# Patient Record
Sex: Female | Born: 1945 | Race: White | Hispanic: No | Smoking: Former smoker
Health system: Southern US, Community
[De-identification: ages and names within clinical notes are randomized; demographics above are authoritative.]

## PROBLEM LIST (undated history)

## (undated) DIAGNOSIS — IMO0001 Reserved for inherently not codable concepts without codable children: Secondary | ICD-10-CM

## (undated) DIAGNOSIS — R918 Other nonspecific abnormal finding of lung field: Secondary | ICD-10-CM

## (undated) DIAGNOSIS — R062 Wheezing: Secondary | ICD-10-CM

## (undated) DIAGNOSIS — G629 Polyneuropathy, unspecified: Secondary | ICD-10-CM

## (undated) DIAGNOSIS — N189 Chronic kidney disease, unspecified: Secondary | ICD-10-CM

## (undated) DIAGNOSIS — I340 Nonrheumatic mitral (valve) insufficiency: Secondary | ICD-10-CM

## (undated) DIAGNOSIS — I2510 Atherosclerotic heart disease of native coronary artery without angina pectoris: Secondary | ICD-10-CM

## (undated) DIAGNOSIS — E785 Hyperlipidemia, unspecified: Secondary | ICD-10-CM

## (undated) DIAGNOSIS — I493 Ventricular premature depolarization: Secondary | ICD-10-CM

## (undated) DIAGNOSIS — I5189 Other ill-defined heart diseases: Secondary | ICD-10-CM

## (undated) DIAGNOSIS — D649 Anemia, unspecified: Secondary | ICD-10-CM

## (undated) DIAGNOSIS — J449 Chronic obstructive pulmonary disease, unspecified: Secondary | ICD-10-CM

## (undated) DIAGNOSIS — M1710 Unilateral primary osteoarthritis, unspecified knee: Secondary | ICD-10-CM

## (undated) DIAGNOSIS — Z8669 Personal history of other diseases of the nervous system and sense organs: Secondary | ICD-10-CM

## (undated) DIAGNOSIS — J45909 Unspecified asthma, uncomplicated: Secondary | ICD-10-CM

## (undated) DIAGNOSIS — I471 Supraventricular tachycardia, unspecified: Secondary | ICD-10-CM

## (undated) DIAGNOSIS — F329 Major depressive disorder, single episode, unspecified: Secondary | ICD-10-CM

## (undated) DIAGNOSIS — M8580 Other specified disorders of bone density and structure, unspecified site: Secondary | ICD-10-CM

## (undated) DIAGNOSIS — M179 Osteoarthritis of knee, unspecified: Secondary | ICD-10-CM

## (undated) DIAGNOSIS — F32A Depression, unspecified: Secondary | ICD-10-CM

## (undated) DIAGNOSIS — N182 Chronic kidney disease, stage 2 (mild): Secondary | ICD-10-CM

## (undated) DIAGNOSIS — R32 Unspecified urinary incontinence: Secondary | ICD-10-CM

## (undated) DIAGNOSIS — F410 Panic disorder [episodic paroxysmal anxiety] without agoraphobia: Secondary | ICD-10-CM

## (undated) DIAGNOSIS — K219 Gastro-esophageal reflux disease without esophagitis: Secondary | ICD-10-CM

## (undated) DIAGNOSIS — I10 Essential (primary) hypertension: Secondary | ICD-10-CM

## (undated) DIAGNOSIS — F419 Anxiety disorder, unspecified: Secondary | ICD-10-CM

## (undated) HISTORY — DX: Chronic kidney disease, unspecified: N18.9

## (undated) HISTORY — PX: BREAST ENHANCEMENT SURGERY: SHX7

## (undated) HISTORY — DX: Chronic obstructive pulmonary disease, unspecified: J44.9

## (undated) HISTORY — DX: Nonrheumatic mitral (valve) insufficiency: I34.0

## (undated) HISTORY — PX: JOINT REPLACEMENT: SHX530

## (undated) HISTORY — DX: Other nonspecific abnormal finding of lung field: R91.8

## (undated) HISTORY — PX: OOPHORECTOMY: SHX86

## (undated) HISTORY — DX: Depression, unspecified: F32.A

## (undated) HISTORY — DX: Osteoarthritis of knee, unspecified: M17.9

## (undated) HISTORY — DX: Polyneuropathy, unspecified: G62.9

## (undated) HISTORY — DX: Other specified disorders of bone density and structure, unspecified site: M85.80

## (undated) HISTORY — DX: Ventricular premature depolarization: I49.3

## (undated) HISTORY — DX: Personal history of other diseases of the nervous system and sense organs: Z86.69

## (undated) HISTORY — DX: Atherosclerotic heart disease of native coronary artery without angina pectoris: I25.10

## (undated) HISTORY — DX: Anemia, unspecified: D64.9

## (undated) HISTORY — DX: Anxiety disorder, unspecified: F41.9

## (undated) HISTORY — PX: INTRAOCULAR LENS INSERTION: SHX110

## (undated) HISTORY — DX: Chronic kidney disease, stage 2 (mild): N18.2

## (undated) HISTORY — DX: Other ill-defined heart diseases: I51.89

## (undated) HISTORY — DX: Supraventricular tachycardia, unspecified: I47.10

## (undated) HISTORY — DX: Essential (primary) hypertension: I10

## (undated) HISTORY — DX: Unilateral primary osteoarthritis, unspecified knee: M17.10

## (undated) HISTORY — DX: Gastro-esophageal reflux disease without esophagitis: K21.9

## (undated) HISTORY — PX: TUBAL LIGATION: SHX77

## (undated) HISTORY — PX: ABDOMINAL HYSTERECTOMY: SHX81

## (undated) HISTORY — DX: Major depressive disorder, single episode, unspecified: F32.9

## (undated) HISTORY — DX: Hyperlipidemia, unspecified: E78.5

---

## 1998-06-11 ENCOUNTER — Ambulatory Visit (HOSPITAL_COMMUNITY): Admission: RE | Admit: 1998-06-11 | Discharge: 1998-06-11 | Payer: Self-pay | Admitting: Neurosurgery

## 1998-06-11 ENCOUNTER — Encounter: Payer: Self-pay | Admitting: Neurosurgery

## 2009-06-13 HISTORY — PX: REPLACEMENT TOTAL KNEE BILATERAL: SUR1225

## 2010-06-10 ENCOUNTER — Ambulatory Visit: Payer: Self-pay | Admitting: Family Medicine

## 2010-06-11 ENCOUNTER — Ambulatory Visit: Payer: Self-pay | Admitting: Family Medicine

## 2010-06-16 ENCOUNTER — Ambulatory Visit: Payer: Self-pay | Admitting: Family Medicine

## 2010-06-21 ENCOUNTER — Ambulatory Visit: Payer: Self-pay | Admitting: Ophthalmology

## 2010-06-29 ENCOUNTER — Ambulatory Visit: Payer: Self-pay | Admitting: Ophthalmology

## 2010-07-01 ENCOUNTER — Ambulatory Visit: Payer: Self-pay | Admitting: General Practice

## 2010-07-14 ENCOUNTER — Inpatient Hospital Stay: Payer: Self-pay | Admitting: General Practice

## 2010-07-19 ENCOUNTER — Emergency Department: Payer: Self-pay | Admitting: Emergency Medicine

## 2010-07-29 ENCOUNTER — Ambulatory Visit: Payer: Self-pay | Admitting: Ophthalmology

## 2010-08-03 ENCOUNTER — Ambulatory Visit: Payer: Self-pay | Admitting: Ophthalmology

## 2010-12-09 ENCOUNTER — Ambulatory Visit: Payer: Self-pay | Admitting: General Practice

## 2010-12-22 ENCOUNTER — Inpatient Hospital Stay: Payer: Self-pay | Admitting: General Practice

## 2011-06-14 HISTORY — PX: EYE SURGERY: SHX253

## 2011-09-06 DIAGNOSIS — M5137 Other intervertebral disc degeneration, lumbosacral region: Secondary | ICD-10-CM | POA: Diagnosis not present

## 2011-12-06 DIAGNOSIS — M545 Low back pain: Secondary | ICD-10-CM | POA: Diagnosis not present

## 2011-12-06 DIAGNOSIS — IMO0002 Reserved for concepts with insufficient information to code with codable children: Secondary | ICD-10-CM | POA: Diagnosis not present

## 2011-12-06 DIAGNOSIS — Z96659 Presence of unspecified artificial knee joint: Secondary | ICD-10-CM | POA: Diagnosis not present

## 2011-12-20 ENCOUNTER — Ambulatory Visit: Payer: Self-pay | Admitting: General Practice

## 2011-12-20 DIAGNOSIS — M47817 Spondylosis without myelopathy or radiculopathy, lumbosacral region: Secondary | ICD-10-CM | POA: Diagnosis not present

## 2011-12-20 DIAGNOSIS — M545 Low back pain, unspecified: Secondary | ICD-10-CM | POA: Diagnosis not present

## 2011-12-20 DIAGNOSIS — M5030 Other cervical disc degeneration, unspecified cervical region: Secondary | ICD-10-CM | POA: Diagnosis not present

## 2011-12-20 DIAGNOSIS — IMO0002 Reserved for concepts with insufficient information to code with codable children: Secondary | ICD-10-CM | POA: Diagnosis not present

## 2011-12-20 DIAGNOSIS — M5137 Other intervertebral disc degeneration, lumbosacral region: Secondary | ICD-10-CM | POA: Diagnosis not present

## 2011-12-20 DIAGNOSIS — M51379 Other intervertebral disc degeneration, lumbosacral region without mention of lumbar back pain or lower extremity pain: Secondary | ICD-10-CM | POA: Diagnosis not present

## 2011-12-20 DIAGNOSIS — N289 Disorder of kidney and ureter, unspecified: Secondary | ICD-10-CM | POA: Diagnosis not present

## 2012-01-05 DIAGNOSIS — M545 Low back pain: Secondary | ICD-10-CM | POA: Diagnosis not present

## 2012-01-10 DIAGNOSIS — IMO0002 Reserved for concepts with insufficient information to code with codable children: Secondary | ICD-10-CM | POA: Diagnosis not present

## 2012-01-16 DIAGNOSIS — M545 Low back pain: Secondary | ICD-10-CM | POA: Diagnosis not present

## 2012-01-18 DIAGNOSIS — M545 Low back pain: Secondary | ICD-10-CM | POA: Diagnosis not present

## 2012-01-23 DIAGNOSIS — M545 Low back pain: Secondary | ICD-10-CM | POA: Diagnosis not present

## 2012-01-25 DIAGNOSIS — M545 Low back pain: Secondary | ICD-10-CM | POA: Diagnosis not present

## 2012-03-06 DIAGNOSIS — I10 Essential (primary) hypertension: Secondary | ICD-10-CM | POA: Diagnosis not present

## 2012-03-06 DIAGNOSIS — F329 Major depressive disorder, single episode, unspecified: Secondary | ICD-10-CM | POA: Diagnosis not present

## 2012-03-06 DIAGNOSIS — Z0000 Encounter for general adult medical examination without abnormal findings: Secondary | ICD-10-CM | POA: Diagnosis not present

## 2012-03-06 DIAGNOSIS — M949 Disorder of cartilage, unspecified: Secondary | ICD-10-CM | POA: Diagnosis not present

## 2012-03-06 DIAGNOSIS — M899 Disorder of bone, unspecified: Secondary | ICD-10-CM | POA: Diagnosis not present

## 2012-03-06 DIAGNOSIS — F411 Generalized anxiety disorder: Secondary | ICD-10-CM | POA: Diagnosis not present

## 2012-03-06 DIAGNOSIS — E7800 Pure hypercholesterolemia, unspecified: Secondary | ICD-10-CM | POA: Diagnosis not present

## 2012-03-06 DIAGNOSIS — Z23 Encounter for immunization: Secondary | ICD-10-CM | POA: Diagnosis not present

## 2012-06-07 ENCOUNTER — Ambulatory Visit: Payer: Self-pay | Admitting: Family Medicine

## 2012-06-07 DIAGNOSIS — Z1231 Encounter for screening mammogram for malignant neoplasm of breast: Secondary | ICD-10-CM | POA: Diagnosis not present

## 2012-07-05 DIAGNOSIS — M5137 Other intervertebral disc degeneration, lumbosacral region: Secondary | ICD-10-CM | POA: Diagnosis not present

## 2012-07-05 DIAGNOSIS — M545 Low back pain: Secondary | ICD-10-CM | POA: Diagnosis not present

## 2012-07-05 DIAGNOSIS — Z23 Encounter for immunization: Secondary | ICD-10-CM | POA: Diagnosis not present

## 2012-07-05 DIAGNOSIS — F329 Major depressive disorder, single episode, unspecified: Secondary | ICD-10-CM | POA: Diagnosis not present

## 2012-07-05 DIAGNOSIS — Z79899 Other long term (current) drug therapy: Secondary | ICD-10-CM | POA: Diagnosis not present

## 2012-07-05 DIAGNOSIS — R072 Precordial pain: Secondary | ICD-10-CM | POA: Diagnosis not present

## 2012-10-25 DIAGNOSIS — Z79899 Other long term (current) drug therapy: Secondary | ICD-10-CM | POA: Diagnosis not present

## 2012-10-25 DIAGNOSIS — Z791 Long term (current) use of non-steroidal anti-inflammatories (NSAID): Secondary | ICD-10-CM | POA: Diagnosis not present

## 2012-10-25 DIAGNOSIS — M5137 Other intervertebral disc degeneration, lumbosacral region: Secondary | ICD-10-CM | POA: Diagnosis not present

## 2012-10-29 DIAGNOSIS — R6884 Jaw pain: Secondary | ICD-10-CM | POA: Diagnosis not present

## 2012-10-29 DIAGNOSIS — M279 Disease of jaws, unspecified: Secondary | ICD-10-CM | POA: Diagnosis not present

## 2013-01-28 DIAGNOSIS — M545 Low back pain: Secondary | ICD-10-CM | POA: Diagnosis not present

## 2013-01-28 DIAGNOSIS — Z0289 Encounter for other administrative examinations: Secondary | ICD-10-CM | POA: Diagnosis not present

## 2013-03-12 ENCOUNTER — Emergency Department: Payer: Self-pay | Admitting: Emergency Medicine

## 2013-03-12 DIAGNOSIS — Z87891 Personal history of nicotine dependence: Secondary | ICD-10-CM | POA: Diagnosis not present

## 2013-03-12 DIAGNOSIS — Z043 Encounter for examination and observation following other accident: Secondary | ICD-10-CM | POA: Diagnosis not present

## 2013-03-12 DIAGNOSIS — M545 Low back pain: Secondary | ICD-10-CM | POA: Diagnosis not present

## 2013-03-12 DIAGNOSIS — T07XXXA Unspecified multiple injuries, initial encounter: Secondary | ICD-10-CM | POA: Diagnosis not present

## 2013-03-12 DIAGNOSIS — M11279 Other chondrocalcinosis, unspecified ankle and foot: Secondary | ICD-10-CM | POA: Diagnosis not present

## 2013-03-12 DIAGNOSIS — T148XXA Other injury of unspecified body region, initial encounter: Secondary | ICD-10-CM | POA: Diagnosis not present

## 2013-03-12 DIAGNOSIS — S139XXA Sprain of joints and ligaments of unspecified parts of neck, initial encounter: Secondary | ICD-10-CM | POA: Diagnosis not present

## 2013-03-21 ENCOUNTER — Emergency Department: Payer: Self-pay | Admitting: Emergency Medicine

## 2013-03-21 DIAGNOSIS — F17200 Nicotine dependence, unspecified, uncomplicated: Secondary | ICD-10-CM | POA: Diagnosis not present

## 2013-03-21 DIAGNOSIS — Z79899 Other long term (current) drug therapy: Secondary | ICD-10-CM | POA: Diagnosis not present

## 2013-03-21 DIAGNOSIS — Z9079 Acquired absence of other genital organ(s): Secondary | ICD-10-CM | POA: Diagnosis not present

## 2013-03-21 DIAGNOSIS — M5430 Sciatica, unspecified side: Secondary | ICD-10-CM | POA: Diagnosis not present

## 2013-03-21 DIAGNOSIS — Z96659 Presence of unspecified artificial knee joint: Secondary | ICD-10-CM | POA: Diagnosis not present

## 2013-03-26 DIAGNOSIS — Z23 Encounter for immunization: Secondary | ICD-10-CM | POA: Diagnosis not present

## 2013-03-26 DIAGNOSIS — M545 Low back pain: Secondary | ICD-10-CM | POA: Diagnosis not present

## 2013-05-01 DIAGNOSIS — M545 Low back pain: Secondary | ICD-10-CM | POA: Diagnosis not present

## 2013-05-01 DIAGNOSIS — E7800 Pure hypercholesterolemia, unspecified: Secondary | ICD-10-CM | POA: Diagnosis not present

## 2013-05-01 DIAGNOSIS — Z0000 Encounter for general adult medical examination without abnormal findings: Secondary | ICD-10-CM | POA: Diagnosis not present

## 2013-08-14 DIAGNOSIS — M545 Low back pain, unspecified: Secondary | ICD-10-CM | POA: Diagnosis not present

## 2013-12-10 DIAGNOSIS — M545 Low back pain, unspecified: Secondary | ICD-10-CM | POA: Diagnosis not present

## 2014-03-06 DIAGNOSIS — F411 Generalized anxiety disorder: Secondary | ICD-10-CM | POA: Diagnosis not present

## 2014-03-06 DIAGNOSIS — IMO0002 Reserved for concepts with insufficient information to code with codable children: Secondary | ICD-10-CM | POA: Diagnosis not present

## 2014-03-06 DIAGNOSIS — Z23 Encounter for immunization: Secondary | ICD-10-CM | POA: Diagnosis not present

## 2014-03-06 DIAGNOSIS — M1710 Unilateral primary osteoarthritis, unspecified knee: Secondary | ICD-10-CM | POA: Diagnosis not present

## 2014-03-06 DIAGNOSIS — M545 Low back pain, unspecified: Secondary | ICD-10-CM | POA: Diagnosis not present

## 2014-03-06 DIAGNOSIS — E7800 Pure hypercholesterolemia, unspecified: Secondary | ICD-10-CM | POA: Diagnosis not present

## 2014-05-28 DIAGNOSIS — E780 Pure hypercholesterolemia: Secondary | ICD-10-CM | POA: Diagnosis not present

## 2014-05-28 DIAGNOSIS — M8580 Other specified disorders of bone density and structure, unspecified site: Secondary | ICD-10-CM | POA: Diagnosis not present

## 2014-05-28 DIAGNOSIS — F341 Dysthymic disorder: Secondary | ICD-10-CM | POA: Diagnosis not present

## 2014-05-28 DIAGNOSIS — F419 Anxiety disorder, unspecified: Secondary | ICD-10-CM | POA: Diagnosis not present

## 2014-05-28 DIAGNOSIS — M961 Postlaminectomy syndrome, not elsewhere classified: Secondary | ICD-10-CM | POA: Diagnosis not present

## 2014-05-28 DIAGNOSIS — I10 Essential (primary) hypertension: Secondary | ICD-10-CM | POA: Diagnosis not present

## 2014-05-28 DIAGNOSIS — Z0000 Encounter for general adult medical examination without abnormal findings: Secondary | ICD-10-CM | POA: Diagnosis not present

## 2014-05-28 DIAGNOSIS — M179 Osteoarthritis of knee, unspecified: Secondary | ICD-10-CM | POA: Diagnosis not present

## 2014-07-21 DIAGNOSIS — G629 Polyneuropathy, unspecified: Secondary | ICD-10-CM | POA: Diagnosis not present

## 2014-07-21 DIAGNOSIS — I10 Essential (primary) hypertension: Secondary | ICD-10-CM | POA: Diagnosis not present

## 2014-07-21 DIAGNOSIS — D649 Anemia, unspecified: Secondary | ICD-10-CM | POA: Diagnosis not present

## 2014-07-21 DIAGNOSIS — R7989 Other specified abnormal findings of blood chemistry: Secondary | ICD-10-CM | POA: Diagnosis not present

## 2014-07-21 DIAGNOSIS — N182 Chronic kidney disease, stage 2 (mild): Secondary | ICD-10-CM | POA: Diagnosis not present

## 2014-08-06 DIAGNOSIS — J0140 Acute pansinusitis, unspecified: Secondary | ICD-10-CM | POA: Diagnosis not present

## 2014-08-06 DIAGNOSIS — R8299 Other abnormal findings in urine: Secondary | ICD-10-CM | POA: Diagnosis not present

## 2014-09-18 DIAGNOSIS — I10 Essential (primary) hypertension: Secondary | ICD-10-CM | POA: Diagnosis not present

## 2014-09-18 DIAGNOSIS — E780 Pure hypercholesterolemia: Secondary | ICD-10-CM | POA: Diagnosis not present

## 2014-09-18 DIAGNOSIS — M961 Postlaminectomy syndrome, not elsewhere classified: Secondary | ICD-10-CM | POA: Diagnosis not present

## 2014-10-21 ENCOUNTER — Encounter: Payer: Self-pay | Admitting: Pain Medicine

## 2014-10-21 ENCOUNTER — Encounter (INDEPENDENT_AMBULATORY_CARE_PROVIDER_SITE_OTHER): Payer: Self-pay

## 2014-10-21 ENCOUNTER — Ambulatory Visit: Payer: Medicare Other | Attending: Pain Medicine | Admitting: Pain Medicine

## 2014-10-21 VITALS — BP 139/64 | HR 85 | Temp 98.0°F | Resp 16 | Ht 64.0 in | Wt 200.0 lb

## 2014-10-21 DIAGNOSIS — M5136 Other intervertebral disc degeneration, lumbar region: Secondary | ICD-10-CM | POA: Diagnosis not present

## 2014-10-21 DIAGNOSIS — M1288 Other specific arthropathies, not elsewhere classified, other specified site: Secondary | ICD-10-CM | POA: Diagnosis not present

## 2014-10-21 DIAGNOSIS — M47899 Other spondylosis, site unspecified: Secondary | ICD-10-CM

## 2014-10-21 DIAGNOSIS — M47818 Spondylosis without myelopathy or radiculopathy, sacral and sacrococcygeal region: Secondary | ICD-10-CM

## 2014-10-21 DIAGNOSIS — I10 Essential (primary) hypertension: Secondary | ICD-10-CM | POA: Insufficient documentation

## 2014-10-21 DIAGNOSIS — Z87891 Personal history of nicotine dependence: Secondary | ICD-10-CM | POA: Diagnosis not present

## 2014-10-21 DIAGNOSIS — M5030 Other cervical disc degeneration, unspecified cervical region: Secondary | ICD-10-CM

## 2014-10-21 DIAGNOSIS — M47819 Spondylosis without myelopathy or radiculopathy, site unspecified: Secondary | ICD-10-CM

## 2014-10-21 DIAGNOSIS — M5481 Occipital neuralgia: Secondary | ICD-10-CM | POA: Diagnosis not present

## 2014-10-21 DIAGNOSIS — M1990 Unspecified osteoarthritis, unspecified site: Secondary | ICD-10-CM | POA: Diagnosis not present

## 2014-10-21 DIAGNOSIS — M461 Sacroiliitis, not elsewhere classified: Secondary | ICD-10-CM | POA: Insufficient documentation

## 2014-10-21 DIAGNOSIS — M542 Cervicalgia: Secondary | ICD-10-CM | POA: Diagnosis present

## 2014-10-21 NOTE — Patient Instructions (Addendum)
Recommend that patient return to referring physician to discuss referral to tertiary pain clinic     Have discussed patient's condition and patient may call next week to discuss further treatment.. As discussed with the patient we may consider treating the patient after review of additional records and discussion of patient's condition. The patient understands that she may need to have further evaluation and treatment at tertiary center after we review additional information and that we may not be able to offer patient treatment.

## 2014-10-21 NOTE — Progress Notes (Signed)
   Subjective:    Patient ID: Patricia Gomez, female    DOB: 01/05/1946, 69 y.o.   MRN: [id number]  HPI    Review of Systems     Objective:   Physical Exam        Assessment & Plan:

## 2014-10-21 NOTE — Progress Notes (Signed)
Patient is a 69 year old female who comes to pain management Center for further evaluation and treatment of pain involving the lumbar extremity region with pain of the cervical region as well a lesser degree. Patient is with prior evaluation and treatment at [facility] and has been under care of her primary care physician Dr. Dossie Arbourrissman for several years after having left [facility]. Patient gives history of having fallen several times which she attributes to slipping off a ladder and other falls due to loss of balance patient states that the pain is an aching cramping sensation with feeling of weight tingling stabbing sharp shooting sensation. Patient states that the pain awakens her from sleep associated with weakness tingling swelling spasms numbness nausea fatigue and nighttime cramps in daytime cramps. Patient states that the pain increases with bending climbing kneeling lifting standing squatting stooping twisting walking uphill and downhill. Pain is improved with walking, packs hot Down and medications. We expressed concern to patient regarding patient's having several falls and recommended that patient be considered for further evaluation at a tertiary pain clinic with additional records and may consider treatment at our pain management Center is discussed with patient on today's visit as well. Additional records and discuss patient's condition and may consider treatment at this time patient is to return to Dr. Dossie Arbourrissman to discuss referral to tertiary pain clinic for evaluation and treatment of her condition.  Allergies no known drug allergies  Medications as listed  Patient Active Problem List   Diagnosis Date Noted  . DDD (degenerative disc disease), cervical 10/21/2014  . DDD (degenerative disc disease), lumbar 10/21/2014  . Facet syndrome 10/21/2014  . Degenerative joint disease of sacroiliac joint 10/21/2014  . Bilateral occipital neuralgia 10/21/2014     Review of  systems  Cardiovascular Daily aspirin, hypertension, chest pain  Pulmonary Lung problems, shortness of breath  Neurological Unremarkable  Psychological Panic attacks, his treatment of having been abused  Gastrointestinal Reflux or heartburn  Genitourinary Kidney disease  Hematological Unremarkable  Endocrine Remarkable  Rheumatological Remarkable  Musculoskeletal Unremarkable  Significant fine unremarkable  Social history  Divorced with 3 children Positive cigarette smoking cessation 5 years  Work history Retired  Physical examination  See vital signs  There was tenderness to palpation in the paraspinal musculature region of the cervical region cervical facet region of mild degree with mild tenderness of the splenius capitis and occipitalis  regions. Palpation of the cervical facet and cervical paraspinal musculature regions reproduced pain of mild to moderate degree. There was mild to moderate tenderness of the acromioclavicular and glenohumeral joint region. Strength type slightly decreased. Tinel's and Phalen's maneuver without increased pain of any significant degree is evidence of muscle spasm in the lower thoracic paraspinal musculature region. Patient over the lumbar paraspinal musculature region lumbar facet region reproduced moderate discomfort. Tendon rotation associated with moderate discomfort. Straight leg raising tolerates approximately 30. No definite increased pain with dorsiflexion noted. Negative clonus negative Homans. Mild tenderness of the knees noticed without increased warmth and erythema of the knees noted. Mild tenderness of the greater trochanteric region noted as well as iliotibial band. Abdomen without tenderness to palpation and no costovertebral tenderness was noted.  Assessment  Degenerative disc disease lumbar spine with facet syndrome  Sacroiliac joint dysfunction  Degenerative joint disease of knees. Prior surgical  intervention  Occipital neuralgia  Plan  At this time recommend patient return to Dr. Dossie Arbourrissman to discuss referral to tertiary pain clinic for further evaluation and treatment. We will  review additional records consider treatment of patient as discussed with patient. At the present time we wish to have patient return to Dr. Dossie Arbourrissman to discuss referral to tertiary pain clinic to avoid delay inpatient straight.. Pending  evaluation by tertiary pain clinic we may consider patient for further treatment in a pain management center. He was understanding and agreement with suggested treatment plan.

## 2014-10-21 NOTE — Progress Notes (Signed)
Patient discharged to home at  1410 hrs.

## 2014-10-23 NOTE — Addendum Note (Signed)
Addended by: Ronan Dion M on: 10/23/2014 12:57 PM   Modules accepted: Orders  

## 2014-11-05 ENCOUNTER — Other Ambulatory Visit: Payer: Self-pay | Admitting: Pain Medicine

## 2014-11-13 ENCOUNTER — Telehealth: Payer: Self-pay

## 2014-11-13 ENCOUNTER — Telehealth: Payer: Self-pay | Admitting: Family Medicine

## 2014-11-13 MED ORDER — DIAZEPAM 5 MG PO TABS: 5.0000 mg | ORAL_TABLET | Freq: Every day | ORAL | Status: DC

## 2014-11-13 NOTE — Telephone Encounter (Signed)
Gave rx refill

## 2014-11-13 NOTE — Telephone Encounter (Signed)
Pt needs her Tramadol with her other 28 day meds.  She is in waiting room.

## 2014-11-13 NOTE — Telephone Encounter (Signed)
Patient would like Refill of Diazepam

## 2014-11-25 ENCOUNTER — Telehealth: Payer: Self-pay | Admitting: Family Medicine

## 2014-12-08 ENCOUNTER — Other Ambulatory Visit: Payer: Self-pay

## 2014-12-09 ENCOUNTER — Other Ambulatory Visit: Payer: Self-pay | Admitting: Family Medicine

## 2014-12-09 MED ORDER — OXYCODONE-ACETAMINOPHEN 5-325 MG PO TABS: 1.0000 | ORAL_TABLET | Freq: Four times a day (QID) | ORAL | Status: DC | PRN

## 2014-12-09 MED ORDER — TRAMADOL HCL 50 MG PO TABS: 50.0000 mg | ORAL_TABLET | Freq: Four times a day (QID) | ORAL | Status: DC | PRN

## 2014-12-16 ENCOUNTER — Encounter: Payer: Self-pay | Admitting: Family Medicine

## 2014-12-16 ENCOUNTER — Ambulatory Visit (INDEPENDENT_AMBULATORY_CARE_PROVIDER_SITE_OTHER): Payer: Medicare Other | Admitting: Family Medicine

## 2014-12-16 VITALS — BP 137/81 | HR 66 | Temp 98.0°F | Ht 62.2 in | Wt 196.0 lb

## 2014-12-16 DIAGNOSIS — N9489 Other specified conditions associated with female genital organs and menstrual cycle: Secondary | ICD-10-CM | POA: Diagnosis not present

## 2014-12-16 DIAGNOSIS — N952 Postmenopausal atrophic vaginitis: Secondary | ICD-10-CM | POA: Diagnosis not present

## 2014-12-16 DIAGNOSIS — N898 Other specified noninflammatory disorders of vagina: Secondary | ICD-10-CM

## 2014-12-16 MED ORDER — ALBUTEROL SULFATE HFA 108 (90 BASE) MCG/ACT IN AERS: 2.0000 | INHALATION_SPRAY | Freq: Four times a day (QID) | RESPIRATORY_TRACT | Status: DC | PRN

## 2014-12-16 MED ORDER — METRONIDAZOLE 500 MG PO TABS: 500.0000 mg | ORAL_TABLET | Freq: Two times a day (BID) | ORAL | Status: DC

## 2014-12-16 NOTE — Patient Instructions (Signed)
Pelvic Pain Pelvic pain is pain felt below the belly button and between your hips. It can be caused by many different things. It is important to get help right away. This is especially true for severe, sharp, or unusual pain that comes on suddenly.  HOME CARE  Only take medicine as told by your doctor.  Rest as told by your doctor.  Eat a healthy diet, such as fruits, vegetables, and lean meats.  Drink enough fluids to keep your pee (urine) clear or pale yellow, or as told.  Avoid sex (intercourse) if it causes pain.  Apply warm or cold packs to your lower belly (abdomen). Use the type of pack that helps the pain.  Avoid situations that cause you stress.  Keep a journal to track your pain. Write down:  When the pain started.  Where it is located.  If there are things that seem to be related to the pain, such as food or your period.  Follow up with your doctor as told. GET HELP RIGHT AWAY IF:   You have heavy bleeding from the vagina.  You have more pelvic pain.  You feel lightheaded or pass out (faint).  You have chills.  You have pain when you pee or have blood in your pee.  You cannot stop having watery poop (diarrhea).  You cannot stop throwing up (vomiting).  You have a fever or lasting symptoms for more than 3 days.  You have a fever and your symptoms suddenly get worse.  You are being physically or sexually abused.  Your medicine does not help your pain.  You have fluid (discharge) coming from your vagina that is not normal. MAKE SURE YOU:  Understand these instructions.  Will watch your condition.  Will get help if you are not doing well or get worse. Document Released: 11/16/2007 Document Revised: 11/29/2011 Document Reviewed: 09/19/2011 Exit[employer] Patient Information 2015 [employer]. This information is not intended to replace advice given to you by your health care provider. Make sure you discuss any questions you have with your health care  provider.  

## 2014-12-16 NOTE — Assessment & Plan Note (Signed)
Likely the cause of her odor and her discomfort. Will try OTC lubricants. If not improving, will consider vaginal estrogen to try to get this under control. Continue to monitor.

## 2014-12-16 NOTE — Progress Notes (Signed)
BP 137/81 mmHg  Pulse 66  Temp(Src) 98 F (36.7 C)  Ht 5' 2.2" (1.58 m)  Wt 196 lb (88.905 kg)  BMI 35.61 kg/m2  SpO2 95%   Subjective:    Patient ID: Patricia Gomez, female    DOB: 10/06/1945, 69 y.o.   MRN: [id number]  HPI: Patricia CorinRebecca Ann Schnurr is a 69 y.o. female  Chief Complaint  Patient presents with  . vaginal odor    X 6months   VAGINAL ATROPHY- feels "like a rose bush" very prickly and painful Duration: 6 months Vaginal dryness: no Vaginal itching: no Dyspareunia: no Vaginal burning: no Vaginal discharge: no Dysuria: no Vaginal bleeding/spotting: no Symptom severity: moderate Urinary frequency: no Urgency: no Hematuria: no Hot flashes: no Fevers: no Vomiting: no Sexual activity: not sexually active History of sexually transmitted disease: no Postmenopause: yes Relief with lubricants: not using Status: severe Treatments attempted: none  Relevant past medical, surgical, family and social history reviewed and updated as indicated. Interim medical history since our last visit reviewed. Allergies and medications reviewed and updated.  Review of Systems  Constitutional: Negative.   Respiratory: Negative.   Cardiovascular: Negative.   Gastrointestinal: Negative.   Genitourinary: Positive for pelvic pain. Negative for dysuria, urgency, frequency, hematuria, flank pain, vaginal bleeding, vaginal discharge, enuresis, difficulty urinating, genital sores, vaginal pain, menstrual problem and dyspareunia.  Psychiatric/Behavioral: Negative.     Per HPI unless specifically indicated above     Objective:    BP 137/81 mmHg  Pulse 66  Temp(Src) 98 F (36.7 C)  Ht 5' 2.2" (1.58 m)  Wt 196 lb (88.905 kg)  BMI 35.61 kg/m2  SpO2 95%  Wt Readings from Last 3 Encounters:  12/16/14 196 lb (88.905 kg)  10/21/14 200 lb (90.719 kg)    Physical Exam  Constitutional: She is oriented to person, place, and time. She appears well-developed and well-nourished. No  distress.  HENT:  Head: Normocephalic and atraumatic.  Right Ear: Hearing normal.  Left Ear: Hearing normal.  Nose: Nose normal.  Eyes: Conjunctivae and lids are normal. Right eye exhibits no discharge. Left eye exhibits no discharge. No scleral icterus.  Pulmonary/Chest: Effort normal. No respiratory distress.  Genitourinary: Pelvic exam was performed with patient supine. There is tenderness in the vagina. No erythema or bleeding in the vagina. No foreign body around the vagina. No signs of injury around the vagina. No vaginal discharge found.  Very dry, atrophic, foul odor  Musculoskeletal: Normal range of motion.  Neurological: She is alert and oriented to person, place, and time.  Skin: Skin is warm, dry and intact. No rash noted. No erythema. No pallor.  Psychiatric: She has a normal mood and affect. Her speech is normal and behavior is normal. Judgment and thought content normal. Cognition and memory are normal.  Nursing note and vitals reviewed.   No results found for this or any previous visit.    Assessment & Plan:   Problem List Items Addressed This Visit      Genitourinary   Vaginal atrophy    Likely the cause of her odor and her discomfort. Will try OTC lubricants. If not improving, will consider vaginal estrogen to try to get this under control. Continue to monitor.        Other Visit Diagnoses    Vaginal odor    -  Primary    Despite negative wet prep, odor will have us treat for BV. Rx sent to her pharmacy today. Call if not getting better or  getting worse.     Relevant Orders    WET PREP FOR TRICH, YEAST, CLUE        Follow up plan: Return for As scheduled with Dr. Dossie Arbour.

## 2014-12-17 LAB — WET PREP FOR TRICH, YEAST, CLUE
Clue Cell Exam: NEGATIVE
TRICHOMONAS EXAM: NEGATIVE
Yeast Exam: NEGATIVE

## 2014-12-26 ENCOUNTER — Telehealth: Payer: Self-pay

## 2014-12-26 NOTE — Telephone Encounter (Signed)
Patient states she is coming in to get her medications but will send her grandson in to pick them up.  Patient states that she has been throwing up all night.

## 2014-12-30 DIAGNOSIS — I10 Essential (primary) hypertension: Secondary | ICD-10-CM | POA: Insufficient documentation

## 2014-12-30 DIAGNOSIS — G629 Polyneuropathy, unspecified: Secondary | ICD-10-CM | POA: Insufficient documentation

## 2014-12-30 DIAGNOSIS — M961 Postlaminectomy syndrome, not elsewhere classified: Secondary | ICD-10-CM | POA: Insufficient documentation

## 2014-12-30 DIAGNOSIS — M8580 Other specified disorders of bone density and structure, unspecified site: Secondary | ICD-10-CM | POA: Insufficient documentation

## 2014-12-30 DIAGNOSIS — F329 Major depressive disorder, single episode, unspecified: Secondary | ICD-10-CM | POA: Insufficient documentation

## 2014-12-30 DIAGNOSIS — D649 Anemia, unspecified: Secondary | ICD-10-CM | POA: Insufficient documentation

## 2014-12-30 DIAGNOSIS — E785 Hyperlipidemia, unspecified: Secondary | ICD-10-CM | POA: Insufficient documentation

## 2014-12-30 DIAGNOSIS — F32A Depression, unspecified: Secondary | ICD-10-CM | POA: Insufficient documentation

## 2014-12-30 DIAGNOSIS — M179 Osteoarthritis of knee, unspecified: Secondary | ICD-10-CM | POA: Insufficient documentation

## 2014-12-30 DIAGNOSIS — M1710 Unilateral primary osteoarthritis, unspecified knee: Secondary | ICD-10-CM | POA: Insufficient documentation

## 2014-12-30 DIAGNOSIS — N189 Chronic kidney disease, unspecified: Secondary | ICD-10-CM | POA: Insufficient documentation

## 2014-12-31 ENCOUNTER — Encounter: Payer: Self-pay | Admitting: Family Medicine

## 2014-12-31 ENCOUNTER — Ambulatory Visit (INDEPENDENT_AMBULATORY_CARE_PROVIDER_SITE_OTHER): Payer: Medicare Other | Admitting: Family Medicine

## 2014-12-31 ENCOUNTER — Other Ambulatory Visit: Payer: Self-pay | Admitting: Family Medicine

## 2014-12-31 VITALS — BP 116/69 | HR 85 | Temp 97.9°F | Ht 62.5 in | Wt 198.0 lb

## 2014-12-31 DIAGNOSIS — M5136 Other intervertebral disc degeneration, lumbar region: Secondary | ICD-10-CM

## 2014-12-31 DIAGNOSIS — I10 Essential (primary) hypertension: Secondary | ICD-10-CM

## 2014-12-31 MED ORDER — TRAMADOL HCL 50 MG PO TABS: 50.0000 mg | ORAL_TABLET | Freq: Three times a day (TID) | ORAL | Status: DC

## 2014-12-31 MED ORDER — OXYCODONE-ACETAMINOPHEN 5-325 MG PO TABS: 1.0000 | ORAL_TABLET | Freq: Four times a day (QID) | ORAL | Status: DC | PRN

## 2014-12-31 NOTE — Assessment & Plan Note (Signed)
The current medical regimen is effective;  continue present plan and medications.  

## 2014-12-31 NOTE — Progress Notes (Signed)
BP 116/69 mmHg  Pulse 85  Temp(Src) 97.9 F (36.6 C)  Ht 5' 2.5" (1.588 m)  Wt 198 lb (89.812 kg)  BMI 35.62 kg/m2  SpO2 99%   Subjective:    Patient ID: Patricia Gomez, female    DOB: 03/21/1946, 69 y.o.   MRN: [id number]  HPI: Patricia CorinRebecca Ann Boxley is a 69 y.o. female  No chief complaint on file.  patient for follow-up chronic pain Patient with multiple gentry joint disease of knees back and neck this been ongoing for some time patient with history of falling intermittently was seen by Dr. crisp at the pain clinic who recommended referral to a tertiary pain clinic to further evaluate. After further evaluation he's indicated may pick up treatment.  Depression is stable for patient Blood pressure is doing good Patient still taking oxycodone 4 times a day and tramadol up to 4 a day and Valium 5 mg once a day when necessary. Relevant past medical, surgical, family and social history reviewed and updated as indicated. Interim medical history since our last visit reviewed. Allergies and medications reviewed and updated.  Review of Systems  Constitutional: Positive for fatigue.  Respiratory: Negative.   Cardiovascular: Negative.     Per HPI unless specifically indicated above     Objective:    BP 116/69 mmHg  Pulse 85  Temp(Src) 97.9 F (36.6 C)  Ht 5' 2.5" (1.588 m)  Wt 198 lb (89.812 kg)  BMI 35.62 kg/m2  SpO2 99%  Wt Readings from Last 3 Encounters:  12/31/14 198 lb (89.812 kg)  09/28/14 195 lb (88.451 kg)  12/16/14 196 lb (88.905 kg)    Physical Exam  Constitutional: She is oriented to person, place, and time. She appears well-developed and well-nourished. No distress.  HENT:  Head: Normocephalic and atraumatic.  Right Ear: Hearing normal.  Left Ear: Hearing normal.  Nose: Nose normal.  Eyes: Conjunctivae and lids are normal. Right eye exhibits no discharge. Left eye exhibits no discharge. No scleral icterus.  Cardiovascular: Normal rate, regular rhythm and  normal heart sounds.   Pulmonary/Chest: Effort normal and breath sounds normal. No respiratory distress.  Musculoskeletal: Normal range of motion.  Neurological: She is alert and oriented to person, place, and time.  Skin: Skin is intact. No rash noted.  Psychiatric: She has a normal mood and affect. Her speech is normal and behavior is normal. Judgment and thought content normal. Cognition and memory are normal.    Results for orders placed or performed in visit on 12/16/14  WET PREP FOR TRICH, YEAST, CLUE  Result Value Ref Range   Trichomonas Exam Negative Negative   Yeast Exam Negative Negative   Clue Cell Exam Negative Negative      Assessment & Plan:   Problem List Items Addressed This Visit      Cardiovascular and Mediastinum   Hypertension - Primary    The current medical regimen is effective;  continue present plan and medications.         Musculoskeletal and Integument   DDD (degenerative disc disease), lumbar    Discussed chronic pain and medication Discussed following an probability of being overdosed on medications.  Discussed need to referral to pain clinic pain [facility] Discuss Will cut back on Valium from 4 a day to 3 a day Discuss Will cut back tramadol from 4 day to 3 a day We will continue oxycodone at 4 a day for now Follow-up in one month In one  month we'll anticipate cutting back further on medications including oxycodone We'll continue this process until patient was picked up by pain clinic at [facility]       Relevant Orders   Ambulatory referral to Pain Clinic       Follow up plan: Return in about 4 weeks (around 01/28/2015) for Recheck pain and taper of medications.

## 2014-12-31 NOTE — Assessment & Plan Note (Signed)
Discussed chronic pain and medication Discussed following an probability of being overdosed on medications.  Discussed need to referral to pain clinic pain patient [facility] Discuss Will cut back on Valium from 4 a day to 3 a day Discuss Will cut back tramadol from 4 day to 3 a day We will continue oxycodone at 4 a day for now Follow-up in one month In one month we'll anticipate cutting back further on medications including oxycodone We'll continue this process until patient was picked up by [facility]

## 2015-01-01 ENCOUNTER — Other Ambulatory Visit: Payer: Medicare Other

## 2015-01-01 ENCOUNTER — Telehealth: Payer: Self-pay | Admitting: Family Medicine

## 2015-01-01 ENCOUNTER — Other Ambulatory Visit: Payer: Self-pay | Admitting: Family Medicine

## 2015-01-01 DIAGNOSIS — R109 Unspecified abdominal pain: Secondary | ICD-10-CM

## 2015-01-01 DIAGNOSIS — R300 Dysuria: Secondary | ICD-10-CM

## 2015-01-01 LAB — URINALYSIS, ROUTINE W REFLEX MICROSCOPIC
Bilirubin, UA: NEGATIVE
Glucose, UA: NEGATIVE
KETONES UA: NEGATIVE
Nitrite, UA: NEGATIVE
Protein, UA: NEGATIVE
Specific Gravity, UA: 1.01 (ref 1.005–1.030)
UUROB: 0.2 mg/dL (ref 0.2–1.0)
pH, UA: 7.5 (ref 5.0–7.5)

## 2015-01-01 LAB — MICROSCOPIC EXAMINATION

## 2015-01-01 MED ORDER — CIPROFLOXACIN HCL 250 MG PO TABS: 250.0000 mg | ORAL_TABLET | Freq: Two times a day (BID) | ORAL | Status: DC

## 2015-01-01 NOTE — Telephone Encounter (Signed)
Pt called stated the smell of her urine is so strong she can't stand it and that her stomach hurts. Pt stated she was seen yesterday, she wants to know if she can come in to have her urine tested. Please call pt ASAP. Thanks.

## 2015-01-01 NOTE — Telephone Encounter (Signed)
Pt stopped in stated she is not able to get to the pain clinic in [facility], her car is old and not in good shape and her kids have said they wont take her.  She said you can just wean her off and give her something non-narcotic.  She was quite upset, crying at my desk.  Please call her to advise.

## 2015-01-01 NOTE — Telephone Encounter (Signed)
Patient will come in for UA

## 2015-01-05 ENCOUNTER — Other Ambulatory Visit: Payer: Self-pay | Admitting: Family Medicine

## 2015-01-06 NOTE — Telephone Encounter (Signed)
OK to wait for Dr. Dossie Arbour to return.

## 2015-01-07 ENCOUNTER — Telehealth: Payer: Self-pay

## 2015-01-07 NOTE — Telephone Encounter (Signed)
Patient states she is finishing her Rx of Cipro but her urine is still "stinky" I told her [facility] was on vacation and she would need to come in and see another provider if she felt she needed additional medication.  Patient refused appointment stating she would wait for [facility]'s return

## 2015-01-13 ENCOUNTER — Telehealth: Payer: Self-pay | Admitting: Family Medicine

## 2015-01-13 NOTE — Telephone Encounter (Signed)
E-fax came through for new prescription: Rx: Benzodiazepine Copy in basket

## 2015-01-13 NOTE — Telephone Encounter (Signed)
Not a request

## 2015-01-17 NOTE — Telephone Encounter (Signed)
Call pt 

## 2015-01-19 MED ORDER — ALBUTEROL SULFATE HFA 108 (90 BASE) MCG/ACT IN AERS: 2.0000 | INHALATION_SPRAY | Freq: Four times a day (QID) | RESPIRATORY_TRACT | Status: DC | PRN

## 2015-01-19 MED ORDER — METRONIDAZOLE 500 MG PO TABS: 500.0000 mg | ORAL_TABLET | Freq: Two times a day (BID) | ORAL | Status: DC

## 2015-01-19 NOTE — Telephone Encounter (Signed)
Phone call Discussed with patient unable to drive to get pain clinic patient decided to taper off and stopped pain medications which she is successfully doing

## 2015-02-02 ENCOUNTER — Encounter: Payer: Self-pay | Admitting: Family Medicine

## 2015-02-02 ENCOUNTER — Ambulatory Visit (INDEPENDENT_AMBULATORY_CARE_PROVIDER_SITE_OTHER): Payer: Medicare Other | Admitting: Family Medicine

## 2015-02-02 VITALS — BP 137/78 | HR 80 | Temp 97.5°F | Ht 62.5 in | Wt 198.0 lb

## 2015-02-02 DIAGNOSIS — I10 Essential (primary) hypertension: Secondary | ICD-10-CM

## 2015-02-02 DIAGNOSIS — M539 Dorsopathy, unspecified: Secondary | ICD-10-CM | POA: Diagnosis not present

## 2015-02-02 DIAGNOSIS — M961 Postlaminectomy syndrome, not elsewhere classified: Secondary | ICD-10-CM

## 2015-02-02 MED ORDER — DIAZEPAM 5 MG PO TABS: 5.0000 mg | ORAL_TABLET | Freq: Every day | ORAL | Status: DC

## 2015-02-02 MED ORDER — OXYCODONE-ACETAMINOPHEN 5-325 MG PO TABS
1.0000 | ORAL_TABLET | Freq: Three times a day (TID) | ORAL | Status: DC | PRN
Start: 2015-02-02 — End: 2015-03-03

## 2015-02-02 MED ORDER — TRAMADOL HCL 50 MG PO TABS: 50.0000 mg | ORAL_TABLET | Freq: Three times a day (TID) | ORAL | Status: DC

## 2015-02-02 NOTE — Assessment & Plan Note (Signed)
The current medical regimen is effective;  continue present plan and medications.  

## 2015-02-02 NOTE — Assessment & Plan Note (Addendum)
Discussed chronic pain and care will continue Valium 5 mg 3 a day We'll continue tramadol 50 mg 3 a day Percocet has been taking 4 a day we'll decrease to 3 a day We will continue this pattern of alternating months discontinue and stop these control drugs. Any time patient wants pain treatment will refer to pain clinic. Patient decided she does want to see someone else. We will go ahead and make referral to local pain clinic.

## 2015-02-02 NOTE — Progress Notes (Signed)
BP 137/78 mmHg  Pulse 80  Temp(Src) 97.5 F (36.4 C)  Ht 5' 2.5" (1.588 m)  Wt 198 lb (89.812 kg)  BMI 35.62 kg/m2  SpO2 96%   Subjective:    Patient ID: Patricia Gomez, female    DOB: November 22, 1945, 69 y.o.   MRN: [id number]  HPI: Patricia Gomez is a 69 y.o. female  Chief Complaint  Patient presents with  . Pain   patient follow-up medication has cut back on Valium and is taking 3 a day has done okay Has cut back on tramadol to 3 a day and done okay. Has continued oxycodone at 4 a day.  A she did have one spell of severe pain that went away with urination.  Blood pressure stayed okay No issues with any medications. Relevant past medical, surgical, family and social history reviewed and updated as indicated. Interim medical history since our last visit reviewed. Allergies and medications reviewed and updated.  Review of Systems  Constitutional: Negative.   Respiratory: Negative.   Cardiovascular: Negative.     Per HPI unless specifically indicated above     Objective:    BP 137/78 mmHg  Pulse 80  Temp(Src) 97.5 F (36.4 C)  Ht 5' 2.5" (1.588 m)  Wt 198 lb (89.812 kg)  BMI 35.62 kg/m2  SpO2 96%  Wt Readings from Last 3 Encounters:  02/02/15 198 lb (89.812 kg)  12/31/14 198 lb (89.812 kg)  09/28/14 195 lb (88.451 kg)    Physical Exam  Constitutional: She is oriented to person, place, and time. She appears well-developed and well-nourished. No distress.  HENT:  Head: Normocephalic and atraumatic.  Right Ear: Hearing normal.  Left Ear: Hearing normal.  Nose: Nose normal.  Eyes: Conjunctivae and lids are normal. Right eye exhibits no discharge. Left eye exhibits no discharge. No scleral icterus.  Cardiovascular: Normal rate, regular rhythm and normal heart sounds.   Pulmonary/Chest: Effort normal and breath sounds normal. No respiratory distress.  Musculoskeletal: Normal range of motion.  Neurological: She is alert and oriented to person, place, and  time.  Skin: Skin is intact. No rash noted.  Psychiatric: She has a normal mood and affect. Her speech is normal and behavior is normal. Judgment and thought content normal. Cognition and memory are normal.    Results for orders placed or performed in visit on 01/01/15  Microscopic Examination  Result Value Ref Range   WBC, UA 6-10 (A) 0 -  5 /hpf   RBC, UA 3-10 (A) 0 -  2 /hpf   Epithelial Cells (non renal) 0-10 0 - 10 /hpf   Bacteria, UA Few None seen/Few   Yeast, UA Present (A) None seen  Urinalysis, Routine w reflex microscopic  Result Value Ref Range   Specific Gravity, UA 1.010 1.005 - 1.030   pH, UA 7.5 5.0 - 7.5   Color, UA Yellow Yellow   Appearance Ur Cloudy (A) Clear   Leukocytes, UA 3+ (A) Negative   Protein, UA Negative Negative/Trace   Glucose, UA Negative Negative   Ketones, UA Negative Negative   RBC, UA 1+ (A) Negative   Bilirubin, UA Negative Negative   Urobilinogen, Ur 0.2 0.2 - 1.0 mg/dL   Nitrite, UA Negative Negative   Microscopic Examination See below:       Assessment & Plan:   Problem List Items Addressed This Visit      Cardiovascular and Mediastinum   Hypertension - Primary    The current medical regimen is  effective;  continue present plan and medications.         Other   Failed back surgical syndrome    Discussed chronic pain and care will continue Valium 5 mg 3 a day We'll continue tramadol 50 mg 3 a day Percocet has been taking 4 a day we'll decrease to 3 a day We will continue this pattern of alternating months discontinue and stop these control drugs. Any time patient wants pain treatment will refer to pain clinic. Patient decided she does want to see someone else. We will go ahead and make referral to local pain clinic.      Relevant Medications   diazepam (VALIUM) 5 MG tablet   traMADol (ULTRAM) 50 MG tablet   oxyCODONE-acetaminophen (PERCOCET/ROXICET) 5-325 MG per tablet   Other Relevant Orders   Ambulatory referral to Pain  Clinic       Follow up plan: Return in about 4 weeks (around 03/02/2015), or if symptoms worsen or fail to improve.

## 2015-03-03 ENCOUNTER — Other Ambulatory Visit: Payer: Self-pay | Admitting: Family Medicine

## 2015-03-03 DIAGNOSIS — M961 Postlaminectomy syndrome, not elsewhere classified: Secondary | ICD-10-CM

## 2015-03-03 MED ORDER — OXYCODONE-ACETAMINOPHEN 5-325 MG PO TABS
1.0000 | ORAL_TABLET | Freq: Three times a day (TID) | ORAL | Status: DC | PRN
Start: 2015-03-03 — End: 2015-03-30

## 2015-03-03 MED ORDER — DIAZEPAM 5 MG PO TABS: 5.0000 mg | ORAL_TABLET | Freq: Every day | ORAL | Status: DC

## 2015-03-03 MED ORDER — TRAMADOL HCL 50 MG PO TABS
50.0000 mg | ORAL_TABLET | Freq: Three times a day (TID) | ORAL | Status: DC
Start: 2015-03-03 — End: 2015-03-30

## 2015-03-05 ENCOUNTER — Telehealth: Payer: Self-pay | Admitting: Family Medicine

## 2015-03-05 ENCOUNTER — Encounter: Payer: Self-pay | Admitting: Family Medicine

## 2015-03-05 ENCOUNTER — Ambulatory Visit (INDEPENDENT_AMBULATORY_CARE_PROVIDER_SITE_OTHER): Payer: Medicare Other | Admitting: Family Medicine

## 2015-03-05 VITALS — BP 116/69 | HR 68 | Temp 97.7°F | Ht 61.5 in | Wt 193.0 lb

## 2015-03-05 DIAGNOSIS — Z23 Encounter for immunization: Secondary | ICD-10-CM | POA: Diagnosis not present

## 2015-03-05 DIAGNOSIS — M539 Dorsopathy, unspecified: Secondary | ICD-10-CM

## 2015-03-05 DIAGNOSIS — M961 Postlaminectomy syndrome, not elsewhere classified: Secondary | ICD-10-CM

## 2015-03-05 DIAGNOSIS — N952 Postmenopausal atrophic vaginitis: Secondary | ICD-10-CM

## 2015-03-05 MED ORDER — ESTRADIOL 0.1 MG/GM VA CREA: 1.0000 | TOPICAL_CREAM | VAGINAL | Status: DC

## 2015-03-05 NOTE — Telephone Encounter (Signed)
Clarified 1 Tab TID with pharmacy

## 2015-03-05 NOTE — Progress Notes (Signed)
BP 116/69 mmHg  Pulse 68  Temp(Src) 97.7 F (36.5 C)  Ht 5' 1.5" (1.562 m)  Wt 193 lb (87.544 kg)  BMI 35.88 kg/m2  SpO2 97%   Subjective:    Patient ID: Patricia Gomez, female    DOB: [birth year], 69 y.o.   MRN: [id number]  HPI: Patricia Gomez is a 69 y.o. female  Chief Complaint  Patient presents with  . Pain  . foul smelling urine   discussed with patient has had multiple rounds of metronidazole with no real relief in symptoms No pain no bleeding just really distinct from her vaginal area. Is very concerning.  Patient's chronic pain and poor control had been cutting back medications. And has had some contacts from pain clinics but unwilling to drive out of town. Discussed Will continue reducing patient's medication dose.  Relevant past medical, surgical, family and social history reviewed and updated as indicated. Interim medical history since our last visit reviewed. Allergies and medications reviewed and updated.  Review of Systems  Constitutional: Negative.   Respiratory: Negative.   Cardiovascular: Negative.     Per HPI unless specifically indicated above     Objective:    BP 116/69 mmHg  Pulse 68  Temp(Src) 97.7 F (36.5 C)  Ht 5' 1.5" (1.562 m)  Wt 193 lb (87.544 kg)  BMI 35.88 kg/m2  SpO2 97%  Wt Readings from Last 3 Encounters:  03/05/15 193 lb (87.544 kg)  02/02/15 198 lb (89.812 kg)  12/31/14 198 lb (89.812 kg)    Physical Exam  Constitutional: She is oriented to person, place, and time. She appears well-developed and well-nourished. No distress.  HENT:  Head: Normocephalic and atraumatic.  Right Ear: Hearing normal.  Left Ear: Hearing normal.  Nose: Nose normal.  Eyes: Conjunctivae and lids are normal. Right eye exhibits no discharge. Left eye exhibits no discharge. No scleral icterus.  Cardiovascular: Normal rate, regular rhythm and normal heart sounds.   Pulmonary/Chest: Effort normal and breath sounds normal. No respiratory  distress.  Musculoskeletal: Normal range of motion.  Neurological: She is alert and oriented to person, place, and time.  Skin: Skin is intact. No rash noted.  Psychiatric: She has a normal mood and affect. Her speech is normal and behavior is normal. Judgment and thought content normal. Cognition and memory are normal.    Results for orders placed or performed in visit on 01/01/15  Microscopic Examination  Result Value Ref Range   WBC, UA 6-10 (A) 0 -  5 /hpf   RBC, UA 3-10 (A) 0 -  2 /hpf   Epithelial Cells (non renal) 0-10 0 - 10 /hpf   Bacteria, UA Few None seen/Few   Yeast, UA Present (A) None seen  Urinalysis, Routine w reflex microscopic  Result Value Ref Range   Specific Gravity, UA 1.010 1.005 - 1.030   pH, UA 7.5 5.0 - 7.5   Color, UA Yellow Yellow   Appearance Ur Cloudy (A) Clear   Leukocytes, UA 3+ (A) Negative   Protein, UA Negative Negative/Trace   Glucose, UA Negative Negative   Ketones, UA Negative Negative   RBC, UA 1+ (A) Negative   Bilirubin, UA Negative Negative   Urobilinogen, Ur 0.2 0.2 - 1.0 mg/dL   Nitrite, UA Negative Negative   Microscopic Examination See below:       Assessment & Plan:   Problem List Items Addressed This Visit      Genitourinary   Vaginal atrophy  For patient's vaginal atrophy and odor will try Estrace cream. Discussed slow use and slow insertion over the weeks apply small amount 2-3 times a week.      Relevant Medications   estradiol (ESTRACE VAGINAL) 0.1 MG/GM vaginal cream     Other   Failed back surgical syndrome    Chronic pain we'll continue reducing medications next month go to twice a day for Percocet and Valium       Other Visit Diagnoses    Immunization due    -  Primary    Relevant Orders    Flu Vaccine QUAD 36+ mos PF IM (Fluarix & Fluzone Quad PF) (Completed)        Follow up plan: Return in about 3 months (around 06/04/2015), or if symptoms worsen or fail to improve, for Follow-up pain management and  taper.

## 2015-03-05 NOTE — Telephone Encounter (Signed)
[facility] called stated they need clarification on the RX for Tramadol. Please call ASAP.

## 2015-03-05 NOTE — Assessment & Plan Note (Signed)
For patient's vaginal atrophy and odor will try Estrace cream. Discussed slow use and slow insertion over the weeks apply small amount 2-3 times a week.

## 2015-03-05 NOTE — Assessment & Plan Note (Signed)
Chronic pain we'll continue reducing medications next month go to twice a day for Percocet and Valium

## 2015-03-30 ENCOUNTER — Other Ambulatory Visit: Payer: Self-pay | Admitting: Family Medicine

## 2015-03-30 DIAGNOSIS — M961 Postlaminectomy syndrome, not elsewhere classified: Secondary | ICD-10-CM

## 2015-03-30 MED ORDER — TRAMADOL HCL 50 MG PO TABS: 50.0000 mg | ORAL_TABLET | Freq: Three times a day (TID) | ORAL | Status: DC

## 2015-03-30 MED ORDER — OXYCODONE-ACETAMINOPHEN 5-325 MG PO TABS: 1.0000 | ORAL_TABLET | Freq: Three times a day (TID) | ORAL | Status: DC | PRN

## 2015-03-30 MED ORDER — DIAZEPAM 5 MG PO TABS: 5.0000 mg | ORAL_TABLET | Freq: Every day | ORAL | Status: DC

## 2015-04-06 ENCOUNTER — Other Ambulatory Visit: Payer: Self-pay | Admitting: Family Medicine

## 2015-04-06 DIAGNOSIS — Z1231 Encounter for screening mammogram for malignant neoplasm of breast: Secondary | ICD-10-CM

## 2015-04-07 ENCOUNTER — Encounter: Payer: Self-pay | Admitting: Family Medicine

## 2015-04-07 ENCOUNTER — Ambulatory Visit (INDEPENDENT_AMBULATORY_CARE_PROVIDER_SITE_OTHER): Payer: Medicare Other | Admitting: Family Medicine

## 2015-04-07 VITALS — BP 96/59 | HR 76 | Temp 97.8°F | Ht 61.8 in | Wt 197.0 lb

## 2015-04-07 DIAGNOSIS — I10 Essential (primary) hypertension: Secondary | ICD-10-CM

## 2015-04-07 DIAGNOSIS — M5030 Other cervical disc degeneration, unspecified cervical region: Secondary | ICD-10-CM

## 2015-04-07 NOTE — Assessment & Plan Note (Signed)
The current medical regimen is effective;  continue present plan and medications.  

## 2015-04-07 NOTE — Progress Notes (Signed)
BP 96/59 mmHg  Pulse 76  Temp(Src) 97.8 F (36.6 C)  Ht 5' 1.8" (1.57 m)  Wt 197 lb (89.359 kg)  BMI 36.25 kg/m2  SpO2 95%   Subjective:    Patient ID: Patricia Gomez, female    DOB: 03/05/1946, 69 y.o.   MRN: [id number]  HPI: Patricia Gomez is a 69 y.o. female  Chief Complaint  Patient presents with  . Anxiety    with great deal of anxiety over sediment of her mother's estate  and multiple issues but these are chronic  Patient with continued pain has appointment next month at the pain clinic. Blood pressure doing well is actually somewhat low today.    Relevant past medical, surgical, family and social history reviewed and updated as indicated. Interim medical history since our last visit reviewed. Allergies and medications reviewed and updated.  Review of Systems  Constitutional: Negative.   Respiratory: Negative.   Cardiovascular: Negative.     Per HPI unless specifically indicated above     Objective:    BP 96/59 mmHg  Pulse 76  Temp(Src) 97.8 F (36.6 C)  Ht 5' 1.8" (1.57 m)  Wt 197 lb (89.359 kg)  BMI 36.25 kg/m2  SpO2 95%  Wt Readings from Last 3 Encounters:  04/07/15 197 lb (89.359 kg)  03/05/15 193 lb (87.544 kg)  02/02/15 198 lb (89.812 kg)    Physical Exam  Constitutional: She is oriented to person, place, and time. She appears well-developed and well-nourished. No distress.  HENT:  Head: Normocephalic and atraumatic.  Right Ear: Hearing normal.  Left Ear: Hearing normal.  Nose: Nose normal.  Eyes: Conjunctivae and lids are normal. Right eye exhibits no discharge. Left eye exhibits no discharge. No scleral icterus.  Cardiovascular: Normal rate, regular rhythm and normal heart sounds.   Pulmonary/Chest: Effort normal and breath sounds normal. No respiratory distress.  Musculoskeletal: Normal range of motion.  Neurological: She is alert and oriented to person, place, and time.  Skin: Skin is intact. No rash noted.  Psychiatric: She has  a normal mood and affect. Her speech is normal and behavior is normal. Judgment and thought content normal. Cognition and memory are normal.    Results for orders placed or performed in visit on 01/01/15  Microscopic Examination  Result Value Ref Range   WBC, UA 6-10 (A) 0 -  5 /hpf   RBC, UA 3-10 (A) 0 -  2 /hpf   Epithelial Cells (non renal) 0-10 0 - 10 /hpf   Bacteria, UA Few None seen/Few   Yeast, UA Present (A) None seen  Urinalysis, Routine w reflex microscopic  Result Value Ref Range   Specific Gravity, UA 1.010 1.005 - 1.030   pH, UA 7.5 5.0 - 7.5   Color, UA Yellow Yellow   Appearance Ur Cloudy (A) Clear   Leukocytes, UA 3+ (A) Negative   Protein, UA Negative Negative/Trace   Glucose, UA Negative Negative   Ketones, UA Negative Negative   RBC, UA 1+ (A) Negative   Bilirubin, UA Negative Negative   Urobilinogen, Ur 0.2 0.2 - 1.0 mg/dL   Nitrite, UA Negative Negative   Microscopic Examination See below:       Assessment & Plan:   Problem List Items Addressed This Visit      Cardiovascular and Mediastinum   Hypertension - Primary    The current medical regimen is effective;  continue present plan and medications.         Musculoskeletal  and Integument   DDD (degenerative disc disease), cervical    Multiple painful states Has taken pain medicine stable and appointment with pain clinic for further evaluation and management of patient's chronic pain.         Discuss pain care Discuss use of medications risk of fall Will continue current medications until transfer to pain clinic.  Follow up plan: Return for Physical Exam Dec.

## 2015-04-07 NOTE — Assessment & Plan Note (Signed)
Multiple painful states Has taken pain medicine stable and appointment with pain clinic for further evaluation and management of patient's chronic pain.

## 2015-04-08 ENCOUNTER — Other Ambulatory Visit: Payer: Self-pay

## 2015-04-08 DIAGNOSIS — Z1239 Encounter for other screening for malignant neoplasm of breast: Secondary | ICD-10-CM

## 2015-04-08 DIAGNOSIS — Z9882 Breast implant status: Secondary | ICD-10-CM

## 2015-04-09 DIAGNOSIS — M654 Radial styloid tenosynovitis [de Quervain]: Secondary | ICD-10-CM | POA: Diagnosis not present

## 2015-04-09 DIAGNOSIS — M25532 Pain in left wrist: Secondary | ICD-10-CM | POA: Diagnosis not present

## 2015-04-09 DIAGNOSIS — M542 Cervicalgia: Secondary | ICD-10-CM | POA: Diagnosis not present

## 2015-04-09 DIAGNOSIS — M4722 Other spondylosis with radiculopathy, cervical region: Secondary | ICD-10-CM | POA: Diagnosis not present

## 2015-04-27 ENCOUNTER — Other Ambulatory Visit: Payer: Self-pay | Admitting: Family Medicine

## 2015-04-28 ENCOUNTER — Other Ambulatory Visit: Payer: Self-pay | Admitting: Family Medicine

## 2015-04-28 DIAGNOSIS — M961 Postlaminectomy syndrome, not elsewhere classified: Secondary | ICD-10-CM

## 2015-04-28 MED ORDER — OXYCODONE-ACETAMINOPHEN 5-325 MG PO TABS: 1.0000 | ORAL_TABLET | Freq: Three times a day (TID) | ORAL | Status: DC | PRN

## 2015-04-28 MED ORDER — TRAMADOL HCL 50 MG PO TABS: 50.0000 mg | ORAL_TABLET | Freq: Three times a day (TID) | ORAL | Status: DC

## 2015-04-28 MED ORDER — DIAZEPAM 5 MG PO TABS: 5.0000 mg | ORAL_TABLET | Freq: Every day | ORAL | Status: DC

## 2015-04-29 DIAGNOSIS — H26492 Other secondary cataract, left eye: Secondary | ICD-10-CM | POA: Diagnosis not present

## 2015-05-01 DIAGNOSIS — M1812 Unilateral primary osteoarthritis of first carpometacarpal joint, left hand: Secondary | ICD-10-CM | POA: Diagnosis not present

## 2015-05-01 DIAGNOSIS — M189 Osteoarthritis of first carpometacarpal joint, unspecified: Secondary | ICD-10-CM | POA: Insufficient documentation

## 2015-05-04 ENCOUNTER — Telehealth: Payer: Self-pay | Admitting: Family Medicine

## 2015-05-04 DIAGNOSIS — M961 Postlaminectomy syndrome, not elsewhere classified: Secondary | ICD-10-CM

## 2015-05-04 MED ORDER — AZITHROMYCIN 250 MG PO TABS: ORAL_TABLET | ORAL | Status: DC

## 2015-05-04 MED ORDER — DIAZEPAM 5 MG PO TABS: 5.0000 mg | ORAL_TABLET | Freq: Three times a day (TID) | ORAL | Status: DC | PRN

## 2015-05-04 NOTE — Telephone Encounter (Signed)
Pt states she did not get all of the valium she normally gets.  She also stated she needs an antibiotic she is blowing and coughing up "green" stuff.

## 2015-05-04 NOTE — Addendum Note (Signed)
Addended byVonita Moss: Eddy Termine on: 05/04/2015 12:15 PM   Modules accepted: Orders

## 2015-05-06 ENCOUNTER — Ambulatory Visit (INDEPENDENT_AMBULATORY_CARE_PROVIDER_SITE_OTHER): Payer: Medicare Other | Admitting: Family Medicine

## 2015-05-06 ENCOUNTER — Encounter: Payer: Self-pay | Admitting: Family Medicine

## 2015-05-06 VITALS — BP 108/68 | HR 74 | Temp 98.2°F | Ht 62.2 in | Wt 194.0 lb

## 2015-05-06 DIAGNOSIS — J4520 Mild intermittent asthma, uncomplicated: Secondary | ICD-10-CM

## 2015-05-06 DIAGNOSIS — IMO0001 Reserved for inherently not codable concepts without codable children: Secondary | ICD-10-CM

## 2015-05-06 DIAGNOSIS — J45909 Unspecified asthma, uncomplicated: Secondary | ICD-10-CM | POA: Insufficient documentation

## 2015-05-06 DIAGNOSIS — J449 Chronic obstructive pulmonary disease, unspecified: Secondary | ICD-10-CM | POA: Diagnosis not present

## 2015-05-06 NOTE — Progress Notes (Signed)
   BP 108/68 mmHg  Pulse 74  Temp(Src) 98.2 F (36.8 C)  Ht 5' 2.2" (1.58 m)  Wt 194 lb (87.998 kg)  BMI 35.25 kg/m2  SpO2 95%   Subjective:    Patient ID: Patricia Gomez, female    DOB: 03/23/1946, 69 y.o.   MRN: [id number]  HPI: Patricia Gomez is a 69 y.o. female  Chief Complaint  Patient presents with  . surgical clearance   patient having left wrist surgery due to multiple different arthritis  Patient uses albuterol on a when necessary basis for occasional shortness of breath Patient states albuterol allows her to get a good breath Patient on chart review from our old chart with a spirometry in 2007 that was normal with no evidence of COPD Epic chart system lists a history of COPD Patient is quit smoking 5 years ago Prior to that smoked for years and years.  Patient with no cardiovascular complaints otherwise  Relevant past medical, surgical, family and social history reviewed and updated as indicated. Interim medical history since our last visit reviewed. Allergies and medications reviewed and updated.  Review of Systems  Constitutional: Negative.   Respiratory: Positive for cough and wheezing. Negative for shortness of breath and stridor.        With cough uses inhaler and  Cough stops  Cardiovascular: Negative.     Per HPI unless specifically indicated above     Objective:    BP 108/68 mmHg  Pulse 74  Temp(Src) 98.2 F (36.8 C)  Ht 5' 2.2" (1.58 m)  Wt 194 lb (87.998 kg)  BMI 35.25 kg/m2  SpO2 95%  Wt Readings from Last 3 Encounters:  05/06/15 194 lb (87.998 kg)  04/07/15 197 lb (89.359 kg)  03/05/15 193 lb (87.544 kg)    Physical Exam  Constitutional: She is oriented to person, place, and time. She appears well-developed and well-nourished. No distress.  HENT:  Head: Normocephalic and atraumatic.  Right Ear: Hearing normal.  Left Ear: Hearing normal.  Nose: Nose normal.  Eyes: Conjunctivae and lids are normal. Right eye exhibits no  discharge. Left eye exhibits no discharge. No scleral icterus.  Cardiovascular: Normal rate, regular rhythm and normal heart sounds.   Pulmonary/Chest: Effort normal. No respiratory distress. She has no wheezes. She has no rales. She exhibits no tenderness.  Musculoskeletal: Normal range of motion.  Neurological: She is alert and oriented to person, place, and time.  Skin: Skin is intact. No rash noted.  Psychiatric: She has a normal mood and affect. Her speech is normal and behavior is normal. Judgment and thought content normal. Cognition and memory are normal.        Assessment & Plan:   Problem List Items Addressed This Visit      Respiratory   Asthma    Patient surprisingly with normal spirometry No COPD Symptoms and response to medication consistent with asthma Patient to use albuterol inhaler prior to surgery Patient is cleared for surgery       Other Visit Diagnoses    COPD bronchitis    -  Primary    Relevant Orders    Spirometry: Pre & Post Eval (Completed)        Follow up plan: Return if symptoms worsen or fail to improve, for As scheduled.

## 2015-05-06 NOTE — Assessment & Plan Note (Signed)
Patient surprisingly with normal spirometry No COPD Symptoms and response to medication consistent with asthma Patient to use albuterol inhaler prior to surgery Patient is cleared for surgery

## 2015-05-15 ENCOUNTER — Encounter
Admission: RE | Admit: 2015-05-15 | Discharge: 2015-05-15 | Disposition: A | Discharge status: Home / Self Care | Payer: Medicare Other | Source: Ambulatory Visit | Attending: Surgery | Admitting: Surgery

## 2015-05-15 DIAGNOSIS — Z01810 Encounter for preprocedural cardiovascular examination: Secondary | ICD-10-CM | POA: Insufficient documentation

## 2015-05-15 DIAGNOSIS — I10 Essential (primary) hypertension: Secondary | ICD-10-CM | POA: Diagnosis not present

## 2015-05-15 DIAGNOSIS — Z01812 Encounter for preprocedural laboratory examination: Secondary | ICD-10-CM | POA: Diagnosis not present

## 2015-05-15 HISTORY — DX: Unspecified asthma, uncomplicated: J45.909

## 2015-05-15 HISTORY — DX: Wheezing: R06.2

## 2015-05-15 HISTORY — DX: Panic disorder (episodic paroxysmal anxiety): F41.0

## 2015-05-15 HISTORY — DX: Reserved for inherently not codable concepts without codable children: IMO0001

## 2015-05-15 HISTORY — DX: Unspecified urinary incontinence: R32

## 2015-05-15 LAB — BASIC METABOLIC PANEL
Anion gap: 8 (ref 5–15)
BUN: 11 mg/dL (ref 6–20)
CHLORIDE: 104 mmol/L (ref 101–111)
CO2: 25 mmol/L (ref 22–32)
CREATININE: 1.18 mg/dL — AB (ref 0.44–1.00)
Calcium: 8.7 mg/dL — ABNORMAL LOW (ref 8.9–10.3)
GFR calc non Af Amer: 46 mL/min — ABNORMAL LOW (ref >60)
GFR, EST AFRICAN AMERICAN: 53 mL/min — AB (ref >60)
Glucose, Bld: 119 mg/dL — ABNORMAL HIGH (ref 65–99)
Potassium: 4 mmol/L (ref 3.5–5.1)
Sodium: 137 mmol/L (ref 135–145)

## 2015-05-15 LAB — CBC
HCT: 31.3 % — ABNORMAL LOW (ref 35.0–47.0)
Hemoglobin: 10.3 g/dL — ABNORMAL LOW (ref 12.0–16.0)
MCH: 26 pg (ref 26.0–34.0)
MCHC: 32.9 g/dL (ref 32.0–36.0)
MCV: 78.9 fL — ABNORMAL LOW (ref 80.0–100.0)
Platelets: 343 10*3/uL (ref 150–440)
RBC: 3.97 MIL/uL (ref 3.80–5.20)
RDW: 15.8 % — ABNORMAL HIGH (ref 11.5–14.5)
WBC: 8.8 10*3/uL (ref 3.6–11.0)

## 2015-05-15 NOTE — Patient Instructions (Addendum)
  Your procedure is scheduled on: 05/21/15 Thurs  Report to Day Surgery.2nd floor medical mall To find out your arrival time please call [phone] between 1PM - 3PM on 05/20/15 Wed.  Remember: Instructions that are not followed completely may result in serious medical risk, up to and including death, or upon the discretion of your surgeon and anesthesiologist your surgery may need to be rescheduled.    _x___ 1. Do not eat food or drink liquids after midnight. No gum chewing or hard candies.     ____ 2. No Alcohol for 24 hours before or after surgery.   ____ 3. Bring all medications with you on the day of surgery if instructed.    __x_ 4. Notify your doctor if there is any change in your medical condition     (cold, fever, infections).     Do not wear jewelry, make-up, hairpins, clips or nail polish.  Do not wear lotions, powders, or perfumes. You may wear deodorant.  Do not shave 48 hours prior to surgery. Men may shave face and neck.  Do not bring valuables to the hospital.    [facility] any belongings or valuables.               Contacts, dentures or bridgework may not be worn into surgery.  Leave your suitcase in the car. After surgery it may be brought to your room.  For patients admitted to the hospital, discharge time is determined by your                treatment team.   Patients discharged the day of surgery will not be allowed to drive home.   Please read over the following fact sheets that you were given:      __x__ Take these medicines the morning of surgery with A SIP OF WATER:    1. albuterol (PROVENTIL HFA;VENTOLIN HFA) 108 (90 BASE) MCG/ACT inhaler  2. benazepril (LOTENSIN) 40 MG tablet  3. metoprolol succinate (TOPROL-XL) 25 MG 24 hr tablet  4.omeprazole (PRILOSEC) 20 MG capsule  5.oxyCODONE-acetaminophen (PERCOCET/ROXICET) 5-325 MG tablet if needed  6.  ____ Fleet Enema (as directed)   _x___ Use CHG Soap as directed  __x__ Use  inhalers on the day of surgery  ____ Stop metformin 2 days prior to surgery    ____ Take 1/2 of usual insulin dose the night before surgery and none on the morning of surgery.   _x___ Stop Coumadin/Plavix/aspirin on Stop Aspirin today   __x__ Stop Anti-inflammatories on today may use Tylenol   ____ Stop supplements until after surgery.    ____ Bring C-Pap to the hospital.

## 2015-05-21 ENCOUNTER — Ambulatory Visit: Payer: Medicare Other | Admitting: Certified Registered Nurse Anesthetist

## 2015-05-21 ENCOUNTER — Ambulatory Visit: Payer: Medicare Other

## 2015-05-21 ENCOUNTER — Ambulatory Visit
Admission: RE | Admit: 2015-05-21 | Discharge: 2015-05-21 | Disposition: A | Discharge status: Home / Self Care | Payer: Medicare Other | Source: Ambulatory Visit | Attending: Surgery | Admitting: Surgery

## 2015-05-21 ENCOUNTER — Encounter
Admission: RE | Disposition: A | Discharge status: Home / Self Care | Payer: Self-pay | Source: Ambulatory Visit | Attending: Surgery

## 2015-05-21 ENCOUNTER — Encounter: Payer: Self-pay | Admitting: Surgery

## 2015-05-21 DIAGNOSIS — M67439 Ganglion, unspecified wrist: Secondary | ICD-10-CM | POA: Insufficient documentation

## 2015-05-21 DIAGNOSIS — Z791 Long term (current) use of non-steroidal anti-inflammatories (NSAID): Secondary | ICD-10-CM | POA: Diagnosis not present

## 2015-05-21 DIAGNOSIS — G43909 Migraine, unspecified, not intractable, without status migrainosus: Secondary | ICD-10-CM | POA: Insufficient documentation

## 2015-05-21 DIAGNOSIS — M778 Other enthesopathies, not elsewhere classified: Secondary | ICD-10-CM | POA: Diagnosis not present

## 2015-05-21 DIAGNOSIS — I10 Essential (primary) hypertension: Secondary | ICD-10-CM | POA: Diagnosis not present

## 2015-05-21 DIAGNOSIS — M67432 Ganglion, left wrist: Secondary | ICD-10-CM | POA: Insufficient documentation

## 2015-05-21 DIAGNOSIS — R7981 Abnormal blood-gas level: Secondary | ICD-10-CM

## 2015-05-21 DIAGNOSIS — J449 Chronic obstructive pulmonary disease, unspecified: Secondary | ICD-10-CM | POA: Diagnosis not present

## 2015-05-21 DIAGNOSIS — K219 Gastro-esophageal reflux disease without esophagitis: Secondary | ICD-10-CM | POA: Diagnosis not present

## 2015-05-21 DIAGNOSIS — M549 Dorsalgia, unspecified: Secondary | ICD-10-CM | POA: Diagnosis not present

## 2015-05-21 DIAGNOSIS — R0902 Hypoxemia: Secondary | ICD-10-CM | POA: Diagnosis not present

## 2015-05-21 DIAGNOSIS — M71332 Other bursal cyst, left wrist: Secondary | ICD-10-CM | POA: Diagnosis not present

## 2015-05-21 DIAGNOSIS — G8929 Other chronic pain: Secondary | ICD-10-CM | POA: Diagnosis not present

## 2015-05-21 DIAGNOSIS — Z79899 Other long term (current) drug therapy: Secondary | ICD-10-CM | POA: Diagnosis not present

## 2015-05-21 DIAGNOSIS — M542 Cervicalgia: Secondary | ICD-10-CM | POA: Diagnosis not present

## 2015-05-21 DIAGNOSIS — D649 Anemia, unspecified: Secondary | ICD-10-CM | POA: Diagnosis not present

## 2015-05-21 DIAGNOSIS — M1812 Unilateral primary osteoarthritis of first carpometacarpal joint, left hand: Secondary | ICD-10-CM | POA: Diagnosis not present

## 2015-05-21 DIAGNOSIS — Z7982 Long term (current) use of aspirin: Secondary | ICD-10-CM | POA: Diagnosis not present

## 2015-05-21 DIAGNOSIS — Z87891 Personal history of nicotine dependence: Secondary | ICD-10-CM | POA: Insufficient documentation

## 2015-05-21 HISTORY — PX: GANGLION CYST EXCISION: SHX1691

## 2015-05-21 HISTORY — PX: CARPOMETACARPAL (CMC) FUSION OF THUMB: SHX6290

## 2015-05-21 SURGERY — CARPOMETACARPAL (CMC) FUSION OF THUMB
Anesthesia: General | Site: Wrist | Laterality: Left | Wound class: Clean

## 2015-05-21 MED ORDER — IPRATROPIUM-ALBUTEROL 0.5-2.5 (3) MG/3ML IN SOLN
RESPIRATORY_TRACT | Status: AC
  Administered 2015-05-21: 3 mL via RESPIRATORY_TRACT
  Filled 2015-05-21: qty 3

## 2015-05-21 MED ORDER — DEXAMETHASONE SODIUM PHOSPHATE 4 MG/ML IJ SOLN
INTRAMUSCULAR | Status: DC | PRN
  Administered 2015-05-21: 4 mg via INTRAVENOUS

## 2015-05-21 MED ORDER — LIDOCAINE HCL (CARDIAC) 20 MG/ML IV SOLN
INTRAVENOUS | Status: DC | PRN
  Administered 2015-05-21: 100 mg via INTRAVENOUS

## 2015-05-21 MED ORDER — LACTATED RINGERS IV SOLN: INTRAVENOUS | Status: DC

## 2015-05-21 MED ORDER — ONDANSETRON HCL 4 MG/2ML IJ SOLN
INTRAMUSCULAR | Status: DC | PRN
  Administered 2015-05-21: 4 mg via INTRAVENOUS

## 2015-05-21 MED ORDER — LACTATED RINGERS IV SOLN
INTRAVENOUS | Status: DC
  Administered 2015-05-21 (×2): via INTRAVENOUS

## 2015-05-21 MED ORDER — BUPIVACAINE HCL (PF) 0.5 % IJ SOLN
INTRAMUSCULAR | Status: AC
  Filled 2015-05-21: qty 30

## 2015-05-21 MED ORDER — CLINDAMYCIN PHOSPHATE 900 MG/50ML IV SOLN
INTRAVENOUS | Status: AC
  Administered 2015-05-21: 900 mg via INTRAVENOUS
  Filled 2015-05-21: qty 50

## 2015-05-21 MED ORDER — ACETAMINOPHEN 10 MG/ML IV SOLN
INTRAVENOUS | Status: DC | PRN
  Administered 2015-05-21: 1000 mg via INTRAVENOUS

## 2015-05-21 MED ORDER — HYDROMORPHONE HCL 1 MG/ML IJ SOLN: 0.2500 mg | INTRAMUSCULAR | Status: DC | PRN

## 2015-05-21 MED ORDER — ALBUTEROL SULFATE (2.5 MG/3ML) 0.083% IN NEBU
2.5000 mg | INHALATION_SOLUTION | Freq: Once | RESPIRATORY_TRACT | Status: AC
  Administered 2015-05-21: 2.5 mg via RESPIRATORY_TRACT

## 2015-05-21 MED ORDER — PHENYLEPHRINE HCL 10 MG/ML IJ SOLN
INTRAMUSCULAR | Status: DC | PRN
  Administered 2015-05-21: 100 ug via INTRAVENOUS

## 2015-05-21 MED ORDER — MIDAZOLAM HCL 2 MG/2ML IJ SOLN
INTRAMUSCULAR | Status: DC | PRN
  Administered 2015-05-21: 2 mg via INTRAVENOUS

## 2015-05-21 MED ORDER — FENTANYL CITRATE (PF) 100 MCG/2ML IJ SOLN
25.0000 ug | INTRAMUSCULAR | Status: DC | PRN
  Administered 2015-05-21 (×2): 25 ug via INTRAVENOUS

## 2015-05-21 MED ORDER — IPRATROPIUM-ALBUTEROL 0.5-2.5 (3) MG/3ML IN SOLN
3.0000 mL | Freq: Once | RESPIRATORY_TRACT | Status: AC
  Administered 2015-05-21: 3 mL via RESPIRATORY_TRACT

## 2015-05-21 MED ORDER — BUPIVACAINE HCL (PF) 0.5 % IJ SOLN
INTRAMUSCULAR | Status: DC | PRN
  Administered 2015-05-21: 10 mL

## 2015-05-21 MED ORDER — FENTANYL CITRATE (PF) 100 MCG/2ML IJ SOLN
INTRAMUSCULAR | Status: AC
  Administered 2015-05-21: 25 ug via INTRAVENOUS
  Filled 2015-05-21: qty 2

## 2015-05-21 MED ORDER — ONDANSETRON HCL 4 MG/2ML IJ SOLN: 4.0000 mg | Freq: Once | INTRAMUSCULAR | Status: DC | PRN

## 2015-05-21 MED ORDER — OXYCODONE HCL 5 MG PO TABS
5.0000 mg | ORAL_TABLET | ORAL | Status: DC | PRN
Start: 2015-05-21 — End: 2015-08-20

## 2015-05-21 MED ORDER — IPRATROPIUM-ALBUTEROL 0.5-2.5 (3) MG/3ML IN SOLN
3.0000 mL | Freq: Four times a day (QID) | RESPIRATORY_TRACT | Status: DC
  Administered 2015-05-21: 3 mL via RESPIRATORY_TRACT

## 2015-05-21 MED ORDER — ACETAMINOPHEN 10 MG/ML IV SOLN
INTRAVENOUS | Status: AC
Start: 2015-05-21 — End: 2015-05-21
  Filled 2015-05-21: qty 100

## 2015-05-21 MED ORDER — FUROSEMIDE 10 MG/ML IJ SOLN
20.0000 mg | Freq: Once | INTRAMUSCULAR | Status: AC
  Administered 2015-05-21: 20 mg via INTRAVENOUS

## 2015-05-21 MED ORDER — FENTANYL CITRATE (PF) 100 MCG/2ML IJ SOLN
INTRAMUSCULAR | Status: DC | PRN
  Administered 2015-05-21 (×5): 25 ug via INTRAVENOUS

## 2015-05-21 MED ORDER — NEOMYCIN-POLYMYXIN B GU 40-200000 IR SOLN
Status: DC | PRN
  Administered 2015-05-21: 2 mL

## 2015-05-21 MED ORDER — PROPOFOL 10 MG/ML IV BOLUS
INTRAVENOUS | Status: DC | PRN
  Administered 2015-05-21: 160 mg via INTRAVENOUS

## 2015-05-21 MED ORDER — FUROSEMIDE 10 MG/ML IJ SOLN
INTRAMUSCULAR | Status: AC
  Administered 2015-05-21: 20 mg via INTRAVENOUS
  Filled 2015-05-21: qty 2

## 2015-05-21 MED ORDER — METOPROLOL TARTRATE 1 MG/ML IV SOLN
INTRAVENOUS | Status: DC | PRN
  Administered 2015-05-21 (×2): 1 mg via INTRAVENOUS

## 2015-05-21 MED ORDER — ACETAMINOPHEN 10 MG/ML IV SOLN
INTRAVENOUS | Status: DC | PRN
Start: 2015-05-21 — End: 2015-05-21

## 2015-05-21 MED ORDER — CLINDAMYCIN PHOSPHATE 900 MG/50ML IV SOLN: 900.0000 mg | Freq: Once | INTRAVENOUS | Status: DC

## 2015-05-21 SURGICAL SUPPLY — 45 items
BANDAGE ACE 3X5.8 VEL STRL LF (GAUZE/BANDAGES/DRESSINGS) ×4 IMPLANT
BANDAGE STRETCH 3X4.1 STRL (GAUZE/BANDAGES/DRESSINGS) ×4 IMPLANT
BLADE DEBAKEY 8.0 (BLADE) ×2 IMPLANT
BLADE DEBAKEY 8.0MM (BLADE)
BLADE OSC/SAGITTAL 5.5X25 (BLADE) ×2 IMPLANT
BLADE SURG SZ12 CARB STEEL (BLADE) ×8 IMPLANT
BNDG COHESIVE 4X5 TAN STRL (GAUZE/BANDAGES/DRESSINGS) ×4 IMPLANT
BNDG ESMARK 4X12 TAN STRL LF (GAUZE/BANDAGES/DRESSINGS) ×4 IMPLANT
BUR 3.0X8 (BURR) ×2 IMPLANT
BUR SURG RND 4.0X8 FLTD (BURR) ×2 IMPLANT
CAST PADDING 3X4FT ST 30246 (SOFTGOODS) ×4
CLOSURE WOUND 1/2 X4 (GAUZE/BANDAGES/DRESSINGS) ×1
DRAPE FLUOR MINI C-ARM 54X84 (DRAPES) ×4 IMPLANT
DURAPREP 26ML APPLICATOR (WOUND CARE) ×6 IMPLANT
FORCEPS JEWEL BIP 4-3/4 STR (INSTRUMENTS) ×4 IMPLANT
GAUZE PETRO XEROFOAM 1X8 (MISCELLANEOUS) ×4 IMPLANT
GLOVE BIO SURGEON STRL SZ8 (GLOVE) ×8 IMPLANT
GLOVE INDICATOR 8.0 STRL GRN (GLOVE) ×4 IMPLANT
GOWN STRL REUS W/ TWL LRG LVL3 (GOWN DISPOSABLE) ×2 IMPLANT
GOWN STRL REUS W/ TWL XL LVL3 (GOWN DISPOSABLE) ×2 IMPLANT
GOWN STRL REUS W/TWL LRG LVL3 (GOWN DISPOSABLE) ×4
GOWN STRL REUS W/TWL XL LVL3 (GOWN DISPOSABLE) ×4
KIT RM TURNOVER STRD PROC AR (KITS) ×4 IMPLANT
NDL HYPO 25X1 1.5 SAFETY (NEEDLE) ×2 IMPLANT
NDL KEITH SZ2.5 (NEEDLE) ×4 IMPLANT
NEEDLE HYPO 25X1 1.5 SAFETY (NEEDLE) ×4 IMPLANT
NS IRRIG 500ML POUR BTL (IV SOLUTION) ×4 IMPLANT
PACK EXTREMITY ARMC (MISCELLANEOUS) ×4 IMPLANT
PAD CAST CTTN 3X4 STRL (SOFTGOODS) ×4 IMPLANT
PADDING CAST COTTON 3X4 STRL (SOFTGOODS) ×4
PASSER SUT SWANSON 36MM LOOP (INSTRUMENTS) ×2 IMPLANT
SPLINT WRIST M LT TX990308 (SOFTGOODS) ×4 IMPLANT
STOCKINETTE 48X4 2 PLY STRL (GAUZE/BANDAGES/DRESSINGS) ×2 IMPLANT
STOCKINETTE IMPERVIOUS 9X36 MD (GAUZE/BANDAGES/DRESSINGS) ×4 IMPLANT
STOCKINETTE STRL 4IN 9604848 (GAUZE/BANDAGES/DRESSINGS) ×4 IMPLANT
STRIP CLOSURE SKIN 1/2X4 (GAUZE/BANDAGES/DRESSINGS) ×3 IMPLANT
SUT ETHIBOND 0 MO6 C/R (SUTURE) ×4 IMPLANT
SUT PROLENE 4 0 PS 2 18 (SUTURE) ×4 IMPLANT
SUT VIC AB 2-0 CT2 27 (SUTURE) ×4 IMPLANT
SUT VIC AB 2-0 SH 27 (SUTURE)
SUT VIC AB 2-0 SH 27XBRD (SUTURE) ×2 IMPLANT
SUT VIC AB 3-0 SH 27 (SUTURE) ×4
SUT VIC AB 3-0 SH 27X BRD (SUTURE) ×2 IMPLANT
SYRINGE 10CC LL (SYRINGE) ×4 IMPLANT
WIRE Z .062 C-WIRE SPADE TIP (WIRE) ×4 IMPLANT

## 2015-05-21 NOTE — Op Note (Addendum)
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  Post-Op Diagnosis:   Same.  Procedure:   1. Interposition arthroplasty left thumb CMC joint.                         2. Excision of left volar carpal ganglion.  Surgeon:   Maryagnes Amos, MD  Assistant:   None  Anesthesia:   General LMA  Findings:   As above.  Complications:   None  EBL:   5 cc  Fluids:   1000 cc crystalloid  TT:   89 minutes at 250 mmHg  Drains:   None  Closure:   3-0 Vicryl subcuticular sutures  Implants:   None  Brief Clinical Note:   The patient is a 69 year old female with a long history of progressively worsening left thumb pain. Her symptoms have progressed despite medications, activity modification, etc. Her history and examination were consistent with degenerative joint disease of the left thumb CMC joint. The patient presents at this time for an interposition arthroplasty of the left thumb CMC joint. The patient also complains of a current swelling in the volar aspect of her radial left wrist. Her history and examination consistent with a volar carpal ganglion. She would like to have the cyst removed as well.  Procedure:    The patient was brought into the operating room and lain in the supine position. After adequate general laryngal mask anesthesia was obtained, the patient's left hand and upper extremity were prepped with ChloraPrep solution before being draped sterilely. Preoperative antibiotics were administered. After verifying the appropriate surgical site with a timeout, the left hand and upper extremity were exsanguinated with an Esmarch and the tourniquet inflated to 250 mmHg. An approximately 6 cm curvilinear incision was made over the radial aspect of the thumb CMC joint, extending into the volar aspect of the wrist. The incision was carried down through the subcutaneous cutaneous tissues  with care taken to identify and protect the local neurovascular structures. Several small veins were cauterized with bipolar electrocautery. The volar cyst was approached first. Careful dissection demonstrated the cyst to be emanating from the flexor carpi radialis tendon sheath. The cyst was removed. The underlying tendon demonstrated moderate tendinopathy changes.  Attention was directed to the thumb CMC joint. A longitudinal incision was made through the capsular tissues in over the dorsal aspect of the thumb CMC joint. Subperiosteal dissection was carried out to expose the trapezium dorsally and volarly. Once the [facility] and scaphotrapezial joints were identified, a sagittal cut was made in the trapezium using the micro-oscillating saw. This cut extended approximately two thirds down through the bone. A Hoke osteotome was used to complete transection of the bone. The bone was then removed piecemeal using rongeurs and further dissection. The flexor carpi radialis tendon was identified at the base of the defect. This portion of the tendon was noted to be quite tendinopathic as well with an approximately 50% degenerative tear of the tendon. The base of the thumb metacarpal was prepared by transecting the articular portion with the micro-oscillating saw particular to the long axis of the metacarpal. Using a small bur, a hole was drilled into the dorsal cortex, leaving a 1 cm bridge of bone. This was connected to the proximal end of the metacarpal. A #0 Ethibond suture was placed through the capsular tissues at the  base of the defect in preparation for receiving the flexor carpi radialis tendon.  Attention was directed proximally to the mid-volar forearm. The flexor carpi radialis tendon was identified by palpation. An approximately 2 cm incision was made transversely over this tendon near the muscle tendon junction. The incision was carried down through the subcutaneous tissues to expose the flexor carpi radialis  tendon. The tendinous portion was transected using electrocautery after pulling it up into the wound with a small retractor.   Attention was redirected to the thumb CMC defect site. The flexor carpi radialis tendon was brought into the wound. Several areas of degeneration were debrided sharply with small scissors before a whipstitch was placed into the proximal end of the tendon. This was used to pull the tendon through the base of the metacarpal and back on itself where it was tied securely with another #0 Ethibond suture. The free ends of the suture that had been placed at the base of the wound were passed through the remaining portion of the flexor carpi radialis tendon, "anchovying it", then tying the sutures securely to pull the wadded up tendon into the defect.   The wound was copiously irrigated with sterile saline solution using bulb irrigation before the capsular tissues were reapproximated using 2-0 Vicryl interrupted sutures, incorporating portions of the tendon to further reinforce the repair. The skin was closed using 3-0 Vicryl subcuticular interrupted sutures. The volar forearm wound also was closed using 3-0 Vicryl subcuticular interrupted sutures. Benzoin and Steri-Strips were applied to the skin of both wounds before a total of 10 cc of 0.5% plain Sensorcaine was injected in and around both incisions to help with postoperative analgesia. A sterile bulky dressing was applied to the wounds before the patient was placed into a volar forearm thumb spica plaster splint maintaining the thumb in the position of function. The patient was then awakened, extubated, and returned to the recovery room in satisfactory condition after tolerating the procedure well.

## 2015-05-21 NOTE — Progress Notes (Signed)
Sat down to 89  albuteral given for low sat  Lungs clear but a little sleepy

## 2015-05-21 NOTE — H&P (Signed)
Paper H&P to be scanned into permanent record. H&P reviewed. No changes. 

## 2015-05-21 NOTE — Anesthesia Postprocedure Evaluation (Signed)
Anesthesia Post Note  Patient: Patricia Gomez  Procedure(s) Performed: Procedure(s) (LRB): CARPOMETACARPAL (CMC) FUSION OF THUMB (Left) REMOVAL GANGLION OF WRIST (Left)  Patient location during evaluation: PACU Anesthesia Type: General Level of consciousness: awake and alert Pain management: pain level controlled Vital Signs Assessment: post-procedure vital signs reviewed and stable Respiratory status: spontaneous breathing, nonlabored ventilation, respiratory function stable and patient connected to nasal cannula oxygen (sats low 90s and sp B2 tx and diuresis yielding improved status . Pt reports no sob and ready for discharge) Cardiovascular status: blood pressure returned to baseline and stable Postop Assessment: no signs of nausea or vomiting Anesthetic complications: no Comments: As above.  cxr reviewed and pt ok for discharge     Last Vitals:  Filed Vitals:   05/21/15 1214 05/21/15 1230  BP: 134/47 131/58  Pulse: 86 84  Temp:    Resp: 16 21    Last Pain:  Filed Vitals:   05/21/15 1235  PainSc: 0-No pain                 Yevette EdwardsJames G Casmer Yepiz

## 2015-05-21 NOTE — Progress Notes (Signed)
States her stomach  Hurts   States she has thing at home  Lungs clear   Sat on room air 966

## 2015-05-21 NOTE — Transfer of Care (Signed)
Immediate Anesthesia Transfer of Care Note  Patient: Patricia Gomez  Procedure(s) Performed: Procedure(s): CARPOMETACARPAL (CMC) FUSION OF THUMB (Left) REMOVAL GANGLION OF WRIST (Left)  Patient Location: PACU  Anesthesia Type:General  Level of Consciousness: sedated  Airway & Oxygen Therapy: Patient Spontanous Breathing and Patient connected to face mask oxygen  Post-op Assessment: Report given to RN and Post -op Vital signs reviewed and stable  Post vital signs: Reviewed and stable  Last Vitals:  Filed Vitals:   05/21/15 0631  BP: 143/65  Temp: 36.7 C  Resp: 14    Complications: No apparent anesthesia complications

## 2015-05-21 NOTE — Anesthesia Preprocedure Evaluation (Signed)
Anesthesia Evaluation  Patient identified by MRN, date of birth, ID band Patient awake    Reviewed: Allergy & Precautions, H&P , NPO status , Patient's Chart, lab work & pertinent test results, reviewed documented beta blocker date and time   Airway Mallampati: III  TM Distance: >3 FB Neck ROM: full    Dental  (+) Teeth Intact, Edentulous Upper, Edentulous Lower   Pulmonary neg pulmonary ROS, shortness of breath and with exertion, asthma , former smoker,    Pulmonary exam normal        Cardiovascular Exercise Tolerance: Good hypertension, negative cardio ROS Normal cardiovascular exam Rate:Normal     Neuro/Psych  Headaches, PSYCHIATRIC DISORDERS Hx neuropathy and anxiety and panic attacks negative neurological ROS  negative psych ROS   GI/Hepatic negative GI ROS, Neg liver ROS, GERD  ,  Endo/Other  negative endocrine ROS  Renal/GU Renal diseasenegative Renal ROS  negative genitourinary   Musculoskeletal   Abdominal   Peds  Hematology negative hematology ROS (+) anemia ,   Anesthesia Other Findings   Reproductive/Obstetrics negative OB ROS                             Anesthesia Physical Anesthesia Plan  ASA: III  Anesthesia Plan: General LMA   Post-op Pain Management:    Induction:   Airway Management Planned:   Additional Equipment:   Intra-op Plan:   Post-operative Plan:   Informed Consent: I have reviewed the patients History and Physical, chart, labs and discussed the procedure including the risks, benefits and alternatives for the proposed anesthesia with the patient or authorized representative who has indicated his/her understanding and acceptance.     Plan Discussed with: CRNA  Anesthesia Plan Comments:         Anesthesia Quick Evaluation

## 2015-05-21 NOTE — Discharge Instructions (Addendum)
Keep splint dry and intact. °Keep hand elevated above heart level. °Apply ice to affected area frequently. °Return for follow-up in 10-14 days or as scheduled. °

## 2015-05-25 LAB — SURGICAL PATHOLOGY

## 2015-05-26 ENCOUNTER — Other Ambulatory Visit: Payer: Self-pay | Admitting: Family Medicine

## 2015-05-27 ENCOUNTER — Ambulatory Visit (INDEPENDENT_AMBULATORY_CARE_PROVIDER_SITE_OTHER): Payer: Medicare Other | Admitting: Family Medicine

## 2015-05-27 ENCOUNTER — Encounter: Payer: Self-pay | Admitting: Family Medicine

## 2015-05-27 VITALS — BP 126/71 | HR 71 | Temp 97.8°F | Ht 62.2 in | Wt 202.0 lb

## 2015-05-27 DIAGNOSIS — M961 Postlaminectomy syndrome, not elsewhere classified: Secondary | ICD-10-CM

## 2015-05-27 DIAGNOSIS — M539 Dorsopathy, unspecified: Secondary | ICD-10-CM | POA: Diagnosis not present

## 2015-05-27 DIAGNOSIS — I10 Essential (primary) hypertension: Secondary | ICD-10-CM | POA: Diagnosis not present

## 2015-05-27 MED ORDER — TRAMADOL HCL 50 MG PO TABS: 50.0000 mg | ORAL_TABLET | Freq: Three times a day (TID) | ORAL | Status: DC

## 2015-05-27 MED ORDER — METOPROLOL SUCCINATE ER 25 MG PO TB24: ORAL_TABLET | ORAL | Status: DC

## 2015-05-27 MED ORDER — BENAZEPRIL HCL 40 MG PO TABS: 40.0000 mg | ORAL_TABLET | Freq: Every day | ORAL | Status: DC

## 2015-05-27 NOTE — Progress Notes (Signed)
BP 126/71 mmHg  Pulse 71  Temp(Src) 97.8 F (36.6 C)  Ht 5' 2.2" (1.58 m)  Wt 202 lb (91.627 kg)  BMI 36.70 kg/m2  SpO2 94%   Subjective:    Patient ID: Patricia Gomez, female    DOB: February 05, 1946, 69 y.o.   MRN: [id number]  HPI: Patricia Gomez is a 69 y.o. female  Chief Complaint  Patient presents with  . Pain    12 week, would like to change to Percocet   Patient reviewed notes from Dr. crisp who is unable to take on management of the patient's pain until Medical Center evaluation. After adequate evaluation at the pain clinic at a Medical Center's note states he would be willing to take on the patient's case and manage her medicine. Review patient's notes for details from the pain clinic  Patient having a tough time has left hand surgery hand secondary cast and splint seems very painful will need adjustment of pain medicine. Patient still seeing orthopedics who is writing her current pain medicines  Patient's blood pressure doing okay wants to cut back on that medication  Relevant past medical, surgical, family and social history reviewed and updated as indicated. Interim medical history since our last visit reviewed. Allergies and medications reviewed and updated.  Review of Systems  Constitutional: Negative.   Respiratory: Negative.   Cardiovascular: Negative.     Per HPI unless specifically indicated above     Objective:    BP 126/71 mmHg  Pulse 71  Temp(Src) 97.8 F (36.6 C)  Ht 5' 2.2" (1.58 m)  Wt 202 lb (91.627 kg)  BMI 36.70 kg/m2  SpO2 94%  Wt Readings from Last 3 Encounters:  05/27/15 202 lb (91.627 kg)  05/15/15 194 lb (87.998 kg)  05/06/15 194 lb (87.998 kg)    Physical Exam  Constitutional: She is oriented to person, place, and time. She appears well-developed and well-nourished. No distress.  HENT:  Head: Normocephalic and atraumatic.  Right Ear: Hearing normal.  Left Ear: Hearing normal.  Nose: Nose normal.  Eyes: Conjunctivae and lids  are normal. Right eye exhibits no discharge. Left eye exhibits no discharge. No scleral icterus.  Cardiovascular: Normal rate, regular rhythm and normal heart sounds.   Pulmonary/Chest: Effort normal and breath sounds normal. No respiratory distress.  Musculoskeletal: Normal range of motion.  Neurological: She is alert and oriented to person, place, and time.  Skin: Skin is intact. No rash noted.  Psychiatric: She has a normal mood and affect. Her speech is normal and behavior is normal. Judgment and thought content normal. Cognition and memory are normal.        Assessment & Plan:   Problem List Items Addressed This Visit      Cardiovascular and Mediastinum   Hypertension - Primary    The current medical regimen is effective;  continue present plan and medications.       Relevant Medications   benazepril (LOTENSIN) 40 MG tablet   metoprolol succinate (TOPROL-XL) 25 MG 24 hr tablet     Other   Failed back surgical syndrome    Discussed pain management with patient need for referral to Medical Center too further manage patient a safe fashion.  After established treatment , local pain clinic would be able to manage.  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  Discuss if we  would stop all narcotics from this office and be happy to make appropriate pain clinic referrals when patient is ready.  Discussed due to transportation issues patient doesn't want to go out of town but reviewed current medical legal climate and absolute need for pain clinic evaluation.        Relevant Medications   traMADol (ULTRAM) 50 MG tablet       Follow up plan: Return in about 3 months (around 08/25/2015) for Med check.

## 2015-05-27 NOTE — Assessment & Plan Note (Signed)
The current medical regimen is effective;  continue present plan and medications.  

## 2015-05-27 NOTE — Assessment & Plan Note (Signed)
Discussed pain management with patient need for referral to Medical Center too further manage patient a safe fashion.  After established treatment , local pain clinic would be able to manage.  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  Discuss if we would stop all narcotics from this office and be happy to make appropriate pain clinic referrals when patient is ready.  Discussed due to transportation issues patient doesn't want to go out of town but reviewed current medical legal climate and absolute need for pain clinic evaluation.

## 2015-05-28 ENCOUNTER — Ambulatory Visit: Payer: Medicare Other | Admitting: Family Medicine

## 2015-06-01 DIAGNOSIS — M1812 Unilateral primary osteoarthritis of first carpometacarpal joint, left hand: Secondary | ICD-10-CM | POA: Diagnosis not present

## 2015-06-01 DIAGNOSIS — M67432 Ganglion, left wrist: Secondary | ICD-10-CM | POA: Diagnosis not present

## 2015-06-17 ENCOUNTER — Ambulatory Visit (INDEPENDENT_AMBULATORY_CARE_PROVIDER_SITE_OTHER): Payer: Medicare Other | Admitting: Family Medicine

## 2015-06-17 ENCOUNTER — Other Ambulatory Visit: Payer: Self-pay | Admitting: Family Medicine

## 2015-06-17 ENCOUNTER — Encounter: Payer: Self-pay | Admitting: Family Medicine

## 2015-06-17 VITALS — BP 124/71 | HR 81 | Temp 98.0°F | Ht 62.2 in | Wt 194.0 lb

## 2015-06-17 DIAGNOSIS — M539 Dorsopathy, unspecified: Secondary | ICD-10-CM | POA: Diagnosis not present

## 2015-06-17 DIAGNOSIS — I10 Essential (primary) hypertension: Secondary | ICD-10-CM

## 2015-06-17 DIAGNOSIS — M961 Postlaminectomy syndrome, not elsewhere classified: Secondary | ICD-10-CM

## 2015-06-17 MED ORDER — HYDROCHLOROTHIAZIDE 12.5 MG PO CAPS: 12.5000 mg | ORAL_CAPSULE | Freq: Every day | ORAL | Status: DC

## 2015-06-17 MED ORDER — DIAZEPAM 5 MG PO TABS: 5.0000 mg | ORAL_TABLET | Freq: Three times a day (TID) | ORAL | Status: DC | PRN

## 2015-06-17 MED ORDER — TRAMADOL HCL 50 MG PO TABS: 50.0000 mg | ORAL_TABLET | Freq: Three times a day (TID) | ORAL | Status: DC

## 2015-06-17 NOTE — Assessment & Plan Note (Signed)
Well controlled but with side effects of from metoprolol will stop metoprolol add hydrochlorothiazide 12.5 mg Recheck blood pressure in a couple of months with BMP

## 2015-06-17 NOTE — Addendum Note (Signed)
Addended byVonita Moss: Himani Corona on: 06/17/2015 02:17 PM   Modules accepted: Orders, Medications

## 2015-06-17 NOTE — Progress Notes (Addendum)
BP 124/71 mmHg  Pulse 81  Temp(Src) 98 F (36.7 C)  Ht 5' 2.2" (1.58 m)  Wt 194 lb (87.998 kg)  BMI 35.25 kg/m2  SpO2 95%   Subjective:    Patient ID: Patricia Gomez, female    DOB: 03/14/1946, 70 y.o.   MRN: [id number]  HPI: Patricia Gomez is a 70 y.o. female  Chief Complaint  Patient presents with  . Foot Swelling  . memory issues   discussed patient's memory issues mostly drug-related patient will observe this or drug needs decline and get stopped. Wrist is in a cast and slowly improving. Patient concerned about bilateral ankle swelling and just in general problems with metoprolol started after starting this medication in April.  Relevant past medical, surgical, family and social history reviewed and updated as indicated. Interim medical history since our last visit reviewed. Allergies and medications reviewed and updated.  Review of Systems  Constitutional: Positive for fatigue. Negative for fever and chills.  Respiratory: Negative for cough, choking and shortness of breath.   Cardiovascular: Positive for leg swelling. Negative for chest pain and palpitations.    Per HPI unless specifically indicated above     Objective:    BP 124/71 mmHg  Pulse 81  Temp(Src) 98 F (36.7 C)  Ht 5' 2.2" (1.58 m)  Wt 194 lb (87.998 kg)  BMI 35.25 kg/m2  SpO2 95%  Wt Readings from Last 3 Encounters:  06/17/15 194 lb (87.998 kg)  05/27/15 202 lb (91.627 kg)  05/15/15 194 lb (87.998 kg)    Physical Exam  Constitutional: She is oriented to person, place, and time. She appears well-developed and well-nourished. No distress.  HENT:  Head: Normocephalic and atraumatic.  Right Ear: Hearing normal.  Left Ear: Hearing normal.  Nose: Nose normal.  Eyes: Conjunctivae and lids are normal. Right eye exhibits no discharge. Left eye exhibits no discharge. No scleral icterus.  Cardiovascular: Normal rate, regular rhythm and normal heart sounds.   Pulmonary/Chest: Effort normal and  breath sounds normal. No respiratory distress.  Musculoskeletal: Normal range of motion.  1 Plus bilateral ankle swelling  Neurological: She is alert and oriented to person, place, and time.  Skin: Skin is intact. No rash noted.  Psychiatric: She has a normal mood and affect. Her speech is normal and behavior is normal. Judgment and thought content normal. Cognition and memory are normal.    Results for orders placed or performed during the hospital encounter of 05/21/15  Surgical pathology  Result Value Ref Range   SURGICAL PATHOLOGY      Surgical Pathology CASE: [id number] PATIENT: Patricia Gomez Surgical Pathology Report     SPECIMEN SUBMITTED: A. Ganglion cyst, left  CLINICAL HISTORY: None provided  PRE-OPERATIVE DIAGNOSIS: Primary osteoarthritis of 1st carpometacarpal joint of left hand  POST-OPERATIVE DIAGNOSIS: Primary osteoarthritis of 1st carpometacarpal joint of left hand, ganglion cyst left wrist     DIAGNOSIS: A. GANGLION CYST, LEFT; EXCISION: - CONSISTENT WITH GANGLION CYST.  GROSS DESCRIPTION:  A. Labeled: left ganglion cyst wrist  Tissue fragment(s):2  Size:  aggregate, 1.2 x 0.3 x 0.1 cm  Description: fibrous gray fragments, inked blue  Entirely submitted in one cassette(s).    Final Diagnosis performed by Elijah Birkara Rubinas, MD.  Electronically signed 05/25/2015 2:05:59PM    The electronic signature indicates that the named Attending Pathologist has evaluated the specimen  Technical component performed at [facility], [address], [place], [place] [address] Lab:  [phone] Dir: Titus DubinWilliam F. Cato MulliganHancock, MD  Professional component  performed at [facility], [facility], [address] [place], [place], [place] [address] Lab: [phone] Dir: Georgiann Cocker. Rubinas, MD        Assessment & Plan:   Problem List Items Addressed This Visit      Cardiovascular and Mediastinum   Hypertension - Primary    Well controlled but with side  effects of from metoprolol will stop metoprolol add hydrochlorothiazide 12.5 mg Recheck blood pressure in a couple of months with BMP      Relevant Medications   hydrochlorothiazide (MICROZIDE) 12.5 MG capsule     Other   Failed back surgical syndrome   Relevant Medications   etodolac (LODINE) 500 MG tablet   diazepam (VALIUM) 5 MG tablet      Discuss memory issues and neuroactive medications tramadol, oxycodone, and Valium patient is stopping these medicines slowly and should improve memory will observe.  Follow up plan: Return if symptoms worsen or fail to improve, for As scheduled.

## 2015-06-29 ENCOUNTER — Telehealth: Payer: Self-pay | Admitting: Family Medicine

## 2015-06-29 NOTE — Telephone Encounter (Signed)
Called pharmacy and clarified directions on medication.

## 2015-06-29 NOTE — Telephone Encounter (Signed)
Pharmacy called to clarify rx for tramadol.

## 2015-07-03 ENCOUNTER — Telehealth: Payer: Self-pay | Admitting: Unknown Physician Specialty

## 2015-07-03 DIAGNOSIS — M1812 Unilateral primary osteoarthritis of first carpometacarpal joint, left hand: Secondary | ICD-10-CM | POA: Diagnosis not present

## 2015-07-03 MED ORDER — MELOXICAM 15 MG PO TABS: 15.0000 mg | ORAL_TABLET | Freq: Every day | ORAL | Status: DC

## 2015-07-03 NOTE — Telephone Encounter (Signed)
Forward to provider

## 2015-07-03 NOTE — Telephone Encounter (Signed)
Rx sent to her pharmacy 

## 2015-07-03 NOTE — Telephone Encounter (Signed)
Pt would like a refill on [facility]ic [facility]. She isn't sure if she is supposed to be still taking this medication or not but picked up last rx 02/2015

## 2015-07-15 DIAGNOSIS — H26492 Other secondary cataract, left eye: Secondary | ICD-10-CM | POA: Diagnosis not present

## 2015-07-20 ENCOUNTER — Telehealth: Payer: Self-pay | Admitting: Family Medicine

## 2015-07-20 ENCOUNTER — Other Ambulatory Visit: Payer: Self-pay | Admitting: Family Medicine

## 2015-07-20 DIAGNOSIS — M961 Postlaminectomy syndrome, not elsewhere classified: Secondary | ICD-10-CM

## 2015-07-20 MED ORDER — DIAZEPAM 5 MG PO TABS: 5.0000 mg | ORAL_TABLET | Freq: Three times a day (TID) | ORAL | Status: DC | PRN

## 2015-07-20 MED ORDER — TRAMADOL HCL 50 MG PO TABS: 50.0000 mg | ORAL_TABLET | Freq: Three times a day (TID) | ORAL | Status: DC

## 2015-07-20 NOTE — Telephone Encounter (Signed)
Pt called back stated that she misunderstood and that she will actually be seeing her doctor in 6 weeks not 6 months. Please disregard previous message. Thanks.

## 2015-07-20 NOTE — Telephone Encounter (Signed)
Pt would like to have oxycodone. She stated that she had surgery on her hand but they told her to come back in 6 months but they didn't give her any medications.

## 2015-07-22 DIAGNOSIS — H26491 Other secondary cataract, right eye: Secondary | ICD-10-CM | POA: Diagnosis not present

## 2015-07-24 DIAGNOSIS — M1812 Unilateral primary osteoarthritis of first carpometacarpal joint, left hand: Secondary | ICD-10-CM | POA: Diagnosis not present

## 2015-07-29 ENCOUNTER — Other Ambulatory Visit: Payer: Self-pay

## 2015-07-30 ENCOUNTER — Other Ambulatory Visit: Payer: Self-pay | Admitting: Family Medicine

## 2015-07-30 NOTE — Telephone Encounter (Signed)
Your patient 

## 2015-07-31 ENCOUNTER — Telehealth: Payer: Self-pay | Admitting: Family Medicine

## 2015-07-31 NOTE — Telephone Encounter (Signed)
Pharmacy called for clarification on tramadol.

## 2015-08-03 NOTE — Telephone Encounter (Signed)
Call pharm

## 2015-08-04 ENCOUNTER — Other Ambulatory Visit: Payer: Self-pay | Admitting: Family Medicine

## 2015-08-04 DIAGNOSIS — M961 Postlaminectomy syndrome, not elsewhere classified: Secondary | ICD-10-CM

## 2015-08-04 MED ORDER — DIAZEPAM 5 MG PO TABS: 5.0000 mg | ORAL_TABLET | Freq: Three times a day (TID) | ORAL | Status: DC | PRN

## 2015-08-04 MED ORDER — TRAMADOL HCL 50 MG PO TABS: 50.0000 mg | ORAL_TABLET | Freq: Three times a day (TID) | ORAL | Status: DC

## 2015-08-20 ENCOUNTER — Ambulatory Visit (INDEPENDENT_AMBULATORY_CARE_PROVIDER_SITE_OTHER): Payer: Medicare Other | Admitting: Family Medicine

## 2015-08-20 ENCOUNTER — Encounter: Payer: Self-pay | Admitting: Family Medicine

## 2015-08-20 VITALS — BP 128/69 | HR 75 | Temp 98.5°F | Ht 62.0 in | Wt 203.0 lb

## 2015-08-20 DIAGNOSIS — F419 Anxiety disorder, unspecified: Secondary | ICD-10-CM | POA: Insufficient documentation

## 2015-08-20 DIAGNOSIS — I10 Essential (primary) hypertension: Secondary | ICD-10-CM

## 2015-08-20 NOTE — Assessment & Plan Note (Signed)
The current medical regimen is effective;  continue present plan and medications.  

## 2015-08-20 NOTE — Assessment & Plan Note (Signed)
Stable on Valium 3 a day we will continue and recheck in 3 months.

## 2015-08-20 NOTE — Progress Notes (Signed)
   BP 128/69 mmHg  Pulse 75  Temp(Src) 98.5 F (36.9 C)  Ht 5\' 2"  (1.575 m)  Wt 203 lb (92.08 kg)  BMI 37.12 kg/m2  SpO2 95%   Subjective:    Patient ID: Patricia Gomez, female    DOB: 07/24/1945, 70 y.o.   MRN: [id number]  HPI: Patricia PaulaRebecca A Belen is a 70 y.o. female  Chief Complaint  Patient presents with  . medication check   patient follow-up hypertension blood pressures doing well with no complaints from medications taking faithfully Reflux stable with Prilosec gets occasionally choked but not bad enough to go see gastroenterology Patient had wrist surgery for a ganglion and is on Percocet from orthopedist who is giving patient pain medicine now patient otherwise is been on tramadol and meloxicam and stable Taking Valium for anxiety nerves III a day which is been stable Her anxiety is been long-term  Relevant past medical, surgical, family and social history reviewed and updated as indicated. Interim medical history since our last visit reviewed. Allergies and medications reviewed and updated.  Review of Systems  Constitutional: Negative.   Respiratory: Negative.   Cardiovascular: Negative.     Per HPI unless specifically indicated above     Objective:    BP 128/69 mmHg  Pulse 75  Temp(Src) 98.5 F (36.9 C)  Ht 5\' 2"  (1.575 m)  Wt 203 lb (92.08 kg)  BMI 37.12 kg/m2  SpO2 95%  Wt Readings from Last 3 Encounters:  08/20/15 203 lb (92.08 kg)  06/17/15 194 lb (87.998 kg)  05/27/15 202 lb (91.627 kg)    Physical Exam  Constitutional: She is oriented to person, place, and time. She appears well-developed and well-nourished. No distress.  HENT:  Head: Normocephalic and atraumatic.  Right Ear: Hearing normal.  Left Ear: Hearing normal.  Nose: Nose normal.  Eyes: Conjunctivae and lids are normal. Right eye exhibits no discharge. Left eye exhibits no discharge. No scleral icterus.  Cardiovascular: Normal rate, regular rhythm and normal heart sounds.    Pulmonary/Chest: Effort normal and breath sounds normal. No respiratory distress.  Musculoskeletal: Normal range of motion.  Neurological: She is alert and oriented to person, place, and time.  Skin: Skin is intact. No rash noted.  Psychiatric: She has a normal mood and affect. Her speech is normal and behavior is normal. Judgment and thought content normal. Cognition and memory are normal.        Assessment & Plan:   Problem List Items Addressed This Visit      Cardiovascular and Mediastinum   Essential hypertension - Primary    The current medical regimen is effective;  continue present plan and medications.         Other   Anxiety    Stable on Valium 3 a day we will continue and recheck in 3 months.          Follow up plan: Return in about 3 months (around 11/20/2015) for Nerve medicines check.

## 2015-08-27 ENCOUNTER — Other Ambulatory Visit: Payer: Self-pay

## 2015-08-28 DIAGNOSIS — M1812 Unilateral primary osteoarthritis of first carpometacarpal joint, left hand: Secondary | ICD-10-CM | POA: Diagnosis not present

## 2015-09-02 ENCOUNTER — Telehealth: Payer: Self-pay | Admitting: Family Medicine

## 2015-09-02 NOTE — Telephone Encounter (Signed)
Pt called and would like to have diflucan sent to [facility] [address]

## 2015-09-02 NOTE — Telephone Encounter (Signed)
Pt called asking about amitriptyline, I explained it was refused due to interaction with Tramadol.  She stated she'd rather have the amitriptyline and no tramadol because the amitriptyline helps her sleep.  I let her know Dr Dossie Arbourrissman was out of the office this week and no other provider would change medications for her, that we would talk again on Monday.  She also stated surgeon is no longer giving her Percocet and she will need to get it from us.  Please call pt to discuss.

## 2015-09-07 NOTE — Telephone Encounter (Signed)
Patient no longer needs to speak with Dr. She does not want the meds

## 2015-09-10 ENCOUNTER — Telehealth: Payer: Self-pay | Admitting: Family Medicine

## 2015-09-10 ENCOUNTER — Other Ambulatory Visit: Payer: Self-pay | Admitting: Family Medicine

## 2015-09-10 DIAGNOSIS — M961 Postlaminectomy syndrome, not elsewhere classified: Secondary | ICD-10-CM

## 2015-09-10 MED ORDER — DIAZEPAM 5 MG PO TABS: 5.0000 mg | ORAL_TABLET | Freq: Three times a day (TID) | ORAL | Status: DC | PRN

## 2015-09-10 MED ORDER — TRAMADOL HCL 50 MG PO TABS: 50.0000 mg | ORAL_TABLET | Freq: Three times a day (TID) | ORAL | Status: DC

## 2015-09-10 NOTE — Telephone Encounter (Signed)
Lurena JoinerRebecca left a vm on my phone complaining of muscle spasms in all extremities and rib cage, rib cage spasms are always on one side or the other.  She wants something to help with this.  She stated she has 15 Tramadol and 15 Percocet left and she wants to be off those soon.

## 2015-09-10 NOTE — Telephone Encounter (Signed)
Stretching and massage paying attention to healthy eating increasing fluids

## 2015-09-14 MED ORDER — HYDROXYZINE HCL 25 MG PO TABS: 25.0000 mg | ORAL_TABLET | Freq: Three times a day (TID) | ORAL | Status: DC | PRN

## 2015-09-14 NOTE — Telephone Encounter (Signed)
Pt called back to let [facility] know she is no longer taking amitripyline, tramadol, percocet, and valium. She said the last time she took it was about 2 to 3 weeks ago and she doesn't plan on going back on them. Pt stated she hid the pills so she could bring them in for next appt(almost a full bottle of valium and amitriptyline and 15 percocet.

## 2015-09-14 NOTE — Telephone Encounter (Signed)
Pt called and left VM on my phone stating she is worse and having more symptoms.  Would like for Harriett SineNancy to call her.

## 2015-09-15 ENCOUNTER — Emergency Department: Payer: Medicare Other

## 2015-09-15 ENCOUNTER — Inpatient Hospital Stay
Admission: EM | Admit: 2015-09-15 | Discharge: 2015-09-17 | DRG: 194 | Disposition: A | Discharge status: Home / Self Care | Payer: Medicare Other | Attending: Internal Medicine | Admitting: Internal Medicine

## 2015-09-15 ENCOUNTER — Telehealth: Payer: Self-pay | Admitting: Family Medicine

## 2015-09-15 ENCOUNTER — Encounter: Payer: Self-pay | Admitting: Emergency Medicine

## 2015-09-15 DIAGNOSIS — M62838 Other muscle spasm: Secondary | ICD-10-CM | POA: Diagnosis not present

## 2015-09-15 DIAGNOSIS — Z96653 Presence of artificial knee joint, bilateral: Secondary | ICD-10-CM | POA: Diagnosis present

## 2015-09-15 DIAGNOSIS — T502X5A Adverse effect of carbonic-anhydrase inhibitors, benzothiadiazides and other diuretics, initial encounter: Secondary | ICD-10-CM | POA: Diagnosis present

## 2015-09-15 DIAGNOSIS — F419 Anxiety disorder, unspecified: Secondary | ICD-10-CM | POA: Diagnosis present

## 2015-09-15 DIAGNOSIS — J181 Lobar pneumonia, unspecified organism: Secondary | ICD-10-CM

## 2015-09-15 DIAGNOSIS — F329 Major depressive disorder, single episode, unspecified: Secondary | ICD-10-CM | POA: Diagnosis present

## 2015-09-15 DIAGNOSIS — R42 Dizziness and giddiness: Secondary | ICD-10-CM | POA: Diagnosis present

## 2015-09-15 DIAGNOSIS — J45909 Unspecified asthma, uncomplicated: Secondary | ICD-10-CM | POA: Diagnosis present

## 2015-09-15 DIAGNOSIS — G629 Polyneuropathy, unspecified: Secondary | ICD-10-CM | POA: Diagnosis present

## 2015-09-15 DIAGNOSIS — Z87891 Personal history of nicotine dependence: Secondary | ICD-10-CM | POA: Diagnosis not present

## 2015-09-15 DIAGNOSIS — Z888 Allergy status to other drugs, medicaments and biological substances status: Secondary | ICD-10-CM | POA: Diagnosis not present

## 2015-09-15 DIAGNOSIS — Z7951 Long term (current) use of inhaled steroids: Secondary | ICD-10-CM

## 2015-09-15 DIAGNOSIS — J189 Pneumonia, unspecified organism: Principal | ICD-10-CM | POA: Diagnosis present

## 2015-09-15 DIAGNOSIS — R0602 Shortness of breath: Secondary | ICD-10-CM | POA: Diagnosis not present

## 2015-09-15 DIAGNOSIS — G43909 Migraine, unspecified, not intractable, without status migrainosus: Secondary | ICD-10-CM | POA: Diagnosis present

## 2015-09-15 DIAGNOSIS — M179 Osteoarthritis of knee, unspecified: Secondary | ICD-10-CM | POA: Diagnosis present

## 2015-09-15 DIAGNOSIS — Z79899 Other long term (current) drug therapy: Secondary | ICD-10-CM | POA: Diagnosis not present

## 2015-09-15 DIAGNOSIS — I129 Hypertensive chronic kidney disease with stage 1 through stage 4 chronic kidney disease, or unspecified chronic kidney disease: Secondary | ICD-10-CM | POA: Diagnosis present

## 2015-09-15 DIAGNOSIS — Z880 Allergy status to penicillin: Secondary | ICD-10-CM | POA: Diagnosis not present

## 2015-09-15 DIAGNOSIS — N189 Chronic kidney disease, unspecified: Secondary | ICD-10-CM | POA: Diagnosis present

## 2015-09-15 DIAGNOSIS — M8580 Other specified disorders of bone density and structure, unspecified site: Secondary | ICD-10-CM | POA: Diagnosis present

## 2015-09-15 DIAGNOSIS — E871 Hypo-osmolality and hyponatremia: Secondary | ICD-10-CM | POA: Diagnosis not present

## 2015-09-15 DIAGNOSIS — E785 Hyperlipidemia, unspecified: Secondary | ICD-10-CM | POA: Diagnosis present

## 2015-09-15 DIAGNOSIS — M5030 Other cervical disc degeneration, unspecified cervical region: Secondary | ICD-10-CM | POA: Diagnosis present

## 2015-09-15 DIAGNOSIS — E876 Hypokalemia: Secondary | ICD-10-CM

## 2015-09-15 DIAGNOSIS — K219 Gastro-esophageal reflux disease without esophagitis: Secondary | ICD-10-CM | POA: Diagnosis present

## 2015-09-15 DIAGNOSIS — Z7982 Long term (current) use of aspirin: Secondary | ICD-10-CM | POA: Diagnosis not present

## 2015-09-15 DIAGNOSIS — R05 Cough: Secondary | ICD-10-CM | POA: Diagnosis not present

## 2015-09-15 DIAGNOSIS — R079 Chest pain, unspecified: Secondary | ICD-10-CM | POA: Diagnosis not present

## 2015-09-15 DIAGNOSIS — D72829 Elevated white blood cell count, unspecified: Secondary | ICD-10-CM | POA: Diagnosis not present

## 2015-09-15 LAB — CBC
HEMATOCRIT: 34.5 % — AB (ref 35.0–47.0)
Hemoglobin: 11.2 g/dL — ABNORMAL LOW (ref 12.0–16.0)
MCH: 23.4 pg — AB (ref 26.0–34.0)
MCHC: 32.5 g/dL (ref 32.0–36.0)
MCV: 72.1 fL — ABNORMAL LOW (ref 80.0–100.0)
Platelets: 490 10*3/uL — ABNORMAL HIGH (ref 150–440)
RBC: 4.79 MIL/uL (ref 3.80–5.20)
RDW: 17.3 % — AB (ref 11.5–14.5)
WBC: 12.9 10*3/uL — AB (ref 3.6–11.0)

## 2015-09-15 LAB — URINALYSIS COMPLETE WITH MICROSCOPIC (ARMC ONLY)
BILIRUBIN URINE: NEGATIVE
Glucose, UA: NEGATIVE mg/dL
Ketones, ur: NEGATIVE mg/dL
Leukocytes, UA: NEGATIVE
NITRITE: NEGATIVE
PH: 6 (ref 5.0–8.0)
PROTEIN: 30 mg/dL — AB
Specific Gravity, Urine: 1.013 (ref 1.005–1.030)

## 2015-09-15 LAB — BASIC METABOLIC PANEL
ANION GAP: 12 (ref 5–15)
BUN: 9 mg/dL (ref 6–20)
CO2: 24 mmol/L (ref 22–32)
Calcium: 9.1 mg/dL (ref 8.9–10.3)
Chloride: 91 mmol/L — ABNORMAL LOW (ref 101–111)
Creatinine, Ser: 0.99 mg/dL (ref 0.44–1.00)
GFR, EST NON AFRICAN AMERICAN: 56 mL/min — AB (ref >60)
Glucose, Bld: 109 mg/dL — ABNORMAL HIGH (ref 65–99)
POTASSIUM: 3.3 mmol/L — AB (ref 3.5–5.1)
SODIUM: 127 mmol/L — AB (ref 135–145)

## 2015-09-15 MED ORDER — SODIUM CHLORIDE 0.9 % IV SOLN: 250.0000 mL | INTRAVENOUS | Status: DC | PRN

## 2015-09-15 MED ORDER — POTASSIUM CHLORIDE CRYS ER 20 MEQ PO TBCR
40.0000 meq | EXTENDED_RELEASE_TABLET | Freq: Once | ORAL | Status: AC
  Administered 2015-09-15: 40 meq via ORAL
  Filled 2015-09-15: qty 2

## 2015-09-15 MED ORDER — ASPIRIN EC 81 MG PO TBEC
81.0000 mg | DELAYED_RELEASE_TABLET | Freq: Every day | ORAL | Status: DC
  Administered 2015-09-16 – 2015-09-17 (×2): 81 mg via ORAL
  Filled 2015-09-15 (×2): qty 1

## 2015-09-15 MED ORDER — SODIUM CHLORIDE 0.9 % IV BOLUS (SEPSIS)
1000.0000 mL | Freq: Once | INTRAVENOUS | Status: AC
  Administered 2015-09-15: 1000 mL via INTRAVENOUS

## 2015-09-15 MED ORDER — IPRATROPIUM-ALBUTEROL 0.5-2.5 (3) MG/3ML IN SOLN: 3.0000 mL | Freq: Once | RESPIRATORY_TRACT | Status: DC

## 2015-09-15 MED ORDER — LEVALBUTEROL HCL 1.25 MG/0.5ML IN NEBU: 1.2500 mg | INHALATION_SOLUTION | Freq: Four times a day (QID) | RESPIRATORY_TRACT | Status: DC | PRN

## 2015-09-15 MED ORDER — SODIUM CHLORIDE 0.9% FLUSH: 3.0000 mL | INTRAVENOUS | Status: DC | PRN

## 2015-09-15 MED ORDER — SODIUM CHLORIDE 0.9% FLUSH
3.0000 mL | Freq: Two times a day (BID) | INTRAVENOUS | Status: DC
  Administered 2015-09-16: 3 mL via INTRAVENOUS

## 2015-09-15 MED ORDER — ENOXAPARIN SODIUM 40 MG/0.4ML ~~LOC~~ SOLN
40.0000 mg | SUBCUTANEOUS | Status: DC
  Administered 2015-09-16: 40 mg via SUBCUTANEOUS
  Filled 2015-09-15: qty 0.4

## 2015-09-15 MED ORDER — BENAZEPRIL HCL 20 MG PO TABS
40.0000 mg | ORAL_TABLET | Freq: Every day | ORAL | Status: DC
  Administered 2015-09-16 – 2015-09-17 (×2): 40 mg via ORAL
  Filled 2015-09-15 (×2): qty 2

## 2015-09-15 MED ORDER — LEVOFLOXACIN IN D5W 750 MG/150ML IV SOLN
750.0000 mg | Freq: Once | INTRAVENOUS | Status: AC
  Administered 2015-09-16: 750 mg via INTRAVENOUS
  Filled 2015-09-15: qty 150

## 2015-09-15 MED ORDER — LEVOFLOXACIN IN D5W 750 MG/150ML IV SOLN
750.0000 mg | INTRAVENOUS | Status: DC
  Filled 2015-09-15: qty 150

## 2015-09-15 MED ORDER — PANTOPRAZOLE SODIUM 40 MG PO TBEC
40.0000 mg | DELAYED_RELEASE_TABLET | Freq: Every day | ORAL | Status: DC
  Administered 2015-09-16 – 2015-09-17 (×2): 40 mg via ORAL
  Filled 2015-09-15 (×2): qty 1

## 2015-09-15 MED ORDER — HYDROXYZINE HCL 25 MG PO TABS
25.0000 mg | ORAL_TABLET | Freq: Three times a day (TID) | ORAL | Status: DC | PRN
  Administered 2015-09-16: 25 mg via ORAL
  Filled 2015-09-15 (×2): qty 1

## 2015-09-15 MED ORDER — ALBUTEROL SULFATE (2.5 MG/3ML) 0.083% IN NEBU
3.0000 mL | INHALATION_SOLUTION | Freq: Four times a day (QID) | RESPIRATORY_TRACT | Status: DC | PRN
  Administered 2015-09-16: 3 mL via RESPIRATORY_TRACT
  Filled 2015-09-15: qty 3

## 2015-09-15 NOTE — H&P (Signed)
[facility] -  at [facility]   PATIENT NAME: Patricia KungRebecca Buffa    MR#:  [id number]  DATE OF BIRTH:  04/27/1946  DATE OF ADMISSION:  09/15/2015  PRIMARY CARE PHYSICIAN: Vonita MossMark Crissman, MD   REQUESTING/REFERRING PHYSICIAN: Governor Rooksebecca Lord, MD  CHIEF COMPLAINT:   Chief Complaint  Patient presents with  . Dizziness  Cough, shortness of breath and weakness for 2 weeks of worsening for 3 days.  HISTORY OF PRESENT ILLNESS:  Patricia Gomez  is a 70 y.o. female with a known history of Hypertension, CKD and hyperlipidemia. The patient has been feeling sick, cough and shortness of breath for 2 weeks, but worsening for the past the 3 days. Patient complains of fever, chills, poor oral intake but denies any chest pain, palpitation, orthopnea or nocturnal dyspnea or leg edema. Chest x-ray in ED show left base pneumonia. The patient was treated with Levaquin in the ED.  PAST MEDICAL HISTORY:   Past Medical History  Diagnosis Date  . Hx of migraines   . Anxiety   . Osteopenia   . Neuropathy (HCC)   . GERD (gastroesophageal reflux disease)   . Hyperlipidemia   . Depression   . Hypertension   . Chronic kidney disease   . Anemia   . DJD (degenerative joint disease) of knee   . Asthma   . Shortness of breath dyspnea   . Wheezing   . Incontinence   . Panic attack     PAST SURGICAL HISTORY:   Past Surgical History  Procedure Laterality Date  . Replacement total knee bilateral Bilateral 2011  . Abdominal hysterectomy    . Eye surgery Bilateral 2013  . Oophorectomy      with tumor removed before hysterectomy  . Tubal ligation    . Joint replacement      bilateral knees  . Breast enhancement surgery    . Intraocular lens insertion Bilateral   . Carpometacarpal (cmc) fusion of thumb Left 05/21/2015    Procedure: CARPOMETACARPAL (CMC) FUSION OF THUMB;  Surgeon: Christena FlakeJohn J Poggi, MD;  Location: [facility] ORS;  Service: Orthopedics;  Laterality: Left;  . Ganglion cyst  excision Left 05/21/2015    Procedure: REMOVAL GANGLION OF WRIST;  Surgeon: Christena FlakeJohn J Poggi, MD;  Location: [facility] ORS;  Service: Orthopedics;  Laterality: Left;    SOCIAL HISTORY:   Social History  Substance Use Topics  . Smoking status: Former Smoker    Quit date: 10/20/2008  . Smokeless tobacco: Never Used  . Alcohol Use: No    FAMILY HISTORY:   Family History  Problem Relation Age of Onset  . Muscular dystrophy Sister   . Heart disease Sister   . Mental illness Brother   . Heart disease Mother   . Mental illness Mother   . Heart attack Father     DRUG ALLERGIES:   Allergies  Allergen Reactions  . Penicillins Anaphylaxis, Rash and Other (See Comments)    Has patient had a PCN reaction causing immediate rash, facial/tongue/throat swelling, SOB or lightheadedness with hypotension: Yes Has patient had a PCN reaction causing severe rash involving mucus membranes or skin necrosis: No Has patient had a PCN reaction that required hospitalization No Has patient had a PCN reaction occurring within the last 10 years: No If all of the above answers are "NO", then may proceed with Cephalosporin use.  . Flagyl [Metronidazole] Nausea And Vomiting  . Prednisone Diarrhea and Nausea And Vomiting    REVIEW OF SYSTEMS:  CONSTITUTIONAL:  Has fever, chills, poor oral intake and generalized weakness.  EYES: No blurred or double vision.  EARS, NOSE, AND THROAT: No tinnitus or ear pain.  RESPIRATORY: Has cough, shortness of breath, wheezing but no hemoptysis.  CARDIOVASCULAR: No chest pain, orthopnea, edema.  GASTROINTESTINAL: No nausea, vomiting, diarrhea or abdominal pain.  GENITOURINARY: No dysuria, hematuria.  ENDOCRINE: No polyuria, nocturia,  HEMATOLOGY: No anemia, easy bruising or bleeding SKIN: No rash or lesion. MUSCULOSKELETAL: No joint pain or arthritis.   NEUROLOGIC: No tingling, numbness, weakness.  PSYCHIATRY: No anxiety or depression.   MEDICATIONS AT HOME:   Prior to  Admission medications   Medication Sig Start Date End Date Taking? Authorizing Provider  albuterol (PROVENTIL HFA;VENTOLIN HFA) 108 (90 BASE) MCG/ACT inhaler Inhale 2 puffs into the lungs every 6 (six) hours as needed for wheezing or shortness of breath. 01/19/15  Yes Steele Sizer, MD  aspirin EC 81 MG tablet Take 81 mg by mouth daily.   Yes Historical Provider, MD  benazepril (LOTENSIN) 40 MG tablet Take 1 tablet (40 mg total) by mouth daily. 05/27/15  Yes Steele Sizer, MD  hydrochlorothiazide (MICROZIDE) 12.5 MG capsule Take 1 capsule (12.5 mg total) by mouth daily. 06/17/15  Yes Steele Sizer, MD  hydrOXYzine (ATARAX/VISTARIL) 25 MG tablet Take 25 mg by mouth 3 (three) times daily as needed (for muscle cramps).   Yes Historical Provider, MD  meloxicam (MOBIC) 15 MG tablet Take 15 mg by mouth daily.   Yes Historical Provider, MD  omeprazole (PRILOSEC) 20 MG capsule Take 20 mg by mouth daily.   Yes Historical Provider, MD  diazepam (VALIUM) 5 MG tablet Take 1 tablet (5 mg total) by mouth every 8 (eight) hours as needed for anxiety. Patient not taking: Reported on 09/15/2015 09/10/15   Steele Sizer, MD  traMADol (ULTRAM) 50 MG tablet Take 1 tablet (50 mg total) by mouth 3 (three) times daily. 1-2 tablets Patient not taking: Reported on 09/15/2015 09/10/15   Steele Sizer, MD      VITAL SIGNS:  Blood pressure 153/56, pulse 96, temperature 97.8 F (36.6 C), temperature source Oral, resp. rate 18, height  (1.549 m), weight 86.637 kg (191 lb), SpO2 99 %.  PHYSICAL EXAMINATION:  GENERAL:  70 y.o.-year-old patient lying in the bed with no acute distress.  EYES: Pupils equal, round, reactive to light and accommodation. No scleral icterus. Extraocular muscles intact.  HEENT: Head atraumatic, normocephalic. Oropharynx and nasopharynx clear. Dry oral mucosa. NECK:  Supple, no jugular venous distention. No thyroid enlargement, no tenderness.  LUNGS: Normal breath sounds bilaterally, no  wheezing, rales,rhonchi or crepitation. No use of accessory muscles of respiration.  CARDIOVASCULAR: S1, S2 normal. No murmurs, rubs, or gallops.  ABDOMEN: Soft, nontender, nondistended. Bowel sounds present. No organomegaly or mass.  EXTREMITIES: No pedal edema, cyanosis, or clubbing.  NEUROLOGIC: Cranial nerves II through XII are intact. Muscle strength 5/5 in all extremities. Sensation intact. Gait not checked.  PSYCHIATRIC: The patient is alert and oriented x 3.  SKIN: No obvious rash, lesion, or ulcer.   LABORATORY PANEL:   CBC  Recent Labs Lab 09/15/15 1612  WBC 12.9*  HGB 11.2*  HCT 34.5*  PLT 490*   ------------------------------------------------------------------------------------------------------------------  Chemistries   Recent Labs Lab 09/15/15 1612  NA 127*  K 3.3*  CL 91*  CO2 24  GLUCOSE 109*  BUN 9  CREATININE 0.99  CALCIUM 9.1   ------------------------------------------------------------------------------------------------------------------  Cardiac Enzymes No results for input(s): TROPONINI in the  last 168 hours. ------------------------------------------------------------------------------------------------------------------  RADIOLOGY:  Dg Chest 2 View  09/15/2015  CLINICAL DATA:  Shortness of breath and cough EXAM: CHEST  2 VIEW COMPARISON:  May 21, 2015 FINDINGS: There is ill-defined opacity in the lateral left base. Lungs elsewhere clear. Heart size and pulmonary vascularity are normal. No adenopathy. No bone lesions. IMPRESSION: Ill-defined opacity lateral left base, not present on prior study. This area is felt to represent a focal area of pneumonia. Lungs elsewhere are clear. No change in cardiac silhouette. Followup PA and lateral chest radiographs recommended in 3-4 weeks following trial of antibiotic therapy to ensure resolution and exclude underlying malignancy. Electronically Signed   By: Bretta Bang III M.D.   On: 09/15/2015  17:18    EKG:   Orders placed or performed during the hospital encounter of 09/15/15  . ED EKG  . ED EKG  . EKG 12-Lead  . EKG 12-Lead    IMPRESSION AND PLAN:  Pneumonia, CAP The patient will be admitted to medical floor. I will continue Levaquin, follow-up CBC and blood culture and sputum culture. Continue nebulizer treatment.  Leukocytosis, due to pneumonia, follow-up CBC.   Hyponatremia. Start normal saline and follow-up BMP. Hold HCTZ. Hypokalemia, give potassium supplement and follow-up magnesium level and potassium level. Hypertension, continue Lotensin but hold HCTZ.  All the records are reviewed and case discussed with ED provider. Management plans discussed with the patient, family and they are in agreement.  CODE STATUS: Full code  TOTAL TIME TAKING CARE OF THIS PATIENT: 56 minutes.    Shaune Pollack M.D on 09/15/2015 at 9:04 PM  Between 7am to 6pm - Pager - [phone]  After 6pm go to [email or web address] - password [email or web address] [facility]  [facility]  Office  [phone]  CC: Primary care physician; Vonita Moss, MD

## 2015-09-15 NOTE — Telephone Encounter (Signed)
Spoke with pt and advised her that MAC said symptoms had went on too long for tamiflu and advised her to take mucinex DM. She then told me that [facility]

## 2015-09-15 NOTE — ED Notes (Signed)
Ordered medications not available in ED pyxis, called pharmacy. Medications being sent to new floor.

## 2015-09-15 NOTE — ED Notes (Signed)
Collie SiadDonna, neighbor, [phone]

## 2015-09-15 NOTE — ED Provider Notes (Signed)
[facility] Emergency Department Provider Note   ____________________________________________  Time seen: Approximately 7:45 PM I have reviewed the triage vital signs and the triage nursing note.  HISTORY  Chief Complaint Dizziness   Historian Patient  HPI Patricia Gomez is a 70 y.o. female who reports shortness of breath and cough for about 2 weeks, but much worse over the past 3 or 4 days. She went to [facility] and was found to be tachycardic and having trouble breathing and she was sent to the emergency department for further evaluation. X-ray and patient reports some central chest pressure especially with coughing episodes. She does use an inhaler at times, albuterol. Next the next line no fever or chills. No known sick contacts.  She has had productive cough of yellow sputum.Symptoms are moderate. Nothing makes it worse or better.   She's had generalized weakness, and dizziness, without focal weakness or confusion altered mental status. Past Medical History  Diagnosis Date  . Hx of migraines   . Anxiety   . Osteopenia   . Neuropathy (HCC)   . GERD (gastroesophageal reflux disease)   . Hyperlipidemia   . Depression   . Hypertension   . Chronic kidney disease   . Anemia   . DJD (degenerative joint disease) of knee   . Asthma   . Shortness of breath dyspnea   . Wheezing   . Incontinence   . Panic attack     Patient Active Problem List   Diagnosis Date Noted  . Anxiety 08/20/2015  . Asthma 05/06/2015  . Anemia   . Hyperlipidemia   . Depression   . Osteopenia   . Essential hypertension   . Chronic kidney disease   . Neuropathy (HCC)   . DJD (degenerative joint disease) of knee   . Failed back surgical syndrome   . Vaginal atrophy 12/16/2014  . DDD (degenerative disc disease), cervical 10/21/2014  . DDD (degenerative disc disease), lumbar 10/21/2014  . Facet syndrome 10/21/2014  . Degenerative joint disease of sacroiliac joint  10/21/2014  . Bilateral occipital neuralgia 10/21/2014    Past Surgical History  Procedure Laterality Date  . Replacement total knee bilateral Bilateral 2011  . Abdominal hysterectomy    . Eye surgery Bilateral 2013  . Oophorectomy      with tumor removed before hysterectomy  . Tubal ligation    . Joint replacement      bilateral knees  . Breast enhancement surgery    . Intraocular lens insertion Bilateral   . Carpometacarpal (cmc) fusion of thumb Left 05/21/2015    Procedure: CARPOMETACARPAL (CMC) FUSION OF THUMB;  Surgeon: Christena Flake, MD;  Location: [facility] ORS;  Service: Orthopedics;  Laterality: Left;  . Ganglion cyst excision Left 05/21/2015    Procedure: REMOVAL GANGLION OF WRIST;  Surgeon: Christena Flake, MD;  Location: [facility] ORS;  Service: Orthopedics;  Laterality: Left;    Current Outpatient Rx  Name  Route  Sig  Dispense  Refill  . albuterol (PROVENTIL HFA;VENTOLIN HFA) 108 (90 BASE) MCG/ACT inhaler   Inhalation   Inhale 2 puffs into the lungs every 6 (six) hours as needed for wheezing or shortness of breath.   2 Inhaler   6   . amitriptyline (ELAVIL) 100 MG tablet   Oral   Take 100 mg by mouth at bedtime.      0   . aspirin EC 81 MG tablet   Oral   Take 81 mg by mouth daily.         Marland Kitchen  benazepril (LOTENSIN) 40 MG tablet   Oral   Take 1 tablet (40 mg total) by mouth daily.   30 tablet   6   . diazepam (VALIUM) 5 MG tablet   Oral   Take 1 tablet (5 mg total) by mouth every 8 (eight) hours as needed for anxiety.   84 tablet   0   . hydrochlorothiazide (MICROZIDE) 12.5 MG capsule   Oral   Take 1 capsule (12.5 mg total) by mouth daily.   30 capsule   3   . hydrOXYzine (ATARAX/VISTARIL) 25 MG tablet   Oral   Take 1 tablet (25 mg total) by mouth 3 (three) times daily as needed (Use for muscle cramps).   30 tablet   0   . meloxicam (MOBIC) 15 MG tablet      take 1 tablet by mouth once daily   30 tablet   3   . omeprazole (PRILOSEC) 20 MG capsule       take 1 capsule by mouth once daily   30 capsule   12   . traMADol (ULTRAM) 50 MG tablet   Oral   Take 1 tablet (50 mg total) by mouth 3 (three) times daily. 1-2 tablets   84 tablet   0     Allergies Flagyl; Prednisone; and Penicillins  Family History  Problem Relation Age of Onset  . Muscular dystrophy Sister   . Heart disease Sister   . Mental illness Brother   . Heart disease Mother   . Mental illness Mother   . Heart attack Father     Social History Social History  Substance Use Topics  . Smoking status: Former Smoker    Quit date: 10/20/2008  . Smokeless tobacco: Never Used  . Alcohol Use: No    Review of Systems  Constitutional: Negative for fever. Eyes: Negative for visual changes. ENT: Negative for sore throat. Cardiovascular: Positive for chest pain. Respiratory: Positive for shortness of breath. Gastrointestinal: Negative for abdominal pain, vomiting and diarrhea. Genitourinary: Negative for dysuria. Musculoskeletal: Negative for back pain. Skin: Negative for rash. Neurological: Negative for headache. 10 point Review of Systems otherwise negative ____________________________________________   PHYSICAL EXAM:  VITAL SIGNS: ED Triage Vitals  Enc Vitals Group     BP 09/15/15 1558 133/79 mmHg     Pulse Rate 09/15/15 1558 110     Resp 09/15/15 1558 22     Temp 09/15/15 1558 98.1 F (36.7 C)     Temp Source 09/15/15 1558 Oral     SpO2 09/15/15 1558 95 %     Weight 09/15/15 1558 191 lb (86.637 kg)     Height 09/15/15 1558 5\' 1"  (1.549 m)     Head Cir --      Peak Flow --      Pain Score 09/15/15 1556 4     Pain Loc --      Pain Edu? --      Excl. in GC? --      Constitutional: Alert and oriented. Looks like she doesn't feel well and having some audible wheezing. HEENT   Head: Normocephalic and atraumatic.      Eyes: Conjunctivae are normal. PERRL. Normal extraocular movements.      Ears:         Nose: No congestion/rhinnorhea.    Mouth/Throat: Mucous membranes are mildly dry.   Neck: No stridor. Cardiovascular/Chest: Tachycardic and regular..  No murmurs, rubs, or gallops. Respiratory: No tachypnea, nor retractions, but audible wheezing  without stethoscope, with stethoscope all fields. Gastrointestinal: Soft. No distention, no guarding, no rebound. Nontender.   Genitourinary/rectal:Deferred Musculoskeletal: Nontender with normal range of motion in all extremities. No joint effusions.  No lower extremity tenderness.  No edema. Neurologic:  Normal speech and language. No gross or focal neurologic deficits are appreciated. Skin:  Skin is warm, dry and intact. No rash noted. Psychiatric: Mood and affect are normal. Speech and behavior are normal. Patient exhibits appropriate insight and judgment.  ____________________________________________   EKG I, Governor Rooks, MD, the attending physician have personally viewed and interpreted all ECGs.  109 bpm. Sinus tachycardia. Narrow QRS. Normal axis. Normal T-wave ____________________________________________  LABS (pertinent positives/negatives)  Basic metabolic panel significant for sodium 127, potassium 3.3, chloride 91 White blood count 4.9, hemoglobin 11.2 and platelet count 490 Urinalysis few bacteria and otherwise without significant abnormality  ____________________________________________  RADIOLOGY All Xrays were viewed by me. Imaging interpreted by Radiologist.  Chest two-view:   IMPRESSION: Ill-defined opacity lateral left base, not present on prior study. This area is felt to represent a focal area of pneumonia. Lungs elsewhere are clear. No change in cardiac silhouette.  Followup PA and lateral chest radiographs recommended in 3-4 weeks following trial of antibiotic therapy to ensure resolution and exclude underlying malignancy. __________________________________________  PROCEDURES  Procedure(s) performed: None  Critical Care performed:  None  ____________________________________________   ED COURSE / ASSESSMENT AND PLAN  Pertinent labs & imaging results that were available during my care of the patient were reviewed by me and considered in my medical decision making (see chart for details).  This patient is obviously having bronchospasm, and although no fever or hypoxia, she has had a cough productive of sputum reportedly, and infiltrate on chest x-ray concerning for pneumonia.  In addition to her shortness of breath, pneumonia, tachycardia, and electrolyte abnormalities consisting of hyponatremia and hypokalemia, I am to treat her with antibiotic for pneumonia and have her admitted for observation.  She was also given breathing treatment for COPD exacerbation/bronchospasm.  We'll hold off on Solu-Medrol, as I am treating her pneumonia with Levaquin due to penicillin allergy.    CONSULTATIONS:   Hospitalist for admission   Patient / Family / Caregiver informed of clinical course, medical decision-making process, and agree with plan.     ___________________________________________   FINAL CLINICAL IMPRESSION(S) / ED DIAGNOSES   Final diagnoses:  Hyponatremia  Hypokalemia  Left lower lobe pneumonia              Note: This dictation was prepared with Dragon dictation. Any transcriptional errors that result from this process are unintentional   Governor Rooks, MD 09/15/15 2018

## 2015-09-15 NOTE — ED Notes (Signed)
[facility]   With some SOB    Light headed cough and discomfort in rib

## 2015-09-15 NOTE — Telephone Encounter (Signed)
Pt called stated that she wants Dr. Dossie Arbourrissman to call in something in for her for the flu. Please advise. Thanks.

## 2015-09-16 LAB — CBC
HCT: 31.2 % — ABNORMAL LOW (ref 35.0–47.0)
HEMOGLOBIN: 10.3 g/dL — AB (ref 12.0–16.0)
MCH: 24.2 pg — AB (ref 26.0–34.0)
MCHC: 33.1 g/dL (ref 32.0–36.0)
MCV: 73.3 fL — AB (ref 80.0–100.0)
Platelets: 385 10*3/uL (ref 150–440)
RBC: 4.25 MIL/uL (ref 3.80–5.20)
RDW: 17.4 % — ABNORMAL HIGH (ref 11.5–14.5)
WBC: 10.4 10*3/uL (ref 3.6–11.0)

## 2015-09-16 LAB — BASIC METABOLIC PANEL
Anion gap: 8 (ref 5–15)
BUN: 11 mg/dL (ref 6–20)
CO2: 24 mmol/L (ref 22–32)
Calcium: 8.3 mg/dL — ABNORMAL LOW (ref 8.9–10.3)
Chloride: 96 mmol/L — ABNORMAL LOW (ref 101–111)
Creatinine, Ser: 0.95 mg/dL (ref 0.44–1.00)
GFR calc Af Amer: >60 mL/min (ref >60)
GFR calc non Af Amer: 59 mL/min — ABNORMAL LOW (ref >60)
GLUCOSE: 122 mg/dL — AB (ref 65–99)
POTASSIUM: 3.9 mmol/L (ref 3.5–5.1)
Sodium: 128 mmol/L — ABNORMAL LOW (ref 135–145)

## 2015-09-16 LAB — INFLUENZA PANEL BY PCR (TYPE A & B)
H1N1FLUPCR: NOT DETECTED
Influenza A By PCR: NEGATIVE
Influenza B By PCR: NEGATIVE

## 2015-09-16 LAB — TSH: TSH: 2.589 u[IU]/mL (ref 0.350–4.500)

## 2015-09-16 LAB — STREP PNEUMONIAE URINARY ANTIGEN: Strep Pneumo Urinary Antigen: NEGATIVE

## 2015-09-16 MED ORDER — HYDROXYZINE HCL 25 MG PO TABS
25.0000 mg | ORAL_TABLET | Freq: Four times a day (QID) | ORAL | Status: DC | PRN
  Administered 2015-09-16 – 2015-09-17 (×4): 25 mg via ORAL
  Filled 2015-09-16 (×3): qty 1

## 2015-09-16 MED ORDER — SODIUM CHLORIDE 0.9 % IV SOLN
INTRAVENOUS | Status: DC
Start: 2015-09-16 — End: 2015-09-17
  Administered 2015-09-16 – 2015-09-17 (×2): via INTRAVENOUS

## 2015-09-16 NOTE — Progress Notes (Signed)
Pharmacy progress note:  Antibiotic renal adjustment. Currently on Levaquin 750 mg IV q 24 hours. OK for current renal function.  Pharmacy will follow and adjust as needed.

## 2015-09-16 NOTE — Progress Notes (Signed)
Initial Nutrition Assessment   INTERVENTION:   -Cater to pt preferences on current diet order; will specify no Malawiturkey no trays with dining services.   NUTRITION DIAGNOSIS:   No nutrition diagnosis at this time  GOAL:   Patient will meet greater than or equal to 90% of their needs  MONITOR:   PO intake, Labs, Weight trends, I & O's  REASON FOR ASSESSMENT:   Malnutrition Screening Tool    ASSESSMENT:   Pt admitted with community acquired pna.  Past Medical History  Diagnosis Date  . Hx of migraines   . Anxiety   . Osteopenia   . Neuropathy (HCC)   . GERD (gastroesophageal reflux disease)   . Hyperlipidemia   . Depression   . Hypertension   . Chronic kidney disease   . Anemia   . DJD (degenerative joint disease) of knee   . Asthma   . Shortness of breath dyspnea   . Wheezing   . Incontinence   . Panic attack      Diet Order:  Diet Heart Room service appropriate?: Yes; Fluid consistency:: Thin    Current Nutrition: Pt reports appetite is very good. Pt reports eating 100% of breakfast and a little bit of lunch as she was sent Malawiturkey on chef salad (as she does not like). Pt reports good appetite 3 meals PTA. Pt reports MD outpatient wanted pt to drink 9 16ounces per day. Pt reports not drinking that much now.   Medications: reviewed NS at 7975mL/hr Labs: reviewed, Na 128 this am  Gastrointestinal Profile: Last BM:  09/16/2015   Nutrition-Focused Physical Exam Findings: Nutrition-Focused physical exam completed. Findings are WDL for fat depletion, muscle depletion, and edema.    Weight Change: Pt reports weight loss of 13lbs in one and half week but reports being given a fluid pill outpatient to pull fluid off   Skin:  Reviewed, no issues   Height:   Ht Readings from Last 1 Encounters:  09/15/15 5\' 1"  (1.549 m)    Weight:   Wt Readings from Last 1 Encounters:  09/15/15 191 lb (86.637 kg)   Wt Readings from Last 10 Encounters:  09/15/15 191 lb  (86.637 kg)  08/20/15 203 lb (92.08 kg)  06/17/15 194 lb (87.998 kg)  05/27/15 202 lb (91.627 kg)  05/15/15 194 lb (87.998 kg)  05/06/15 194 lb (87.998 kg)  04/07/15 197 lb (89.359 kg)  03/05/15 193 lb (87.544 kg)  02/02/15 198 lb (89.812 kg)  12/31/14 198 lb (89.812 kg)    Ideal Body Weight:   47.8kg  BMI:  Body mass index is 36.11 kg/(m^2).  Estimated Nutritional Needs:   Kcal:  1200-1500kcals (using IBW of 47.8kg)  Protein:  38-48g protein (0.8-1.0g/kg)  Fluid:  1.4-1.6L  EDUCATION NEEDS:   No education needs identified at this time  Leda QuailAllyson Dashia Caldeira, RD, LDN Pager [phone] Weekend/On-Call Pager [phone]

## 2015-09-16 NOTE — Plan of Care (Signed)
Problem: Education: Goal: Knowledge of [facility] Education information/materials will improve Patient was educated about her disease.

## 2015-09-16 NOTE — Progress Notes (Signed)
[facility] - [place] at [facility]   PATIENT NAME: Patricia Gomez    MR#:  [id number]  DATE OF BIRTH:  1945/12/06  SUBJECTIVE:   Having some muscle spasms. Denies shortness of breath or chest pain States she is not depressed.  REVIEW OF SYSTEMS:    Review of Systems  Constitutional: Negative for fever, chills and malaise/fatigue.  HENT: Negative for ear discharge, ear pain, hearing loss, nosebleeds and sore throat.   Eyes: Negative for blurred vision and pain.  Respiratory: Negative for cough, hemoptysis, shortness of breath and wheezing.   Cardiovascular: Negative for chest pain, palpitations and leg swelling.  Gastrointestinal: Negative for nausea, vomiting, abdominal pain, diarrhea and blood in stool.  Genitourinary: Negative for dysuria.  Musculoskeletal: Negative for back pain.       Muscle spasms  Neurological: Negative for dizziness, tremors, speech change, focal weakness, seizures and headaches.  Endo/Heme/Allergies: Does not bruise/bleed easily.  Psychiatric/Behavioral: Negative for depression, suicidal ideas and hallucinations.    Tolerating Diet:yes      DRUG ALLERGIES:   Allergies  Allergen Reactions  . Penicillins Anaphylaxis, Rash and Other (See Comments)    Has patient had a PCN reaction causing immediate rash, facial/tongue/throat swelling, SOB or lightheadedness with hypotension: Yes Has patient had a PCN reaction causing severe rash involving mucus membranes or skin necrosis: No Has patient had a PCN reaction that required hospitalization No Has patient had a PCN reaction occurring within the last 10 years: No If all of the above answers are "NO", then may proceed with Cephalosporin use.  . Flagyl [Metronidazole] Nausea And Vomiting  . Prednisone Diarrhea and Nausea And Vomiting    VITALS:  Blood pressure 143/62, pulse 99, temperature 98.1 F (36.7 C), temperature source Oral, resp. rate 18, height  (1.549 m), weight  86.637 kg (191 lb), SpO2 96 %.  PHYSICAL EXAMINATION:   Physical Exam  Constitutional: She is oriented to person, place, and time and well-developed, well-nourished, and in no distress. No distress.  HENT:  Head: Normocephalic.  Eyes: No scleral icterus.  Neck: Normal range of motion. Neck supple. No JVD present. No tracheal deviation present.  Cardiovascular: Normal rate, regular rhythm and normal heart sounds.  Exam reveals no gallop and no friction rub.   No murmur heard. Pulmonary/Chest: Effort normal and breath sounds normal. No respiratory distress. She has no wheezes. She has no rales. She exhibits no tenderness.  Abdominal: Soft. Bowel sounds are normal. She exhibits no distension and no mass. There is no tenderness. There is no rebound and no guarding.  Musculoskeletal: Normal range of motion. She exhibits no edema.  Neurological: She is alert and oriented to person, place, and time.  Skin: Skin is warm. No rash noted. No erythema.  Psychiatric: Affect and judgment normal.      LABORATORY PANEL:   CBC  Recent Labs Lab 09/16/15 0522  WBC 10.4  HGB 10.3*  HCT 31.2*  PLT 385   ------------------------------------------------------------------------------------------------------------------  Chemistries   Recent Labs Lab 09/16/15 0522  NA 128*  K 3.9  CL 96*  CO2 24  GLUCOSE 122*  BUN 11  CREATININE 0.95  CALCIUM 8.3*   ------------------------------------------------------------------------------------------------------------------  Cardiac Enzymes No results for input(s): TROPONINI in the last 168 hours. ------------------------------------------------------------------------------------------------------------------  RADIOLOGY:  Dg Chest 2 View  09/15/2015  CLINICAL DATA:  Shortness of breath and cough EXAM: CHEST  2 VIEW COMPARISON:  May 21, 2015 FINDINGS: There is ill-defined opacity in the lateral left base. Lungs  elsewhere clear. Heart size and  pulmonary vascularity are normal. No adenopathy. No bone lesions. IMPRESSION: Ill-defined opacity lateral left base, not present on prior study. This area is felt to represent a focal area of pneumonia. Lungs elsewhere are clear. No change in cardiac silhouette. Followup PA and lateral chest radiographs recommended in 3-4 weeks following trial of antibiotic therapy to ensure resolution and exclude underlying malignancy. Electronically Signed   By: Bretta BangWilliam  Woodruff III M.D.   On: 09/15/2015 17:18     ASSESSMENT AND PLAN:   70 year old female with a history of essential hypertension and hyperlipidemia who presents with cough and shortness breath and found to have community-acquired pneumonia.   1. Community-acquired pneumonia: Continue Levaquin day 2 of 6. Follow up on blood and sputum culture.  2. Hyponatremia: This is due to poor by mouth intake and HCTZ use. Start low-dose IV fluids and repeat BMP in a.m. Check TSH.  3. Essential hypertension: Blood pressure is acceptable at this time. Continue Lotensin. 4. GERD: Continue PPI.   Physical therapy consultation for disposition.   Management plans discussed with the patient and she is in agreement.  CODE STATUS: FULL  TOTAL TIME TAKING CARE OF THIS PATIENT: 30 minutes.     POSSIBLE D/C tomorrow, DEPENDING ON CLINICAL CONDITION.   Andersen Mckiver M.D on 09/16/2015 at 12:02 PM  Between 7am to 6pm - Pager - [phone] After 6pm go to [email or web address] - password [email or web address]  [facility] [place] [facility]  Office  [phone]  CC: Primary care physician; Vonita MossMark Crissman, MD  Note: This dictation was prepared with Dragon dictation along with smaller phrase technology. Any transcriptional errors that result from this process are unintentional.

## 2015-09-17 LAB — BASIC METABOLIC PANEL
Anion gap: 7 (ref 5–15)
BUN: 9 mg/dL (ref 6–20)
CALCIUM: 8.4 mg/dL — AB (ref 8.9–10.3)
CHLORIDE: 101 mmol/L (ref 101–111)
CO2: 24 mmol/L (ref 22–32)
CREATININE: 0.89 mg/dL (ref 0.44–1.00)
Glucose, Bld: 107 mg/dL — ABNORMAL HIGH (ref 65–99)
Potassium: 3.8 mmol/L (ref 3.5–5.1)
SODIUM: 132 mmol/L — AB (ref 135–145)

## 2015-09-17 LAB — HIV ANTIBODY (ROUTINE TESTING W REFLEX): HIV SCREEN 4TH GENERATION: NONREACTIVE

## 2015-09-17 MED ORDER — LEVOFLOXACIN 750 MG PO TABS
750.0000 mg | ORAL_TABLET | Freq: Every day | ORAL | Status: DC
  Administered 2015-09-17: 750 mg via ORAL
  Filled 2015-09-17: qty 1

## 2015-09-17 MED ORDER — LEVOFLOXACIN IN D5W 750 MG/150ML IV SOLN
750.0000 mg | Freq: Every day | INTRAVENOUS | Status: DC
  Filled 2015-09-17: qty 150

## 2015-09-17 MED ORDER — LEVOFLOXACIN 750 MG PO TABS: 750.0000 mg | ORAL_TABLET | Freq: Every day | ORAL | Status: DC

## 2015-09-17 MED ORDER — LEVOFLOXACIN IN D5W 750 MG/150ML IV SOLN: 750.0000 mg | Freq: Once | INTRAVENOUS | Status: DC

## 2015-09-17 NOTE — Evaluation (Signed)
Physical Therapy Evaluation Patient Details Name: Patricia Gomez MRN: [id number] DOB: 1945/11/28 Today's Date: 09/17/2015   History of Present Illness  Pt is a 70 y.o. female with PMH of HTN, CKD, and anxiety.  Pt presented with cough, SOB and fever.  Pt was admitted for pneumonia.      Clinical Impression  Prior to admission pt was independent.  Pt lives alone.  Pt was independent with bed mobility and modified independent with sit to stand with RW (2 sets).  Pt was CGA for ambulation with RW (2x80 feet).  Pt was CGA for stair navigation (4 stairs using L rail).  Due to aforementioned function and strength deficits, pt is in need of skilled physical therapy.  It is recommended that pt is discharged home when medically appropriate.     Follow Up Recommendations No PT follow up    Equipment Recommendations  None recommended by PT    Recommendations for Other Services       Precautions / Restrictions Precautions Precautions: None Restrictions Weight Bearing Restrictions: No      Mobility  Bed Mobility Overal bed mobility: Independent                Transfers Overall transfer level: Modified independent Equipment used: Rolling walker (2 wheeled)                Ambulation/Gait Ambulation/Gait assistance: Min guard Ambulation Distance (Feet):  (2x80) Assistive device: Rolling walker (2 wheeled) Gait Pattern/deviations: WFL(Within Functional Limits) Gait velocity: decreased       Stairs Stairs: Yes Stairs assistance: Min guard Stair Management: Step to pattern Number of Stairs: 4 (L Rail )    Wheelchair Mobility    Modified Rankin (Stroke Patients Only)       Balance Overall balance assessment: Needs assistance Sitting-balance support: Feet supported Sitting balance-Leahy Scale: Normal     Standing balance support: Bilateral upper extremity supported (RW ) Standing balance-Leahy Scale: Normal                                Pertinent Vitals/Pain Pain Assessment: No/denies pain  See flow sheet for vitals.     Home Living Family/patient expects to be discharged to:: Private residence Living Arrangements: Alone     Home Access: Stairs to enter Entrance Stairs-Rails: Left Entrance Stairs-Number of Steps: 3 Home Layout: One level Home Equipment: Walker - 2 wheels;Walker - 4 wheels;Wheelchair - manual      Prior Function Level of Independence: Independent               Hand Dominance        Extremity/Trunk Assessment   Upper Extremity Assessment: Overall WFL for tasks assessed           Lower Extremity Assessment: Overall WFL for tasks assessed      Cervical / Trunk Assessment: Normal  Communication   Communication: No difficulties  Cognition Arousal/Alertness: Awake/alert Behavior During Therapy: WFL for tasks assessed/performed Overall Cognitive Status: Within Functional Limits for tasks assessed                      General Comments   Nursing was contacted and cleared pt for physical therapy.  Pt was agreeable and tolerated session well.     Exercises        Assessment/Plan    PT Assessment Patient needs continued PT services  PT Diagnosis Difficulty walking  PT Problem List Decreased strength;Decreased mobility;Decreased activity tolerance  PT Treatment Interventions DME instruction;Gait training;Stair training;Functional mobility training;Therapeutic activities;Therapeutic exercise;Patient/family education   PT Goals (Current goals can be found in the Care Plan section) Acute Rehab PT Goals Patient Stated Goal: to go home  PT Goal Formulation: With patient Time For Goal Achievement: 10/01/15 Potential to Achieve Goals: Good    Frequency Min 2X/week   Barriers to discharge        Co-evaluation               End of Session Equipment Utilized During Treatment: Gait belt Activity Tolerance: Patient tolerated treatment well Patient left:  in bed;with call bell/phone within reach;with bed alarm set Nurse Communication: Mobility status         Time: [id number] PT Time Calculation (min) (ACUTE ONLY): 21 min   Charges:         PT G Codes:       Patricia Gomez, SPT Patricia Gomez 09/17/2015, 11:56 AM

## 2015-09-17 NOTE — Discharge Summary (Signed)
[facility] - [place] at [facility]   PATIENT NAME: Patricia Gomez    MR#:  [id number]  DATE OF BIRTH:  02/05/1946  DATE OF ADMISSION:  09/15/2015 ADMITTING PHYSICIAN: Shaune PollackQing Chen, MD  DATE OF DISCHARGE: 09/17/2015  PRIMARY CARE PHYSICIAN: Vonita MossMark Crissman, MD    ADMISSION DIAGNOSIS:  Hypokalemia [E87.6] Hyponatremia [E87.1] Left lower lobe pneumonia [J18.9]  DISCHARGE DIAGNOSIS:  Principal Problem:   Pneumonia   SECONDARY DIAGNOSIS:   Past Medical History  Diagnosis Date  . Hx of migraines   . Anxiety   . Osteopenia   . Neuropathy (HCC)   . GERD (gastroesophageal reflux disease)   . Hyperlipidemia   . Depression   . Hypertension   . Chronic kidney disease   . Anemia   . DJD (degenerative joint disease) of knee   . Asthma   . Shortness of breath dyspnea   . Wheezing   . Incontinence   . Panic attack     HOSPITAL COURSE:    70 year old female with a history of essential hypertension and hyperlipidemia who presents with cough and shortness breath and found to have community-acquired pneumonia.   1. Community-acquired pneumonia: She was started on Levaquin and will continue for total course of 6 days. Blood cultures were negative state.  2. Hyponatremia: This is due to poor by mouth intake and HCTZ use. HCTZ was discontinued for now. Sodium level improved with IV fluids.   3. Essential hypertension: Blood pressure is acceptable at this time. Continue Benzapril. Due to low-sodium HCTZ was discontinued. She needs outpatient follow-up for her blood pressure. 4. GERD: Continue PPI.    DISCHARGE CONDITIONS AND DIET:   Stable for discharge home on a heart healthy diet  CONSULTS OBTAINED:     DRUG ALLERGIES:   Allergies  Allergen Reactions  . Penicillins Anaphylaxis, Rash and Other (See Comments)    Has patient had a PCN reaction causing immediate rash, facial/tongue/throat swelling, SOB or lightheadedness with hypotension: Yes Has  patient had a PCN reaction causing severe rash involving mucus membranes or skin necrosis: No Has patient had a PCN reaction that required hospitalization No Has patient had a PCN reaction occurring within the last 10 years: No If all of the above answers are "NO", then may proceed with Cephalosporin use.  . Flagyl [Metronidazole] Nausea And Vomiting  . Prednisone Diarrhea and Nausea And Vomiting    DISCHARGE MEDICATIONS:   Current Discharge Medication List    START taking these medications   Details  levofloxacin (LEVAQUIN) 750 MG tablet Take 1 tablet (750 mg total) by mouth daily. Qty: 5 tablet, Refills: 0      CONTINUE these medications which have NOT CHANGED   Details  albuterol (PROVENTIL HFA;VENTOLIN HFA) 108 (90 BASE) MCG/ACT inhaler Inhale 2 puffs into the lungs every 6 (six) hours as needed for wheezing or shortness of breath. Qty: 2 Inhaler, Refills: 6    aspirin EC 81 MG tablet Take 81 mg by mouth daily.    benazepril (LOTENSIN) 40 MG tablet Take 1 tablet (40 mg total) by mouth daily. Qty: 30 tablet, Refills: 6    hydrOXYzine (ATARAX/VISTARIL) 25 MG tablet Take 25 mg by mouth 3 (three) times daily as needed (for muscle cramps).    omeprazole (PRILOSEC) 20 MG capsule Take 20 mg by mouth daily.      STOP taking these medications     hydrochlorothiazide (MICROZIDE) 12.5 MG capsule      meloxicam (MOBIC) 15 MG tablet  diazepam (VALIUM) 5 MG tablet      traMADol (ULTRAM) 50 MG tablet               Today   CHIEF COMPLAINT:  Patient doing well this point. Patient is ready to be discharged home.   VITAL SIGNS:  Blood pressure 148/57, pulse 88, temperature 97.8 F (36.6 C), temperature source Oral, resp. rate 17, height  (1.549 m), weight 86.637 kg (191 lb), SpO2 98 %.   REVIEW OF SYSTEMS:  Review of Systems  Constitutional: Negative for fever, chills and malaise/fatigue.  HENT: Negative for ear discharge, ear pain, hearing loss,  nosebleeds and sore throat.   Eyes: Negative for blurred vision and pain.  Respiratory: Negative for cough, hemoptysis, shortness of breath and wheezing.   Cardiovascular: Negative for chest pain, palpitations and leg swelling.  Gastrointestinal: Negative for nausea, vomiting, abdominal pain, diarrhea and blood in stool.  Genitourinary: Negative for dysuria.  Musculoskeletal: Negative for back pain.  Neurological: Negative for dizziness, tremors, speech change, focal weakness, seizures and headaches.  Endo/Heme/Allergies: Does not bruise/bleed easily.  Psychiatric/Behavioral: Negative for depression, suicidal ideas and hallucinations.     PHYSICAL EXAMINATION:  GENERAL:  70 y.o.-year-old patient lying in the bed with no acute distress.  NECK:  Supple, no jugular venous distention. No thyroid enlargement, no tenderness.  LUNGS: Normal breath sounds bilaterally, no wheezing, rales,rhonchi  No use of accessory muscles of respiration.  CARDIOVASCULAR: S1, S2 normal. No murmurs, rubs, or gallops.  ABDOMEN: Soft, non-tender, non-distended. Bowel sounds present. No organomegaly or mass.  EXTREMITIES: No pedal edema, cyanosis, or clubbing.  PSYCHIATRIC: The patient is alert and oriented x 3.  SKIN: No obvious rash, lesion, or ulcer.   DATA REVIEW:   CBC  Recent Labs Lab 09/16/15 0522  WBC 10.4  HGB 10.3*  HCT 31.2*  PLT 385    Chemistries   Recent Labs Lab 09/17/15 0538  NA 132*  K 3.8  CL 101  CO2 24  GLUCOSE 107*  BUN 9  CREATININE 0.89  CALCIUM 8.4*    Cardiac Enzymes No results for input(s): TROPONINI in the last 168 hours.  Microbiology Results  @  RADIOLOGY:  Dg Chest 2 View  09/15/2015  CLINICAL DATA:  Shortness of breath and cough EXAM: CHEST  2 VIEW COMPARISON:  May 21, 2015 FINDINGS: There is ill-defined opacity in the lateral left base. Lungs elsewhere clear. Heart size and pulmonary vascularity are normal. No adenopathy. No bone lesions.  IMPRESSION: Ill-defined opacity lateral left base, not present on prior study. This area is felt to represent a focal area of pneumonia. Lungs elsewhere are clear. No change in cardiac silhouette. Followup PA and lateral chest radiographs recommended in 3-4 weeks following trial of antibiotic therapy to ensure resolution and exclude underlying malignancy. Electronically Signed   By: Bretta Bang III M.D.   On: 09/15/2015 17:18      Management plans discussed with the patient and she is in agreement. Stable for discharge home  Patient should follow up with PCP in 1 week  CODE STATUS:     Code Status Orders        Start     Ordered   09/15/15 2331  Full code   Continuous     09/15/15 2330    Code Status History    Date Active Date Inactive Code Status Order ID Comments User Context   This patient has a current code status but no historical code status.  TOTAL TIME TAKING CARE OF THIS PATIENT: 35 minutes.    Note: This dictation was prepared with Dragon dictation along with smaller phrase technology. Any transcriptional errors that result from this process are unintentional.  Elanie Hammitt M.D on 09/17/2015 at 11:32 AM  Between 7am to 6pm - Pager - [phone] After 6pm go to [email or web address] - password [email or web address] [facility]  [place] [facility]  Office  [phone]  CC: Primary care physician; Vonita Moss, MD

## 2015-09-17 NOTE — Progress Notes (Signed)
Spoke with Dr. Juliene PinaMody and informed her that the patient's IV was leaking and was due an IV dose of Levaquin. Order received to change IV dose to Levaquin 750 mg oral once.

## 2015-09-17 NOTE — Care Management (Signed)
Met with patient that is eager to return home today. She disagrees with home health. She states she has a walker but has not used it at home- she agrees to using it now. Her PCP is Dr. Brandon Melnick but she claims that she is trying to find a new MD. I advised her to work on that aggressively as she will need a PCP- she agrees. She lives alone. She states she has a Marine scientist that lives next door. She has a friend that will provide transportation home today and assist her with a "warm bath". No further RNCM needs. Case closed.

## 2015-09-17 NOTE — Progress Notes (Signed)
DISCHARGE NOTE:  Pt given discharge instructions and prescriptions. Pt verbalized understanding.  

## 2015-09-17 NOTE — Care Management Important Message (Signed)
Important Message  Patient Details  Name: Patricia Gomez MRN: [id number] Date of Birth: 04/17/1946   Medicare Important Message Given:  Yes    Olegario MessierKathy A Desi Carby 09/17/2015, 10:01 AM

## 2015-09-18 ENCOUNTER — Telehealth: Payer: Self-pay | Admitting: Family Medicine

## 2015-09-18 NOTE — Telephone Encounter (Signed)
Patient notified that she needs to go back to the hospital, patient states that she is not going back to the hospital, told me thank you and hung up on me.  Spoke with patient's daughter, she is not local, told patients daughter that we strongly recommend that she go back to the hospital.  Spoke with patient's neighbor, she is not able to take care of the patient as much as possible, she states that the patient is very stubborn and that she will try and talk her into going back to the hospital.

## 2015-09-18 NOTE — Telephone Encounter (Addendum)
We are aware. Forwarded to PCP to make him aware of situation.

## 2015-09-18 NOTE — Telephone Encounter (Signed)
Pt called stated she is having a bad reaction to a medication, stated she is afraid to go to sleep, states she has bad nightmares, night sweats, light headedness, migraine, and pain in the back of her neck, and that her heart is beating so hard she thinks she is going to have a heart attack. Please call pt ASAP. Thanks.

## 2015-09-20 LAB — CULTURE, BLOOD (ROUTINE X 2)
Culture: NO GROWTH
Culture: NO GROWTH

## 2015-09-22 ENCOUNTER — Telehealth: Payer: Self-pay | Admitting: Family Medicine

## 2015-09-22 NOTE — Telephone Encounter (Signed)
Phone call Discussed with patient her extreme dissatisfaction over me not given her medicine for nerves and not listening to her she wants to go to another doctor at another practice. I told her we would be able to assist her if she wanted. Her records would already be there with the Epic medical record system. Patient also with worsening bronchitis was unable to take her medication given at the hospital I encouraged her to go back to the emergency room.

## 2015-09-22 NOTE — Telephone Encounter (Signed)
Apt pts phone call too much to handle over the phone

## 2015-09-22 NOTE — Telephone Encounter (Signed)
Call pt 

## 2015-09-22 NOTE — Telephone Encounter (Signed)
Pt called in and stated that she needed something to take for her nerves, pain(no narcotic). She also stated that she had a bacterial infection in her blood that she needed an antibiotic for.  She would also like something for sleep because she hasn't slept but an hour for the last 3 weeks.

## 2015-09-22 NOTE — Telephone Encounter (Signed)
09/29/15 @ 3:30pm, nothing sooner available.

## 2015-09-26 ENCOUNTER — Encounter: Payer: Self-pay | Admitting: Emergency Medicine

## 2015-09-26 ENCOUNTER — Emergency Department
Admission: EM | Admit: 2015-09-26 | Discharge: 2015-09-26 | Disposition: A | Discharge status: Home / Self Care | Payer: Medicare Other | Attending: Emergency Medicine | Admitting: Emergency Medicine

## 2015-09-26 ENCOUNTER — Emergency Department: Payer: Medicare Other

## 2015-09-26 DIAGNOSIS — F418 Other specified anxiety disorders: Secondary | ICD-10-CM | POA: Diagnosis not present

## 2015-09-26 DIAGNOSIS — R531 Weakness: Secondary | ICD-10-CM | POA: Insufficient documentation

## 2015-09-26 DIAGNOSIS — K219 Gastro-esophageal reflux disease without esophagitis: Secondary | ICD-10-CM | POA: Diagnosis not present

## 2015-09-26 DIAGNOSIS — Z7982 Long term (current) use of aspirin: Secondary | ICD-10-CM | POA: Diagnosis not present

## 2015-09-26 DIAGNOSIS — G43009 Migraine without aura, not intractable, without status migrainosus: Secondary | ICD-10-CM | POA: Insufficient documentation

## 2015-09-26 DIAGNOSIS — Z96653 Presence of artificial knee joint, bilateral: Secondary | ICD-10-CM | POA: Diagnosis not present

## 2015-09-26 DIAGNOSIS — Z79899 Other long term (current) drug therapy: Secondary | ICD-10-CM | POA: Diagnosis not present

## 2015-09-26 DIAGNOSIS — M6281 Muscle weakness (generalized): Secondary | ICD-10-CM | POA: Diagnosis not present

## 2015-09-26 DIAGNOSIS — N189 Chronic kidney disease, unspecified: Secondary | ICD-10-CM | POA: Insufficient documentation

## 2015-09-26 DIAGNOSIS — G629 Polyneuropathy, unspecified: Secondary | ICD-10-CM

## 2015-09-26 DIAGNOSIS — I129 Hypertensive chronic kidney disease with stage 1 through stage 4 chronic kidney disease, or unspecified chronic kidney disease: Secondary | ICD-10-CM | POA: Diagnosis not present

## 2015-09-26 DIAGNOSIS — E785 Hyperlipidemia, unspecified: Secondary | ICD-10-CM | POA: Diagnosis not present

## 2015-09-26 DIAGNOSIS — R0600 Dyspnea, unspecified: Secondary | ICD-10-CM | POA: Diagnosis not present

## 2015-09-26 DIAGNOSIS — R062 Wheezing: Secondary | ICD-10-CM | POA: Diagnosis not present

## 2015-09-26 DIAGNOSIS — R110 Nausea: Secondary | ICD-10-CM | POA: Diagnosis not present

## 2015-09-26 DIAGNOSIS — Z87891 Personal history of nicotine dependence: Secondary | ICD-10-CM | POA: Diagnosis not present

## 2015-09-26 DIAGNOSIS — R0602 Shortness of breath: Secondary | ICD-10-CM | POA: Diagnosis not present

## 2015-09-26 LAB — TROPONIN I

## 2015-09-26 LAB — CBC WITH DIFFERENTIAL/PLATELET
Basophils Absolute: 0.1 10*3/uL (ref 0–0.1)
Basophils Relative: 1 %
EOS PCT: 1 %
Eosinophils Absolute: 0.1 10*3/uL (ref 0–0.7)
HEMATOCRIT: 29.5 % — AB (ref 35.0–47.0)
Hemoglobin: 9.7 g/dL — ABNORMAL LOW (ref 12.0–16.0)
LYMPHS ABS: 1.3 10*3/uL (ref 1.0–3.6)
LYMPHS PCT: 15 %
MCH: 23.8 pg — AB (ref 26.0–34.0)
MCHC: 32.8 g/dL (ref 32.0–36.0)
MCV: 72.5 fL — AB (ref 80.0–100.0)
MONO ABS: 0.5 10*3/uL (ref 0.2–0.9)
Monocytes Relative: 6 %
Neutro Abs: 6.4 10*3/uL (ref 1.4–6.5)
Neutrophils Relative %: 77 %
PLATELETS: 394 10*3/uL (ref 150–440)
RBC: 4.07 MIL/uL (ref 3.80–5.20)
RDW: 16.9 % — AB (ref 11.5–14.5)
WBC: 8.4 10*3/uL (ref 3.6–11.0)

## 2015-09-26 LAB — URINALYSIS COMPLETE WITH MICROSCOPIC (ARMC ONLY)
Bilirubin Urine: NEGATIVE
GLUCOSE, UA: NEGATIVE mg/dL
Ketones, ur: NEGATIVE mg/dL
Leukocytes, UA: NEGATIVE
Nitrite: NEGATIVE
Protein, ur: NEGATIVE mg/dL
Specific Gravity, Urine: 1.01 (ref 1.005–1.030)
pH: 6 (ref 5.0–8.0)

## 2015-09-26 LAB — COMPREHENSIVE METABOLIC PANEL
ALBUMIN: 3.3 g/dL — AB (ref 3.5–5.0)
ALK PHOS: 164 U/L — AB (ref 38–126)
ALT: 15 U/L (ref 14–54)
AST: 23 U/L (ref 15–41)
Anion gap: 9 (ref 5–15)
BUN: 10 mg/dL (ref 6–20)
CALCIUM: 8.4 mg/dL — AB (ref 8.9–10.3)
CHLORIDE: 95 mmol/L — AB (ref 101–111)
CO2: 26 mmol/L (ref 22–32)
CREATININE: 0.76 mg/dL (ref 0.44–1.00)
GFR calc non Af Amer: >60 mL/min (ref >60)
Glucose, Bld: 139 mg/dL — ABNORMAL HIGH (ref 65–99)
Potassium: 3.4 mmol/L — ABNORMAL LOW (ref 3.5–5.1)
SODIUM: 130 mmol/L — AB (ref 135–145)
TOTAL PROTEIN: 6.7 g/dL (ref 6.5–8.1)
Total Bilirubin: 0.5 mg/dL (ref 0.3–1.2)

## 2015-09-26 MED ORDER — IPRATROPIUM-ALBUTEROL 0.5-2.5 (3) MG/3ML IN SOLN
3.0000 mL | Freq: Once | RESPIRATORY_TRACT | Status: AC
  Administered 2015-09-26: 3 mL via RESPIRATORY_TRACT
  Filled 2015-09-26: qty 3

## 2015-09-26 MED ORDER — ONDANSETRON HCL 4 MG PO TABS: 4.0000 mg | ORAL_TABLET | Freq: Three times a day (TID) | ORAL | Status: DC | PRN

## 2015-09-26 MED ORDER — ALBUTEROL SULFATE HFA 108 (90 BASE) MCG/ACT IN AERS: 2.0000 | INHALATION_SPRAY | Freq: Four times a day (QID) | RESPIRATORY_TRACT | Status: AC | PRN

## 2015-09-26 NOTE — ED Provider Notes (Signed)
[facility] Emergency Department Provider Note   ____________________________________________  Time seen: Approximately 7:20 AM I have reviewed the triage vital signs and the triage nursing note.  HISTORY  Chief Complaint Shortness of Breath   Historian Patient  HPI Patricia Gomez is a 70 y.o. female who was recently treated in the hospital for left lower lobe pneumonia and discharged 09/17/15, to complete 5 more tablets of Levaquin for a 7 day course. She states that she only took 2 additional tablets at home but is making her nauseated and she just stopped. Over the course the next week or so she is continued to have shortness of breath, but doesn't really report productive cough or fever. She's felt generalized weakness, and states she kept waiting and waiting hoping it would just get better, but for the last 3 days it's been worse and worse.  Her 2 main complaints are generalized weakness making a little for her to walk around on her own, she does live alone. Her other complaint is the shortness of breath which sounds somewhat similar to when she was diagnosed with pneumonia, and she did not finish her course of antibiotic.    Past Medical History  Diagnosis Date  . Hx of migraines   . Anxiety   . Osteopenia   . Neuropathy (HCC)   . GERD (gastroesophageal reflux disease)   . Hyperlipidemia   . Depression   . Hypertension   . Chronic kidney disease   . Anemia   . DJD (degenerative joint disease) of knee   . Asthma   . Shortness of breath dyspnea   . Wheezing   . Incontinence   . Panic attack     Patient Active Problem List   Diagnosis Date Noted  . Pneumonia 09/15/2015  . Anxiety 08/20/2015  . Asthma 05/06/2015  . Anemia   . Hyperlipidemia   . Depression   . Osteopenia   . Essential hypertension   . Chronic kidney disease   . Neuropathy (HCC)   . DJD (degenerative joint disease) of knee   . Failed back surgical syndrome   . Vaginal  atrophy 12/16/2014  . DDD (degenerative disc disease), cervical 10/21/2014  . DDD (degenerative disc disease), lumbar 10/21/2014  . Facet syndrome 10/21/2014  . Degenerative joint disease of sacroiliac joint 10/21/2014  . Bilateral occipital neuralgia 10/21/2014    Past Surgical History  Procedure Laterality Date  . Replacement total knee bilateral Bilateral 2011  . Abdominal hysterectomy    . Eye surgery Bilateral 2013  . Oophorectomy      with tumor removed before hysterectomy  . Tubal ligation    . Joint replacement      bilateral knees  . Breast enhancement surgery    . Intraocular lens insertion Bilateral   . Carpometacarpal (cmc) fusion of thumb Left 05/21/2015    Procedure: CARPOMETACARPAL (CMC) FUSION OF THUMB;  Surgeon: Christena Flake, MD;  Location: [facility] ORS;  Service: Orthopedics;  Laterality: Left;  . Ganglion cyst excision Left 05/21/2015    Procedure: REMOVAL GANGLION OF WRIST;  Surgeon: Christena Flake, MD;  Location: [facility] ORS;  Service: Orthopedics;  Laterality: Left;    Current Outpatient Rx  Name  Route  Sig  Dispense  Refill  . albuterol (PROVENTIL HFA;VENTOLIN HFA) 108 (90 BASE) MCG/ACT inhaler   Inhalation   Inhale 2 puffs into the lungs every 6 (six) hours as needed for wheezing or shortness of breath.   2 Inhaler  6   . albuterol (PROVENTIL HFA;VENTOLIN HFA) 108 (90 Base) MCG/ACT inhaler   Inhalation   Inhale 2 puffs into the lungs every 6 (six) hours as needed for wheezing or shortness of breath.   1 Inhaler   0   . aspirin EC 81 MG tablet   Oral   Take 81 mg by mouth daily.         . benazepril (LOTENSIN) 40 MG tablet   Oral   Take 1 tablet (40 mg total) by mouth daily.   30 tablet   6   . hydrOXYzine (ATARAX/VISTARIL) 25 MG tablet   Oral   Take 25 mg by mouth 3 (three) times daily as needed (for muscle cramps).         Marland Kitchen levofloxacin (LEVAQUIN) 750 MG tablet   Oral   Take 1 tablet (750 mg total) by mouth daily.   5 tablet   0   .  omeprazole (PRILOSEC) 20 MG capsule   Oral   Take 20 mg by mouth daily.         . ondansetron (ZOFRAN) 4 MG tablet   Oral   Take 1 tablet (4 mg total) by mouth every 8 (eight) hours as needed for nausea or vomiting.   10 tablet   0     Allergies Penicillins; Flagyl; and Prednisone  Family History  Problem Relation Age of Onset  . Muscular dystrophy Sister   . Heart disease Sister   . Mental illness Brother   . Heart disease Mother   . Mental illness Mother   . Heart attack Father     Social History Social History  Substance Use Topics  . Smoking status: Former Smoker    Quit date: 10/20/2008  . Smokeless tobacco: Never Used  . Alcohol Use: No    Review of Systems  Constitutional: Negative for fever. Eyes: Negative for visual changes. ENT: Negative for sore throat. Cardiovascular: Positive for chest pain occasionally, central. Respiratory: Positive for shortness of breath. Gastrointestinal: Negative for abdominal pain, vomiting and diarrhea. Genitourinary: Negative for dysuria. Musculoskeletal: Negative for back pain. Skin: Negative for rash. Neurological: Negative for headache. 10 point Review of Systems otherwise negative ____________________________________________   PHYSICAL EXAM:  VITAL SIGNS: ED Triage Vitals  Enc Vitals Group     BP 09/26/15 0720 138/62 mmHg     Pulse Rate 09/26/15 0720 86     Resp 09/26/15 0720 17     Temp 09/26/15 0720 97.9 F (36.6 C)     Temp Source 09/26/15 0720 Oral     SpO2 09/26/15 0720 99 %     Weight 09/26/15 0720 196 lb 6.9 oz (89.1 kg)     Height 09/26/15 0720  (1.549 m)     Head Cir --      Peak Flow --      Pain Score 09/26/15 0722 5     Pain Loc --      Pain Edu? --      Excl. in GC? --      Constitutional: Alert and oriented. No acute distress. HEENT   Head: Normocephalic and atraumatic.      Eyes: Conjunctivae are normal. PERRL. Normal extraocular movements.      Ears:         Nose: No  congestion/rhinnorhea.   Mouth/Throat: Mucous membranes are moist.   Neck: No stridor. Cardiovascular/Chest: Normal rate, regular rhythm.  No murmurs, rubs, or gallops. Respiratory: Normal respiratory effort without tachypnea nor  retractions. Mild wheezing throughout all fields. Gastrointestinal: Soft. No distention, no guarding, no rebound. Nontender.   Genitourinary/rectal:Deferred Musculoskeletal: Nontender with normal range of motion in all extremities. No joint effusions.  No lower extremity tenderness.  No edema. Neurologic:  Normal speech and language. No gross or focal neurologic deficits are appreciated. Skin:  Skin is warm, dry and intact. No rash noted. Psychiatric: Mood and affect are normal. Speech and behavior are normal. Patient exhibits appropriate insight and judgment.  ____________________________________________   EKG I, Governor Rooks, MD, the attending physician have personally viewed and interpreted all ECGs.  84 bpm. Normal sinus rhythm narrow QRS. Normal axis. Nonspecific T-wave ____________________________________________  LABS (pertinent positives/negatives)  Comprehensive metabolic panel significant for sodium 130, potassium 3.4, otherwise without significant abnormalities and troponin less than 0.03 White blood count 8.4, hemoglobin is 9.7, platelet count 394  ____________________________________________  RADIOLOGY All Xrays were viewed by me. Imaging interpreted by Radiologist.  Chest two-view:  IMPRESSION: 1. Mild changes of COPD and chronic bronchitis. 2. Minimal left basilar linear atelectasis. __________________________________________  PROCEDURES  Procedure(s) performed: None  Critical Care performed: None  ____________________________________________   ED COURSE / ASSESSMENT AND PLAN  Pertinent labs & imaging results that were available during my care of the patient were reviewed by me and considered in my medical decision making  (see chart for details).  Patricia Gomez is here for evaluation for not feeling back to baseline after she was in the hospital a week and half ago or so for pneumonia. It sounds like her main complaint is generalized weakness and decreased by mouth intake. She has chronic intermittent nausea. She has chronic wheezing and mild chronic shortness of breath, which doesn't really seem worse than when she left the hospital, but isn't 100 percent back to baseline. Although she is complaining of some mild shortness of breath, it seems like her main complaint is decreased. Intake and generalized weakness. There is no focal complaint to make me concerned about stroke or intracranial abnormalities.  She is not hypoxic or febrile. Her chest x-ray is clear for no persistent pneumonia. Her white blood cell count is normal. She had hyponatremia and hypokalemia on hospital visit, but these are improved from her hospital stay.  Urinalysis shows no urinary infection.  Nurse witnessed patient walking to bathroom and back, and she was able to do this. I also discussed with patient since she lives alone and she is feeling she is able to take care of herself, and she states that she is and does want to go back home.  I discussed with her her reassuring exam and evaluation and she is going to call in the [facility] today. Neck/she is asking for a refill prescription for albuterol, as well as nausea medication. She states that she has chronic pain and had previously been on tramadol, as well as Percocet, amitriptyline, and it sounds like she tapered herself off of these. She is complaining of what sounds like neuropathic pain to bilateral lower extremities. There is no calf tenderness or swelling or redness and patient does not think she has a blood clot in her leg and does not want further evaluation for this such as an ultrasound.  In terms of the nausea, this seems chronic and intermittent, or abdominal exam is reassuring  and soft. There is no fever or elevated white blood count. I don't suspect intra-abdominal emergency and I discussed with her avoiding CT the abdomen this point time and she feels quite comfortable with this  plan of action.  She has an appointment to discuss on Monday obtaining a new primary care physician and critical clinic. I have also provided the office number for neurology, in terms of further evaluation for her neuropathic pain in her legs.     CONSULTATIONS:   None   Patient / Family / Caregiver informed of clinical course, medical decision-making process, and agree with plan.   I discussed return precautions, follow-up instructions, and discharged instructions with patient and/or family.   ___________________________________________   FINAL CLINICAL IMPRESSION(S) / ED DIAGNOSES   Final diagnoses:  Generalized weakness  Nausea  Neuropathy (HCC)  Wheezing              Note: This dictation was prepared with Dragon dictation. Any transcriptional errors that result from this process are unintentional   Governor Rooksebecca Herberth Deharo, MD 09/26/15 [phone]

## 2015-09-26 NOTE — ED Notes (Signed)
Patient arrived via EMS from home alone. Pt states she was released from hospital on 4/6 after dx with PNA.  Patient was given RX for Levaquin 5 tabs and only took 3 tabs.  She states she started feeling short of breath a few days after discharge.  Patient also reports no sleep for the past month.  She was given medication for sleep and itching but did not like the way she felt.  She states her feet hurt and left hand hurts where she had surgery.  Patient c/o chest pain as well as muscle spasms. She reports feeling bloated and having gas.  She also states she has been urinating 6-8 times a night.

## 2015-09-26 NOTE — ED Notes (Signed)
Dr. Shaune PollackLord notified patient requesting nausea medication and pain medication for her feet.  Informed Dr. Shaune PollackLord patient was able to ambulate to the bathroom with minimal assistance.

## 2015-09-26 NOTE — Discharge Instructions (Signed)
You were evaluated for generalized weakness, and although no certain cause was found, and your exam and evaluation are reassuring in the emergency department today.  We discussed, is very important that you get follow-up with primary care physician at [facility] for further management. You're being prescribed a nausea medicine called Zofran for as needed nausea. You are also prescribed a refill on albuterol inhaler for COPD/wheezing.  Return to the emergency room for any new or worsening condition including any trouble breathing, chest pain, abdominal pain, weakness or numbness, confusion or altered mental status, dizziness or passing out, or any other symptoms concerning to you.   Weakness Weakness is a lack of strength. You may feel weak all over your body or just in one part of your body. Weakness can be serious. In some cases, you may need more medical tests. HOME CARE  Rest.  Eat a well-balanced diet.  Try to exercise every day.  Only take medicines as told by your doctor. GET HELP RIGHT AWAY IF:   You cannot do your normal daily activities.  You cannot walk up and down stairs, or you feel very tired when you do so.  You have shortness of breath or chest pain.  You have trouble moving parts of your body.  You have weakness in only one body part or on only one side of the body.  You have a fever.  You have trouble speaking or swallowing.  You cannot control when you pee (urinate) or poop (bowel movement).  You have black or bloody throw up (vomit) or poop.  Your weakness gets worse or spreads to other body parts.  You have new aches or pains. MAKE SURE YOU:   Understand these instructions.  Will watch your condition.  Will get help right away if you are not doing well or get worse.   This information is not intended to replace advice given to you by your health care provider. Make sure you discuss any questions you have with your health care provider.     Document Released: 05/12/2008 Document Revised: 11/29/2011 Document Reviewed: 07/29/2011 Elsevier Interactive Patient Education 2016 Else[employer] Inc[employer]  Peripheral Neuropathy Peripheral neuropathy is a type of nerve damage. It affects nerves that carry signals between the spinal cord and other parts of the body. These are called peripheral nerves. With peripheral neuropathy, one nerve or a group of nerves may be damaged.  CAUSES  Many things can damage peripheral nerves. For some people with peripheral neuropathy, the cause is unknown. Some causes include:  Diabetes. This is the most common cause of peripheral neuropathy.  Injury to a nerve.  Pressure or stress on a nerve that lasts a long time.  Too little vitamin B. Alcoholism can lead to this.  Infections.  Autoimmune diseases, such as multiple sclerosis and systemic lupus erythematosus.  Inherited nerve diseases.  Some medicines, such as cancer drugs.  Toxic substances, such as lead and mercury.  Too little blood flowing to the legs.  Kidney disease.  Thyroid disease. SIGNS AND SYMPTOMS  Different people have different symptoms. The symptoms you have will depend on which of your nerves is damaged. Common symptoms include:  Loss of feeling (numbness) in the feet and hands.  Tingling in the feet and hands.  Pain that burns.  Very sensitive skin.  Weakness.  Not being able to move a part of the body (paralysis).  Muscle twitching.  Clumsiness or poor coordination.  Loss of balance.  Not being able to control  your bladder.  Feeling dizzy.  Sexual problems. DIAGNOSIS  Peripheral neuropathy is a symptom, not a disease. Finding the cause of peripheral neuropathy can be hard. To figure that out, your health care provider will take a medical history and do a physical exam. A neurological exam will also be done. This involves checking things affected by your brain, spinal cord, and nerves (nervous system). For  example, your health care provider will check your reflexes, how you move, and what you can feel.  Other types of tests may also be ordered, such as:  Blood tests.  A test of the fluid in your spinal cord.  Imaging tests, such as CT scans or an MRI.  Electromyography (EMG). This test checks the nerves that control muscles.  Nerve conduction velocity tests. These tests check how fast messages pass through your nerves.  Nerve biopsy. A small piece of nerve is removed. It is then checked under a microscope. TREATMENT   Medicine is often used to treat peripheral neuropathy. Medicines may include:  Pain-relieving medicines. Prescription or over-the-counter medicine may be suggested.  Antiseizure medicine. This may be used for pain.  Antidepressants. These also may help ease pain from neuropathy.  Lidocaine. This is a numbing medicine. You might wear a patch or be given a shot.  Mexiletine. This medicine is typically used to help control irregular heart rhythms.  Surgery. Surgery may be needed to relieve pressure on a nerve or to destroy a nerve that is causing pain.  Physical therapy to help movement.  Assistive devices to help movement. HOME CARE INSTRUCTIONS   Only take over-the-counter or prescription medicines as directed by your health care provider. Follow the instructions carefully for any given medicines. Do not take any other medicines without first getting approval from your health care provider.  If you have diabetes, work closely with your health care provider to keep your blood sugar under control.  If you have numbness in your feet:  Check every day for signs of injury or infection. Watch for redness, warmth, and swelling.  Wear padded socks and comfortable shoes. These help protect your feet.  Do not do things that put pressure on your damaged nerve.  Do not smoke. Smoking keeps blood from getting to damaged nerves.  Avoid or limit alcohol. Too much alcohol  can cause a lack of B vitamins. These vitamins are needed for healthy nerves.  Develop a good support system. Coping with peripheral neuropathy can be stressful. Talk to a mental health specialist or join a support group if you are struggling.  Follow up with your health care provider as directed. SEEK MEDICAL CARE IF:   You have new signs or symptoms of peripheral neuropathy.  You are struggling emotionally from dealing with peripheral neuropathy.  You have a fever. SEEK IMMEDIATE MEDICAL CARE IF:   You have an injury or infection that is not healing.  You feel very dizzy or begin vomiting.  You have chest pain.  You have trouble breathing.   This information is not intended to replace advice given to you by your health care provider. Make sure you discuss any questions you have with your health care provider.   Document Released: 05/20/2002 Document Revised: 02/09/2011 Document Reviewed: 02/04/2013 Elsevier Interactive Patient Education 2016 Elsevier Inc.  Bronchospasm, Adult A bronchospasm is a spasm or tightening of the airways going into the lungs. During a bronchospasm breathing becomes more difficult because the airways get smaller. When this happens there can be coughing, a  whistling sound when breathing (wheezing), and difficulty breathing. Bronchospasm is often associated with asthma, but not all patients who experience a bronchospasm have asthma. CAUSES  A bronchospasm is caused by inflammation or irritation of the airways. The inflammation or irritation may be triggered by:   Allergies (such as to animals, pollen, food, or mold). Allergens that cause bronchospasm may cause wheezing immediately after exposure or many hours later.   Infection. Viral infections are believed to be the most common cause of bronchospasm.   Exercise.   Irritants (such as pollution, cigarette smoke, strong odors, aerosol sprays, and paint fumes).   Weather changes. Winds increase  molds and pollens in the air. Rain refreshes the air by washing irritants out. Cold air may cause inflammation.   Stress and emotional upset.  SIGNS AND SYMPTOMS   Wheezing.   Excessive nighttime coughing.   Frequent or severe coughing with a simple cold.   Chest tightness.   Shortness of breath.  DIAGNOSIS  Bronchospasm is usually diagnosed through a history and physical exam. Tests, such as chest X-rays, are sometimes done to look for other conditions. TREATMENT   Inhaled medicines can be given to open up your airways and help you breathe. The medicines can be given using either an inhaler or a nebulizer machine.  Corticosteroid medicines may be given for severe bronchospasm, usually when it is associated with asthma. HOME CARE INSTRUCTIONS   Always have a plan prepared for seeking medical care. Know when to call your health care provider and local emergency services (911 in the [place] Know where you can access local emergency care.  Only take medicines as directed by your health care provider.  If you were prescribed an inhaler or nebulizer machine, ask your health care provider to explain how to use it correctly. Always use a spacer with your inhaler if you were given one.  It is necessary to remain calm during an attack. Try to relax and breathe more slowly.  Control your home environment in the following ways:   Change your heating and air conditioning filter at least once a month.   Limit your use of fireplaces and wood stoves.  Do not smoke and do not allow smoking in your home.   Avoid exposure to perfumes and fragrances.   Get rid of pests (such as roaches and mice) and their droppings.   Throw away plants if you see mold on them.   Keep your house clean and dust free.   Replace carpet with wood, tile, or vinyl flooring. Carpet can trap dander and dust.   Use allergy-proof pillows, mattress covers, and box spring covers.   Wash bed sheets  and blankets every week in hot water and dry them in a dryer.   Use blankets that are made of polyester or cotton.   Wash hands frequently. SEEK MEDICAL CARE IF:   You have muscle aches.   You have chest pain.   The sputum changes from clear or white to yellow, green, gray, or bloody.   The sputum you cough up gets thicker.   There are problems that may be related to the medicine you are given, such as a rash, itching, swelling, or trouble breathing.  SEEK IMMEDIATE MEDICAL CARE IF:   You have worsening wheezing and coughing even after taking your prescribed medicines.   You have increased difficulty breathing.   You develop severe chest pain. MAKE SURE YOU:   Understand these instructions.  Will watch your condition.  Will  get help right away if you are not doing well or get worse.   This information is not intended to replace advice given to you by your health care provider. Make sure you discuss any questions you have with your health care provider.   Document Released: 06/02/2003 Document Revised: 06/20/2014 Document Reviewed: 11/19/2012 Elsevier Interactive Patient Education 2016 Elsevier Inc.   Nausea, Adult Nausea means you feel sick to your stomach or need to throw up (vomit). It may be a sign of a more serious problem. If nausea gets worse, you may throw up. If you throw up a lot, you may lose too much body fluid (dehydration). HOME CARE   Get plenty of rest.  Ask your doctor how to replace body fluid losses (rehydrate).  Eat small amounts of food. Sip liquids more often.  Take all medicines as told by your doctor. GET HELP RIGHT AWAY IF:  You have a fever.  You pass out (faint).  You keep throwing up or have blood in your throw up.  You are very weak, have dry lips or a dry mouth, or you are very thirsty (dehydrated).  You have dark or bloody poop (stool).  You have very bad chest or belly (abdominal) pain.  You do not get better after  2 days, or you get worse.  You have a headache. MAKE SURE YOU:  Understand these instructions.  Will watch your condition.  Will get help right away if you are not doing well or get worse.   This information is not intended to replace advice given to you by your health care provider. Make sure you discuss any questions you have with your health care provider.   Document Released: 05/19/2011 Document Revised: 08/22/2011 Document Reviewed: 05/19/2011 Elsevier Interactive Patient Education [employer]

## 2015-09-26 NOTE — ED Notes (Signed)
Patient transported to X-ray 

## 2015-09-28 ENCOUNTER — Inpatient Hospital Stay: Payer: Medicare Other | Admitting: Family Medicine

## 2015-09-29 ENCOUNTER — Inpatient Hospital Stay: Payer: Medicare Other | Admitting: Family Medicine

## 2015-09-29 ENCOUNTER — Telehealth: Payer: Self-pay | Admitting: Emergency Medicine

## 2015-09-29 NOTE — ED Notes (Signed)
Pt spoke to ed secretary last night saying she did not receive her prescriptions.  i called them to [facility] [address]  (albuterol hfa and ondansetron tabs)

## 2015-10-07 ENCOUNTER — Other Ambulatory Visit: Payer: Self-pay | Admitting: Internal Medicine

## 2015-10-07 DIAGNOSIS — R5383 Other fatigue: Secondary | ICD-10-CM | POA: Diagnosis not present

## 2015-10-07 DIAGNOSIS — I10 Essential (primary) hypertension: Secondary | ICD-10-CM | POA: Diagnosis not present

## 2015-10-07 DIAGNOSIS — J449 Chronic obstructive pulmonary disease, unspecified: Secondary | ICD-10-CM | POA: Diagnosis not present

## 2015-10-07 DIAGNOSIS — E875 Hyperkalemia: Secondary | ICD-10-CM | POA: Diagnosis not present

## 2015-10-07 DIAGNOSIS — K219 Gastro-esophageal reflux disease without esophagitis: Secondary | ICD-10-CM | POA: Diagnosis not present

## 2015-10-07 DIAGNOSIS — E876 Hypokalemia: Secondary | ICD-10-CM | POA: Diagnosis not present

## 2015-10-08 ENCOUNTER — Encounter: Payer: Self-pay | Admitting: Internal Medicine

## 2015-10-12 ENCOUNTER — Other Ambulatory Visit: Payer: Self-pay | Admitting: Family Medicine

## 2015-10-13 ENCOUNTER — Encounter: Payer: Self-pay | Admitting: Emergency Medicine

## 2015-10-13 ENCOUNTER — Other Ambulatory Visit: Payer: Self-pay

## 2015-10-13 ENCOUNTER — Ambulatory Visit: Payer: Medicare Other

## 2015-10-13 ENCOUNTER — Emergency Department: Payer: Medicare Other

## 2015-10-13 ENCOUNTER — Other Ambulatory Visit: Payer: Self-pay | Admitting: Internal Medicine

## 2015-10-13 ENCOUNTER — Emergency Department
Admission: EM | Admit: 2015-10-13 | Discharge: 2015-10-13 | Disposition: A | Discharge status: Home / Self Care | Payer: Medicare Other | Attending: Emergency Medicine | Admitting: Emergency Medicine

## 2015-10-13 DIAGNOSIS — M179 Osteoarthritis of knee, unspecified: Secondary | ICD-10-CM | POA: Insufficient documentation

## 2015-10-13 DIAGNOSIS — Z79899 Other long term (current) drug therapy: Secondary | ICD-10-CM | POA: Insufficient documentation

## 2015-10-13 DIAGNOSIS — E785 Hyperlipidemia, unspecified: Secondary | ICD-10-CM | POA: Insufficient documentation

## 2015-10-13 DIAGNOSIS — Z87891 Personal history of nicotine dependence: Secondary | ICD-10-CM | POA: Diagnosis not present

## 2015-10-13 DIAGNOSIS — F329 Major depressive disorder, single episode, unspecified: Secondary | ICD-10-CM | POA: Diagnosis not present

## 2015-10-13 DIAGNOSIS — R110 Nausea: Secondary | ICD-10-CM | POA: Diagnosis not present

## 2015-10-13 DIAGNOSIS — N189 Chronic kidney disease, unspecified: Secondary | ICD-10-CM | POA: Insufficient documentation

## 2015-10-13 DIAGNOSIS — R51 Headache: Secondary | ICD-10-CM | POA: Insufficient documentation

## 2015-10-13 DIAGNOSIS — M5030 Other cervical disc degeneration, unspecified cervical region: Secondary | ICD-10-CM | POA: Diagnosis not present

## 2015-10-13 DIAGNOSIS — Z7982 Long term (current) use of aspirin: Secondary | ICD-10-CM | POA: Insufficient documentation

## 2015-10-13 DIAGNOSIS — I129 Hypertensive chronic kidney disease with stage 1 through stage 4 chronic kidney disease, or unspecified chronic kidney disease: Secondary | ICD-10-CM | POA: Insufficient documentation

## 2015-10-13 DIAGNOSIS — Z96653 Presence of artificial knee joint, bilateral: Secondary | ICD-10-CM | POA: Diagnosis not present

## 2015-10-13 DIAGNOSIS — R930 Abnormal findings on diagnostic imaging of skull and head, not elsewhere classified: Secondary | ICD-10-CM | POA: Diagnosis not present

## 2015-10-13 DIAGNOSIS — R109 Unspecified abdominal pain: Secondary | ICD-10-CM

## 2015-10-13 DIAGNOSIS — R519 Headache, unspecified: Secondary | ICD-10-CM

## 2015-10-13 DIAGNOSIS — M5116 Intervertebral disc disorders with radiculopathy, lumbar region: Secondary | ICD-10-CM | POA: Diagnosis not present

## 2015-10-13 DIAGNOSIS — J45909 Unspecified asthma, uncomplicated: Secondary | ICD-10-CM | POA: Insufficient documentation

## 2015-10-13 DIAGNOSIS — R1011 Right upper quadrant pain: Secondary | ICD-10-CM | POA: Insufficient documentation

## 2015-10-13 DIAGNOSIS — N390 Urinary tract infection, site not specified: Secondary | ICD-10-CM | POA: Insufficient documentation

## 2015-10-13 DIAGNOSIS — R531 Weakness: Secondary | ICD-10-CM

## 2015-10-13 DIAGNOSIS — J189 Pneumonia, unspecified organism: Secondary | ICD-10-CM | POA: Diagnosis not present

## 2015-10-13 DIAGNOSIS — D509 Iron deficiency anemia, unspecified: Secondary | ICD-10-CM | POA: Diagnosis not present

## 2015-10-13 DIAGNOSIS — J449 Chronic obstructive pulmonary disease, unspecified: Secondary | ICD-10-CM

## 2015-10-13 DIAGNOSIS — E781 Pure hyperglyceridemia: Secondary | ICD-10-CM | POA: Diagnosis not present

## 2015-10-13 DIAGNOSIS — E559 Vitamin D deficiency, unspecified: Secondary | ICD-10-CM | POA: Diagnosis not present

## 2015-10-13 DIAGNOSIS — K7689 Other specified diseases of liver: Secondary | ICD-10-CM | POA: Diagnosis not present

## 2015-10-13 LAB — URINALYSIS COMPLETE WITH MICROSCOPIC (ARMC ONLY)
BILIRUBIN URINE: NEGATIVE
Glucose, UA: NEGATIVE mg/dL
Ketones, ur: NEGATIVE mg/dL
Leukocytes, UA: NEGATIVE
Nitrite: NEGATIVE
PH: 5 (ref 5.0–8.0)
Protein, ur: NEGATIVE mg/dL
Specific Gravity, Urine: 1.017 (ref 1.005–1.030)

## 2015-10-13 LAB — BASIC METABOLIC PANEL
Anion gap: 13 (ref 5–15)
BUN: 15 mg/dL (ref 6–20)
CALCIUM: 8.9 mg/dL (ref 8.9–10.3)
CHLORIDE: 96 mmol/L — AB (ref 101–111)
CO2: 22 mmol/L (ref 22–32)
CREATININE: 1.25 mg/dL — AB (ref 0.44–1.00)
GFR calc non Af Amer: 43 mL/min — ABNORMAL LOW (ref >60)
GFR, EST AFRICAN AMERICAN: 49 mL/min — AB (ref >60)
GLUCOSE: 127 mg/dL — AB (ref 65–99)
Potassium: 3.7 mmol/L (ref 3.5–5.1)
Sodium: 131 mmol/L — ABNORMAL LOW (ref 135–145)

## 2015-10-13 LAB — LIPASE, BLOOD: LIPASE: 19 U/L (ref 11–51)

## 2015-10-13 LAB — TSH: TSH: 2.905 u[IU]/mL (ref 0.350–4.500)

## 2015-10-13 LAB — TROPONIN I: TROPONIN I: 0.03 ng/mL (ref <0.031)

## 2015-10-13 LAB — CBC
HCT: 33.2 % — ABNORMAL LOW (ref 35.0–47.0)
HEMOGLOBIN: 10.7 g/dL — AB (ref 12.0–16.0)
MCH: 23.5 pg — AB (ref 26.0–34.0)
MCHC: 32.3 g/dL (ref 32.0–36.0)
MCV: 72.8 fL — AB (ref 80.0–100.0)
PLATELETS: 506 10*3/uL — AB (ref 150–440)
RBC: 4.56 MIL/uL (ref 3.80–5.20)
RDW: 16.5 % — ABNORMAL HIGH (ref 11.5–14.5)
WBC: 12.4 10*3/uL — ABNORMAL HIGH (ref 3.6–11.0)

## 2015-10-13 MED ORDER — DIATRIZOATE MEGLUMINE & SODIUM 66-10 % PO SOLN
15.0000 mL | Freq: Once | ORAL | Status: AC
  Administered 2015-10-13: 15 mL via ORAL
  Filled 2015-10-13: qty 30

## 2015-10-13 MED ORDER — ACETAMINOPHEN 500 MG PO TABS
ORAL_TABLET | ORAL | Status: AC
  Administered 2015-10-13: 1000 mg
  Filled 2015-10-13: qty 2

## 2015-10-13 MED ORDER — SODIUM CHLORIDE 0.9 % IV BOLUS (SEPSIS)
1000.0000 mL | Freq: Once | INTRAVENOUS | Status: AC
  Administered 2015-10-13: 1000 mL via INTRAVENOUS

## 2015-10-13 MED ORDER — NITROFURANTOIN MONOHYD MACRO 100 MG PO CAPS: 100.0000 mg | ORAL_CAPSULE | Freq: Two times a day (BID) | ORAL | Status: AC

## 2015-10-13 MED ORDER — PROCHLORPERAZINE EDISYLATE 5 MG/ML IJ SOLN
INTRAMUSCULAR | Status: AC
  Administered 2015-10-13: 10 mg
  Filled 2015-10-13: qty 2

## 2015-10-13 MED ORDER — IOPAMIDOL (ISOVUE-300) INJECTION 61%
80.0000 mL | Freq: Once | INTRAVENOUS | Status: AC | PRN
  Administered 2015-10-13: 80 mL via INTRAVENOUS

## 2015-10-13 MED ORDER — NITROFURANTOIN MONOHYD MACRO 100 MG PO CAPS
100.0000 mg | ORAL_CAPSULE | Freq: Once | ORAL | Status: AC
  Administered 2015-10-13: 100 mg via ORAL
  Filled 2015-10-13: qty 1

## 2015-10-13 NOTE — ED Provider Notes (Signed)
[facility] Emergency Department Provider Note   ____________________________________________  Time seen: Approximately 4:00 PM  I have reviewed the triage vital signs and the nursing notes.   HISTORY  Chief Complaint Weakness and Nausea   HPI Patricia Gomez is a 70 y.o. female with a history of panic attacks and recent admission for pneumonia was presenting to the emergency department for progressive weakness and worsening headaches over the past 2 months. She says that after she fell from a ladder this past fall she began having headaches which are intermittent burnout constant since the past 2 months. She describes the headache as a 9 out of 10 pressure to the posterior of her head. Denies any neck pain. She says that she has also been nauseous with intermittent vomiting. Said the last time she vomited was last night. She says that she has been able to drink fluid. She also says she was sent in by her every care doctor, Dr. Dario Guardian because he said "they can figure out what's going on with you" at the emergency department. She says that she was also recently found to have blood in her urine and to be anemic.Denies any fever, shortness of breath or cough. Denies any blood in her urine. Says that she has "body aches all over." History also lists migraines.   Past Medical History  Diagnosis Date  . Hx of migraines   . Anxiety   . Osteopenia   . Neuropathy (HCC)   . GERD (gastroesophageal reflux disease)   . Hyperlipidemia   . Depression   . Hypertension   . Chronic kidney disease   . Anemia   . DJD (degenerative joint disease) of knee   . Asthma   . Shortness of breath dyspnea   . Wheezing   . Incontinence   . Panic attack     Patient Active Problem List   Diagnosis Date Noted  . Pneumonia 09/15/2015  . Anxiety 08/20/2015  . Asthma 05/06/2015  . Anemia   . Hyperlipidemia   . Depression   . Osteopenia   . Essential hypertension   . Chronic  kidney disease   . Neuropathy (HCC)   . DJD (degenerative joint disease) of knee   . Failed back surgical syndrome   . Vaginal atrophy 12/16/2014  . DDD (degenerative disc disease), cervical 10/21/2014  . DDD (degenerative disc disease), lumbar 10/21/2014  . Facet syndrome 10/21/2014  . Degenerative joint disease of sacroiliac joint 10/21/2014  . Bilateral occipital neuralgia 10/21/2014    Past Surgical History  Procedure Laterality Date  . Replacement total knee bilateral Bilateral 2011  . Abdominal hysterectomy    . Eye surgery Bilateral 2013  . Oophorectomy      with tumor removed before hysterectomy  . Tubal ligation    . Joint replacement      bilateral knees  . Breast enhancement surgery    . Intraocular lens insertion Bilateral   . Carpometacarpal (cmc) fusion of thumb Left 05/21/2015    Procedure: CARPOMETACARPAL (CMC) FUSION OF THUMB;  Surgeon: Christena Flake, MD;  Location: [facility] ORS;  Service: Orthopedics;  Laterality: Left;  . Ganglion cyst excision Left 05/21/2015    Procedure: REMOVAL GANGLION OF WRIST;  Surgeon: Christena Flake, MD;  Location: [facility] ORS;  Service: Orthopedics;  Laterality: Left;    Current Outpatient Rx  Name  Route  Sig  Dispense  Refill  . albuterol (PROVENTIL HFA;VENTOLIN HFA) 108 (90 BASE) MCG/ACT inhaler   Inhalation  Inhale 2 puffs into the lungs every 6 (six) hours as needed for wheezing or shortness of breath.   2 Inhaler   6   . albuterol (PROVENTIL HFA;VENTOLIN HFA) 108 (90 Base) MCG/ACT inhaler   Inhalation   Inhale 2 puffs into the lungs every 6 (six) hours as needed for wheezing or shortness of breath.   1 Inhaler   0   . aspirin EC 81 MG tablet   Oral   Take 81 mg by mouth daily.         . benazepril (LOTENSIN) 40 MG tablet   Oral   Take 1 tablet (40 mg total) by mouth daily.   30 tablet   6   . hydrOXYzine (ATARAX/VISTARIL) 25 MG tablet   Oral   Take 25 mg by mouth 3 (three) times daily as needed (for muscle cramps).           Marland Kitchen levofloxacin (LEVAQUIN) 750 MG tablet   Oral   Take 1 tablet (750 mg total) by mouth daily.   5 tablet   0   . omeprazole (PRILOSEC) 20 MG capsule   Oral   Take 20 mg by mouth daily.         . ondansetron (ZOFRAN) 4 MG tablet   Oral   Take 1 tablet (4 mg total) by mouth every 8 (eight) hours as needed for nausea or vomiting.   10 tablet   0     Allergies Penicillins; Flagyl; and Prednisone  Family History  Problem Relation Age of Onset  . Muscular dystrophy Sister   . Heart disease Sister   . Mental illness Brother   . Heart disease Mother   . Mental illness Mother   . Heart attack Father     Social History Social History  Substance Use Topics  . Smoking status: Former Smoker    Quit date: 10/20/2008  . Smokeless tobacco: Never Used  . Alcohol Use: No    Review of Systems Constitutional: No fever/chills Eyes: No visual changes. ENT: No sore throat. Cardiovascular: Denies chest pain. Respiratory: Denies shortness of breath. Gastrointestinal: No abdominal pain. No diarrhea.  No constipation. Genitourinary: Negative for dysuria. Musculoskeletal: Negative for back pain. Skin: Negative for rash. Neurological: Negative for  focal weakness or numbness.  10-point ROS otherwise negative.  ____________________________________________   PHYSICAL EXAM:  VITAL SIGNS: ED Triage Vitals  Enc Vitals Group     BP 10/13/15 1327 144/63 mmHg     Pulse Rate 10/13/15 1327 96     Resp 10/13/15 1327 16     Temp 10/13/15 1327 97.5 F (36.4 C)     Temp Source 10/13/15 1327 Oral     SpO2 10/13/15 1327 96 %     Weight 10/13/15 1327 189 lb (85.73 kg)     Height 10/13/15 1327 5\' 1"  (1.549 m)     Head Cir --      Peak Flow --      Pain Score 10/13/15 1432 6     Pain Loc --      Pain Edu? --      Excl. in GC? --     Constitutional: Alert and oriented. Well appearing and in no acute distress. Eyes: Conjunctivae are normal. PERRL. EOMI. Head:  Atraumatic. Nose: No congestion/rhinnorhea. Mouth/Throat: Mucous membranes are moist.  Oropharynx non-erythematous. Neck: No stridor.   Cardiovascular: Normal rate, regular rhythm. Grossly normal heart sounds.  Good peripheral circulation. Respiratory: Normal respiratory effort.  No retractions. Lungs  CTAB. Gastrointestinal: Soft Right upper as well as right lower quadrant tenderness palpation which is worse to the right upper quadrant. Upon asking the patient says the pain does worsen with eating. No distention. No CVA tenderness. Musculoskeletal: No lower extremity tenderness nor edema.  No joint effusions. Neurologic:  Normal speech and language. No gross focal neurologic deficits are appreciated.  Skin:  Skin is warm, dry and intact. No rash noted. Psychiatric: Mood and affect are normal. Speech and behavior are normal.  ____________________________________________   LABS (all labs ordered are listed, but only abnormal results are displayed)  Labs Reviewed  BASIC METABOLIC PANEL - Abnormal; Notable for the following:    Sodium 131 (*)    Chloride 96 (*)    Glucose, Bld 127 (*)    Creatinine, Ser 1.25 (*)    GFR calc non Af Amer 43 (*)    GFR calc Af Amer 49 (*)    All other components within normal limits  CBC - Abnormal; Notable for the following:    WBC 12.4 (*)    Hemoglobin 10.7 (*)    HCT 33.2 (*)    MCV 72.8 (*)    MCH 23.5 (*)    RDW 16.5 (*)    Platelets 506 (*)    All other components within normal limits  URINALYSIS COMPLETEWITH MICROSCOPIC ([facility] ONLY) - Abnormal; Notable for the following:    Color, Urine YELLOW (*)    APPearance CLOUDY (*)    Hgb urine dipstick 1+ (*)    Bacteria, UA RARE (*)    Squamous Epithelial / LPF 6-30 (*)    All other components within normal limits  URINE CULTURE  TROPONIN I  LIPASE, BLOOD  TSH   ____________________________________________  EKG  ED ECG REPORT I, Arelia Longest, the attending physician, personally  viewed and interpreted this ECG.   Date: 10/13/2015  EKG Time: 1716  Rate: 85  Rhythm: normal sinus rhythm  Axis: Normal axis  Intervals: Short PR interval  ST&T Change: No ST segment elevation or depression. Single T-wave inversion in V2. No significant change from the EKG on 09/26/2015.  ____________________________________________  RADIOLOGY  DG Chest 2 View (Final result) Result time: 10/13/15 18:22:04   Final result by Rad Results In Interface (10/13/15 18:22:04)   Narrative:   CLINICAL DATA: Pt reports multiple complaints, reports weakness and all over body aches, recent admission to hospital for pneumonia, Reports nausea and headache x 2 months.  EXAM: CHEST 2 VIEW  COMPARISON: 09/26/2015  FINDINGS: The heart size and mediastinal contours are within normal limits. Both lungs are clear. The visualized skeletal structures are unremarkable.  IMPRESSION: No active cardiopulmonary disease.   Electronically Signed By: Norva Pavlov M.D. On: 10/13/2015 18:22          CT Head Wo Contrast (Final result) Result time: 10/13/15 17:13:47   Final result by Rad Results In Interface (10/13/15 17:13:47)   Narrative:   CLINICAL DATA: Status post fall in November. Patient feels weekend nauseated.  EXAM: CT HEAD WITHOUT CONTRAST  TECHNIQUE: Contiguous axial images were obtained from the base of the skull through the vertex without intravenous contrast.  COMPARISON: None.  FINDINGS: There is chronic diffuse atrophy. There is no midline shift, hydrocephalus, or mass. No acute hemorrhage or acute transcortical infarct is identified. The bony calvarium is intact. The visualized sinuses are clear.  IMPRESSION: No focal acute intracranial abnormality identified. Chronic diffuse atrophy.   Electronically Signed By: Sherian Rein M.D. On: 10/13/2015 17:13  CT Abdomen Pelvis W Contrast (Final result) Result time: 10/13/15 17:31:38    Final result by Rad Results In Interface (10/13/15 17:31:38)   Narrative:   CLINICAL DATA: Weak, nauseated.  EXAM: CT ABDOMEN AND PELVIS WITH CONTRAST  TECHNIQUE: Multidetector CT imaging of the abdomen and pelvis was performed using the standard protocol following bolus administration of intravenous contrast.  CONTRAST: 80mL ISOVUE-300 IOPAMIDOL (ISOVUE-300) INJECTION 61%  COMPARISON: None.  FINDINGS: Lower chest: There is minimal atelectasis of the left lung base.  Hepatobiliary: There are calcified granulomas in the liver. There is a 1 cm low density in the right lobe liver. This is none specific, but can represent a cyst. The gallbladder is normal.  Pancreas: No mass, inflammatory changes, or other significant abnormality.  Spleen: Within normal limits in size and appearance.  Adrenals/Urinary Tract: The adrenal glands are normal. There are bilateral small cysts in the kidneys. No evidence of hydronephrosis.  Stomach/Bowel: No evidence of obstruction, inflammatory process, or abnormal fluid collections.There is no diverticulitis. The appendix is not seen but no inflammation is noted around the cecum.  Vascular/Lymphatic: No pathologically enlarged lymph nodes. No evidence of abdominal aortic aneurysm.There is atherosclerosis of the aorta.  Reproductive: There is hysterectomy. No mass or other significant abnormality.  Other: There is minimal umbilical hernia of mesenteric fat.  Musculoskeletal: There are degenerative changes of the spine.  IMPRESSION: No acute abnormality identified in the abdomen and pelvis.  Calcified granulomas in the liver.  Prior hysterectomy.   Electronically Signed By: Sherian ReinWei-Chen Lin M.D. On: 10/13/2015 17:31       ____________________________________________   PROCEDURES   ____________________________________________   INITIAL IMPRESSION / ASSESSMENT AND PLAN / ED COURSE  Pertinent labs & imaging  results that were available during my care of the patient were reviewed by me and considered in my medical decision making (see chart for details).  ----------------------------------------- 8:11 PM on 10/13/2015 -----------------------------------------  Patient with reassuring workup. Possible mild UTI. Has follow-up with neurology later this month. Recommend follow-up with primary care. We'll give antibiotics for the UTI. Says she feels slightly improved after fluids. She seemed to be chronic without any emergent findings on workup during this visit. Plan is a patient. She understands her return precautions. Will follow-up in the office. ____________________________________________   FINAL CLINICAL IMPRESSION(S) / ED DIAGNOSES  Abdominal pain. Weakness. Nausea. Headache. UTI.    NEW MEDICATIONS STARTED DURING THIS VISIT:  New Prescriptions   No medications on file     Note:  This document was prepared using Dragon voice recognition software and may include unintentional dictation errors.    Myrna Blazeravid Matthew Kida Digiulio, MD 10/13/15 2012

## 2015-10-13 NOTE — ED Notes (Signed)
Pt reports multiple complaints, reports weakness and all over body aches, recent admission to hospital for pneumonia, Reports nausea and headache x 2 months.

## 2015-10-13 NOTE — ED Notes (Signed)
Pt reports nausea and headache at this time and request medication for both - will inform MD of request - pt appears in no acute distress and ambulated with one assist to the bathroom without difficulty - respirations even and unlabored

## 2015-10-13 NOTE — ED Notes (Signed)
Lab notified of TSH, troponin, and lipase add on

## 2015-10-13 NOTE — ED Notes (Signed)
Patient transported to CT 

## 2015-10-13 NOTE — ED Notes (Signed)
Says she is weak and nauseated.  Went to dr Dario Guardianjadali and he sent her here for admission.  Pt had recent admit for pneumonia and sepsis.

## 2015-10-13 NOTE — ED Notes (Signed)
CT notified that pt has finished drinking contrast for study

## 2015-10-13 NOTE — ED Notes (Signed)
Pt resting at this time.  Pt in NAD.  Pt states she would like to see what the doctor is planning on doing.  Will notify EDP.

## 2015-10-13 NOTE — Discharge Instructions (Signed)

## 2015-10-15 ENCOUNTER — Ambulatory Visit: Payer: Medicare Other

## 2015-10-15 LAB — URINE CULTURE

## 2015-10-20 ENCOUNTER — Ambulatory Visit: Payer: Medicare Other | Attending: Internal Medicine

## 2015-10-20 DIAGNOSIS — J449 Chronic obstructive pulmonary disease, unspecified: Secondary | ICD-10-CM | POA: Diagnosis not present

## 2015-10-20 MED ORDER — ALBUTEROL SULFATE (2.5 MG/3ML) 0.083% IN NEBU
2.5000 mg | INHALATION_SOLUTION | Freq: Once | RESPIRATORY_TRACT | Status: AC
  Administered 2015-10-20: 2.5 mg via RESPIRATORY_TRACT
  Filled 2015-10-20: qty 3

## 2015-10-26 DIAGNOSIS — M1812 Unilateral primary osteoarthritis of first carpometacarpal joint, left hand: Secondary | ICD-10-CM | POA: Diagnosis not present

## 2015-10-28 DIAGNOSIS — N390 Urinary tract infection, site not specified: Secondary | ICD-10-CM | POA: Diagnosis not present

## 2015-10-28 DIAGNOSIS — R110 Nausea: Secondary | ICD-10-CM | POA: Diagnosis not present

## 2015-10-29 ENCOUNTER — Encounter: Payer: Self-pay | Admitting: Emergency Medicine

## 2015-10-29 ENCOUNTER — Emergency Department
Admission: EM | Admit: 2015-10-29 | Discharge: 2015-10-29 | Disposition: A | Discharge status: Home / Self Care | Payer: Medicare Other | Attending: Emergency Medicine | Admitting: Emergency Medicine

## 2015-10-29 ENCOUNTER — Encounter: Payer: Self-pay | Admitting: *Deleted

## 2015-10-29 ENCOUNTER — Emergency Department: Payer: Medicare Other

## 2015-10-29 ENCOUNTER — Encounter: Payer: Medicare Other | Admitting: Internal Medicine

## 2015-10-29 DIAGNOSIS — M179 Osteoarthritis of knee, unspecified: Secondary | ICD-10-CM | POA: Diagnosis not present

## 2015-10-29 DIAGNOSIS — M5136 Other intervertebral disc degeneration, lumbar region: Secondary | ICD-10-CM | POA: Diagnosis not present

## 2015-10-29 DIAGNOSIS — F329 Major depressive disorder, single episode, unspecified: Secondary | ICD-10-CM | POA: Insufficient documentation

## 2015-10-29 DIAGNOSIS — N189 Chronic kidney disease, unspecified: Secondary | ICD-10-CM | POA: Diagnosis not present

## 2015-10-29 DIAGNOSIS — E785 Hyperlipidemia, unspecified: Secondary | ICD-10-CM | POA: Diagnosis not present

## 2015-10-29 DIAGNOSIS — Z791 Long term (current) use of non-steroidal anti-inflammatories (NSAID): Secondary | ICD-10-CM | POA: Insufficient documentation

## 2015-10-29 DIAGNOSIS — R531 Weakness: Secondary | ICD-10-CM

## 2015-10-29 DIAGNOSIS — Z7982 Long term (current) use of aspirin: Secondary | ICD-10-CM | POA: Diagnosis not present

## 2015-10-29 DIAGNOSIS — Z87891 Personal history of nicotine dependence: Secondary | ICD-10-CM | POA: Diagnosis not present

## 2015-10-29 DIAGNOSIS — Z96653 Presence of artificial knee joint, bilateral: Secondary | ICD-10-CM | POA: Insufficient documentation

## 2015-10-29 DIAGNOSIS — I129 Hypertensive chronic kidney disease with stage 1 through stage 4 chronic kidney disease, or unspecified chronic kidney disease: Secondary | ICD-10-CM | POA: Diagnosis not present

## 2015-10-29 DIAGNOSIS — Z79899 Other long term (current) drug therapy: Secondary | ICD-10-CM | POA: Diagnosis not present

## 2015-10-29 DIAGNOSIS — J45909 Unspecified asthma, uncomplicated: Secondary | ICD-10-CM | POA: Diagnosis not present

## 2015-10-29 DIAGNOSIS — M5030 Other cervical disc degeneration, unspecified cervical region: Secondary | ICD-10-CM | POA: Insufficient documentation

## 2015-10-29 DIAGNOSIS — R519 Headache, unspecified: Secondary | ICD-10-CM

## 2015-10-29 DIAGNOSIS — R51 Headache: Secondary | ICD-10-CM | POA: Diagnosis not present

## 2015-10-29 LAB — CBC
HEMATOCRIT: 30.8 % — AB (ref 35.0–47.0)
HEMOGLOBIN: 10.2 g/dL — AB (ref 12.0–16.0)
MCH: 24.9 pg — ABNORMAL LOW (ref 26.0–34.0)
MCHC: 33.1 g/dL (ref 32.0–36.0)
MCV: 75.3 fL — AB (ref 80.0–100.0)
PLATELETS: 317 10*3/uL (ref 150–440)
RBC: 4.09 MIL/uL (ref 3.80–5.20)
RDW: 19.2 % — AB (ref 11.5–14.5)
WBC: 12.3 10*3/uL — AB (ref 3.6–11.0)

## 2015-10-29 LAB — URINALYSIS COMPLETE WITH MICROSCOPIC (ARMC ONLY)
Bacteria, UA: NONE SEEN
Bilirubin Urine: NEGATIVE
GLUCOSE, UA: NEGATIVE mg/dL
Ketones, ur: NEGATIVE mg/dL
Leukocytes, UA: NEGATIVE
Nitrite: NEGATIVE
PROTEIN: NEGATIVE mg/dL
SPECIFIC GRAVITY, URINE: 1.003 — AB (ref 1.005–1.030)
WBC UA: NONE SEEN WBC/hpf (ref 0–5)
pH: 5 (ref 5.0–8.0)

## 2015-10-29 LAB — COMPREHENSIVE METABOLIC PANEL
ALBUMIN: 3.3 g/dL — AB (ref 3.5–5.0)
ALT: 13 U/L — ABNORMAL LOW (ref 14–54)
AST: 18 U/L (ref 15–41)
Alkaline Phosphatase: 160 U/L — ABNORMAL HIGH (ref 38–126)
Anion gap: 8 (ref 5–15)
BUN: 12 mg/dL (ref 6–20)
CHLORIDE: 102 mmol/L (ref 101–111)
CO2: 26 mmol/L (ref 22–32)
Calcium: 8.7 mg/dL — ABNORMAL LOW (ref 8.9–10.3)
Creatinine, Ser: 0.93 mg/dL (ref 0.44–1.00)
GFR calc Af Amer: >60 mL/min (ref >60)
GLUCOSE: 120 mg/dL — AB (ref 65–99)
Potassium: 3.7 mmol/L (ref 3.5–5.1)
Sodium: 136 mmol/L (ref 135–145)
Total Bilirubin: 0.5 mg/dL (ref 0.3–1.2)
Total Protein: 7 g/dL (ref 6.5–8.1)

## 2015-10-29 LAB — LIPASE, BLOOD: LIPASE: 20 U/L (ref 11–51)

## 2015-10-29 MED ORDER — ONDANSETRON 4 MG PO TBDP
4.0000 mg | ORAL_TABLET | Freq: Once | ORAL | Status: AC
  Administered 2015-10-29: 4 mg via ORAL
  Filled 2015-10-29: qty 1

## 2015-10-29 MED ORDER — DIAZEPAM 5 MG/ML IJ SOLN
2.5000 mg | Freq: Once | INTRAMUSCULAR | Status: AC
  Administered 2015-10-29: 2.5 mg via INTRAVENOUS
  Filled 2015-10-29: qty 2

## 2015-10-29 MED ORDER — ACETAMINOPHEN 325 MG PO TABS
650.0000 mg | ORAL_TABLET | Freq: Once | ORAL | Status: AC
  Administered 2015-10-29: 650 mg via ORAL
  Filled 2015-10-29: qty 2

## 2015-10-29 MED ORDER — GADOBENATE DIMEGLUMINE 529 MG/ML IV SOLN
20.0000 mL | Freq: Once | INTRAVENOUS | Status: AC | PRN
  Administered 2015-10-29: 17 mL via INTRAVENOUS

## 2015-10-29 MED ORDER — ONDANSETRON HCL 4 MG/2ML IJ SOLN
4.0000 mg | Freq: Once | INTRAMUSCULAR | Status: AC | PRN
  Administered 2015-10-29: 4 mg via INTRAVENOUS
  Filled 2015-10-29: qty 2

## 2015-10-29 MED ORDER — MORPHINE SULFATE (PF) 2 MG/ML IV SOLN
2.0000 mg | Freq: Once | INTRAVENOUS | Status: AC
  Administered 2015-10-29: 2 mg via INTRAVENOUS
  Filled 2015-10-29: qty 1

## 2015-10-29 NOTE — Care Management Note (Signed)
Case Management Note  Patient Details  Name: Patricia Gomez MRN: [id number] Date of Birth: 12/02/1945  Subjective/Objective:   Spoke to patient after overhearing that she has requested placement.All labs and tests are fine. Patient is stating to me that she has had breathing issues, continuing to wheeze after being here in April and treated for pneumonia.She also states   She needs help going to her appts. I have detailed to her that she would have to hire someone out of pocket for that, but we could set her up with PT and a CSW in the home to try to work on her mobility. She has agreed to this, and says Patricia. Dario Gomez is her primary doctor. She does also have pain in her legs that is chronic. Contacted [facility] at the patient request after showing her a list to choose from. Left a VM for Patricia Gomez at [phone].    Patricia Gomez has agreed to complete the order for home PT, and CSW, and complete the face to face documentation.            Action/Plan:   Expected Discharge Date:                  Expected Discharge Plan:     In-House Referral:     Discharge planning Services     Post Acute Care Choice:    Choice offered to:     DME Arranged:    DME Agency:     HH Arranged:    HH Agency:     Status of Service:     Medicare Important Message Given:    Date Medicare IM Given:    Medicare IM give by:    Date Additional Medicare IM Given:    Additional Medicare Important Message give by:     If discussed at Long Length of Stay Meetings, dates discussed:    Additional Comments:  Patricia BueCheryl West [place], RN 10/29/2015, 12:53 PM

## 2015-10-29 NOTE — ED Notes (Signed)
Pt back from MRI 

## 2015-10-29 NOTE — ED Notes (Signed)
Pt on phone talking with neighbor stating "they put me in the lobby!"; neighbor requesting to speak with nurse and questions what pt is waiting on and that pt st that she is going to be sent back home; explained to neighbor that pt is waiting for an exam room to be available so she can be evaluated; explained that pt has not been seen yet by a provider

## 2015-10-29 NOTE — ED Notes (Signed)
Pt given water, resting in bed in no distress

## 2015-10-29 NOTE — ED Notes (Signed)
Patient transported to MRI 

## 2015-10-29 NOTE — Discharge Instructions (Signed)
General Headache Without Cause A headache is pain or discomfort felt around the head or neck area. There are many causes and types of headaches. In some cases, the cause may not be found.  HOME CARE  Managing Pain  Take over-the-counter and prescription medicines only as told by your doctor.  Lie down in a dark, quiet room when you have a headache.  If directed, apply ice to the head and neck area:  Put ice in a plastic bag.  Place a towel between your skin and the bag.  Leave the ice on for 20 minutes, 2-3 times per day.  Use a heating pad or hot shower to apply heat to the head and neck area as told by your doctor.  Keep lights dim if bright lights bother you or make your headaches worse. Eating and Drinking  Eat meals on a regular schedule.  Lessen how much alcohol you drink.  Lessen how much caffeine you drink, or stop drinking caffeine. General Instructions  Keep all follow-up visits as told by your doctor. This is important.  Keep a journal to find out if certain things bring on headaches. For example, write down:  What you eat and drink.  How much sleep you get.  Any change to your diet or medicines.  Relax by getting a massage or doing other relaxing activities.  Lessen stress.  Sit up straight. Do not tighten (tense) your muscles.  Do not use tobacco products. This includes cigarettes, chewing tobacco, or e-cigarettes. If you need help quitting, ask your doctor.  Exercise regularly as told by your doctor.  Get enough sleep. This often means 7-9 hours of sleep. GET HELP IF:  Your symptoms are not helped by medicine.  You have a headache that feels different than the other headaches.  You feel sick to your stomach (nauseous) or you throw up (vomit).  You have a fever. GET HELP RIGHT AWAY IF:   Your headache becomes really bad.  You keep throwing up.  You have a stiff neck.  You have trouble seeing.  You have trouble speaking.  You have  pain in the eye or ear.  Your muscles are weak or you lose muscle control.  You lose your balance or have trouble walking.  You feel like you will pass out (faint) or you pass out.  You have confusion.   This information is not intended to replace advice given to you by your health care provider. Make sure you discuss any questions you have with your health care provider.   Document Released: 03/08/2008 Document Revised: 02/18/2015 Document Reviewed: 09/22/2014 Elsevier Interactive Patient Education 2016 Elsevier Inc.  Weakness Weakness is a lack of strength. You may feel weak all over your body or just in one part of your body. Weakness can be serious. In some cases, you may need more medical tests. HOME CARE  Rest.  Eat a well-balanced diet.  Try to exercise every day.  Only take medicines as told by your doctor. GET HELP RIGHT AWAY IF:   You cannot do your normal daily activities.  You cannot walk up and down stairs, or you feel very tired when you do so.  You have shortness of breath or chest pain.  You have trouble moving parts of your body.  You have weakness in only one body part or on only one side of the body.  You have a fever.  You have trouble speaking or swallowing.  You cannot control when you pee (  urinate) or poop (bowel movement).  You have black or bloody throw up (vomit) or poop.  Your weakness gets worse or spreads to other body parts.  You have new aches or pains. MAKE SURE YOU:   Understand these instructions.  Will watch your condition.  Will get help right away if you are not doing well or get worse.   This information is not intended to replace advice given to you by your health care provider. Make sure you discuss any questions you have with your health care provider.   Document Released: 05/12/2008 Document Revised: 11/29/2011 Document Reviewed: 07/29/2011 Elsevier Interactive Patient Education [employer] Elsevier Inc.

## 2015-10-29 NOTE — ED Provider Notes (Signed)
-----------------------------------------   1:06 PM on 10/29/2015 -----------------------------------------  I have seen and evaluated the patient. Her medical workup is largely within normal limits. I filled out a face-to-face form for social work as well as physical therapy evaluation at home. Patient is agreeable to this plan. Patient will be discharged home with primary care follow-up. I also discussed with the patient follow-up with neurology given her chronic headaches. Patient is agreeable.  Minna AntisKevin Gordie Belvin, MD 10/29/15 [phone]

## 2015-10-29 NOTE — ED Notes (Signed)
[facility] st [facility]; pt informed that EMS will not come pick her up; pt cont to st "call them that brought me!"; pt again informed that EMS will not come pick her up to take her to another hospital

## 2015-10-29 NOTE — ED Notes (Signed)
Pt states "I have a headache, Im shaking, I have a fever, I need nausea medicine", MD notified

## 2015-10-29 NOTE — ED Notes (Addendum)
Pt to triage via w/c brought in by EMS; pt reports generalized HA since 12midnight with no accomp symptoms; pt seen for same here recently and CT scan WNL; tramadol taken at 1230 without relief; pt making sobbing sounds; pt st has appt to see Dr Malvin JohnsPotter, neurology in June; pt st "I'm not going home, I got no where to go and no one to take care of me!"

## 2015-10-29 NOTE — ED Provider Notes (Signed)
[place] [facility]  ____________________________________________  Time seen:   I have reviewed the triage vital signs and the nursing notes.   HISTORY  Chief Complaint Headache   HPI Patricia Gomez is a 70 y.o. female with below listed past medical history presents to the emergency department with multiple complaints. Patient states that she wants to go to a nursing home as she is no longer able to care for herself at home. In addition patient admits to chronic headaches of which is experiencing one at this time patient states her current pain score is 10 out of 10 and generalized. Patient denies any weakness no numbness.   Past Medical History  Diagnosis Date  . Hx of migraines   . Anxiety   . Osteopenia   . Neuropathy (HCC)   . GERD (gastroesophageal reflux disease)   . Hyperlipidemia   . Depression   . Hypertension   . Chronic kidney disease   . Anemia   . DJD (degenerative joint disease) of knee   . Asthma   . Shortness of breath dyspnea   . Wheezing   . Incontinence   . Panic attack     Patient Active Problem List   Diagnosis Date Noted  . Pneumonia 09/15/2015  . Anxiety 08/20/2015  . Asthma 05/06/2015  . Anemia   . Hyperlipidemia   . Depression   . Osteopenia   . Essential hypertension   . Chronic kidney disease   . Neuropathy (HCC)   . DJD (degenerative joint disease) of knee   . Failed back surgical syndrome   . Vaginal atrophy 12/16/2014  . DDD (degenerative disc disease), cervical 10/21/2014  . DDD (degenerative disc disease), lumbar 10/21/2014  . Facet syndrome 10/21/2014  . Degenerative joint disease of sacroiliac joint 10/21/2014  . Bilateral occipital neuralgia 10/21/2014    Past Surgical History  Procedure Laterality Date  . Replacement total knee bilateral Bilateral 2011  . Abdominal hysterectomy    . Eye surgery Bilateral 2013  . Oophorectomy      with tumor removed before  hysterectomy  . Tubal ligation    . Joint replacement      bilateral knees  . Breast enhancement surgery    . Intraocular lens insertion Bilateral   . Carpometacarpal (cmc) fusion of thumb Left 05/21/2015    Procedure: CARPOMETACARPAL (CMC) FUSION OF THUMB;  Surgeon: Christena Flake, MD;  Location: [facility] ORS;  Service: Orthopedics;  Laterality: Left;  . Ganglion cyst excision Left 05/21/2015    Procedure: REMOVAL GANGLION OF WRIST;  Surgeon: Christena Flake, MD;  Location: [facility] ORS;  Service: Orthopedics;  Laterality: Left;    Current Outpatient Rx  Name  Route  Sig  Dispense  Refill  . albuterol (PROVENTIL HFA;VENTOLIN HFA) 108 (90 BASE) MCG/ACT inhaler   Inhalation   Inhale 2 puffs into the lungs every 6 (six) hours as needed for wheezing or shortness of breath.   2 Inhaler   6   . albuterol (PROVENTIL HFA;VENTOLIN HFA) 108 (90 Base) MCG/ACT inhaler   Inhalation   Inhale 2 puffs into the lungs every 6 (six) hours as needed for wheezing or shortness of breath.   1 Inhaler   0   . aspirin EC 81 MG tablet   Oral   Take 81 mg by mouth daily.         . benazepril (LOTENSIN) 40 MG tablet   Oral   Take 1 tablet (40 mg total)  by mouth daily.   30 tablet   6   . hydrOXYzine (ATARAX/VISTARIL) 25 MG tablet   Oral   Take 25 mg by mouth 3 (three) times daily as needed (for muscle cramps).         Marland Kitchen levofloxacin (LEVAQUIN) 750 MG tablet   Oral   Take 1 tablet (750 mg total) by mouth daily.   5 tablet   0   . omeprazole (PRILOSEC) 20 MG capsule   Oral   Take 20 mg by mouth daily.         . ondansetron (ZOFRAN) 4 MG tablet   Oral   Take 1 tablet (4 mg total) by mouth every 8 (eight) hours as needed for nausea or vomiting.   10 tablet   0     Allergies Penicillins; Flagyl; and Prednisone  Family History  Problem Relation Age of Onset  . Muscular dystrophy Sister   . Heart disease Sister   . Mental illness Brother   . Heart disease Mother   . Mental illness Mother   .  Heart attack Father     Social History Social History  Substance Use Topics  . Smoking status: Former Smoker    Quit date: 10/20/2008  . Smokeless tobacco: Never Used  . Alcohol Use: No    Review of Systems  Constitutional: Negative for fever. Eyes: Negative for visual changes. ENT: Negative for sore throat. Cardiovascular: Negative for chest pain. Respiratory: Negative for shortness of breath. Gastrointestinal: Negative for abdominal pain, vomiting and diarrhea. Genitourinary: Negative for dysuria. Musculoskeletal: Negative for back pain. Skin: Negative for rash. Neurological: Negative for headaches, focal weakness or numbness.   10-point ROS otherwise negative.  ____________________________________________   PHYSICAL EXAM:  VITAL SIGNS: ED Triage Vitals  Enc Vitals Group     BP 10/29/15 0219 133/91 mmHg     Pulse Rate 10/29/15 0219 88     Resp 10/29/15 0219 20     Temp 10/29/15 0219 97.9 F (36.6 C)     Temp Source 10/29/15 0219 Oral     SpO2 10/29/15 0219 97 %     Weight 10/29/15 0219 188 lb (85.276 kg)     Height 10/29/15 0219 5\' 1"  (1.549 m)     Head Cir --      Peak Flow --      Pain Score 10/29/15 0219 10     Pain Loc --      Pain Edu? --      Excl. in GC? --      Constitutional: Alert and oriented. Well appearing and in no distress. Eyes: Conjunctivae are normal. PERRL. Normal extraocular movements. ENT   Head: Normocephalic and atraumatic.   Nose: No congestion/rhinnorhea.   Mouth/Throat: Mucous membranes are moist.   Neck: No stridor. Hematological/Lymphatic/Immunilogical: No cervical lymphadenopathy. Cardiovascular: Normal rate, regular rhythm. Normal and symmetric distal pulses are present in all extremities. No murmurs, rubs, or gallops. Respiratory: Normal respiratory effort without tachypnea nor retractions. Breath sounds are clear and equal bilaterally. No wheezes/rales/rhonchi. Gastrointestinal: Soft and nontender. No  distention. There is no CVA tenderness. Genitourinary: deferred Musculoskeletal: Nontender with normal range of motion in all extremities. No joint effusions.  No lower extremity tenderness nor edema. Neurologic:  Normal speech and language. No gross focal neurologic deficits are appreciated. Speech is normal.  Skin:  Skin is warm, dry and intact. No rash noted. Psychiatric: Mood and affect are normal. Speech and behavior are normal. Patient exhibits appropriate insight and judgment.  ____________________________________________    LABS (pertinent positives/negatives)  Labs Reviewed  COMPREHENSIVE METABOLIC PANEL - Abnormal; Notable for the following:    Glucose, Bld 120 (*)    Calcium 8.7 (*)    Albumin 3.3 (*)    ALT 13 (*)    Alkaline Phosphatase 160 (*)    All other components within normal limits  CBC - Abnormal; Notable for the following:    WBC 12.3 (*)    Hemoglobin 10.2 (*)    HCT 30.8 (*)    MCV 75.3 (*)    MCH 24.9 (*)    RDW 19.2 (*)    All other components within normal limits  URINALYSIS COMPLETEWITH MICROSCOPIC ([facility] ONLY) - Abnormal; Notable for the following:    Color, Urine STRAW (*)    APPearance CLEAR (*)    Specific Gravity, Urine 1.003 (*)    Hgb urine dipstick 2+ (*)    Squamous Epithelial / LPF 0-5 (*)    All other components within normal limits  LIPASE, BLOOD    ____________________________________________    EKG  ED ECG REPORT I,  N Zanni, the attending physician, personally viewed and interpreted this ECG.   Date: 10/29/2015  EKG Time: 3:54 AM  Rate: 58  Rhythm: Normal sinus rhythm  Axis: Normal  Intervals: Normal  ST&T Change:    ____________________________________________      INITIAL IMPRESSION / ASSESSMENT AND PLAN / ED COURSE  Pertinent labs & imaging results that were available during my care of the patient were reviewed by me and considered in my medical decision making (see chart for details).  Patient's  care transferred to Dr. Langston MaskerShaevitz  ____________________________________________   FINAL CLINICAL IMPRESSION(S) / ED DIAGNOSES  Final diagnoses:  Generalized weakness      Darci Currentandolph N Nicolaisen, MD 10/30/15 [phone]

## 2015-10-29 NOTE — Care Management Note (Signed)
Case Management Note  Patient Details  Name: Trey PaulaRebecca A Oyen MRN: [id number] Date of Birth: 12/17/1945  Subjective/Objective:  Patient has contacted her ride home. Given followup numbers for [facility]. Pt. Also instructed to talk to her Medicaid worker to ask about any other resources they can help with. She states she is aware They  Help with  [employer] . Confirmed services set up with Barbara CowerJason on the phone.Pt. Awaiting ride to go home.         Action/Plan:   Expected Discharge Date:                  Expected Discharge Plan:     In-House Referral:     Discharge planning Services     Post Acute Care Choice:    Choice offered to:     DME Arranged:    DME Agency:     HH Arranged:    HH Agency:     Status of Service:     Medicare Important Message Given:    Date Medicare IM Given:    Medicare IM give by:    Date Additional Medicare IM Given:    Additional Medicare Important Message give by:     If discussed at Long Length of Stay Meetings, dates discussed:    Additional Comments:  Berna BueCheryl Vihana Kydd, RN 10/29/2015, 1:14 PM

## 2015-10-29 NOTE — Progress Notes (Signed)
[facility] Consultation      Assessment and Plan:  Emphysema     Date: 10/29/2015  MRN# [id number] Patricia Gomez Sep 25, 1945  Referring Physician:   JASMEEN Gomez is a 70 y.o. old female seen in consultation for chief complaint of:   No chief complaint on file.   HPI:   The patient is a 70 year old female recently seen in the hospital approximately one month ago for left lower lobe pneumonia. She was treated with Levaquin for community-acquired pneumonia, she went back to the ER 2 weeks ago with diffuse body aches and fatigue. CT of the head showed chronic diffuse atrophy, CT of the abdomen and pelvis showed granulomas in the liver. Otherwise, no acute changes.  Review of chest x-ray images from 10/13/15 shows bibasilar atelectasis and changes of chronic bronchitis, this is minimally changed from previous image 4/15. Review of CT abdomen and pelvis from 5/2-lower lung cuts showed some emphysematous changes, otherwise, unremarkable. Lungs. Review of only function tests performed on 10/20/2015: Spirometry: FVC was 2.53 L, which was 102% of predicted, FEV1 was 1.69 L, which is 97% of predicted. FEV to FVC ratio was 67%. There was no significant improvement with bronchodilator therapy. Lung volumes: Total lung capacity is 106% predicted, vital capacity was 102% predicted. RV to TLC ratio was 40%. DLCO: Diffusion capacity is 52% of predicted. Interpretation: Overall this tests consistent with mild shortness of lung disease, with significantly reduced. Diffusion capacity   PMHX:   Past Medical History  Diagnosis Date  . Hx of migraines   . Anxiety   . Osteopenia   . Neuropathy (HCC)   . GERD (gastroesophageal reflux disease)   . Hyperlipidemia   . Depression   . Hypertension   . Chronic kidney disease   . Anemia   . DJD (degenerative joint disease) of knee   . Asthma   . Shortness of breath dyspnea   . Wheezing   . Incontinence   . Panic attack     Surgical Hx:  Past Surgical History  Procedure Laterality Date  . Replacement total knee bilateral Bilateral 2011  . Abdominal hysterectomy    . Eye surgery Bilateral 2013  . Oophorectomy      with tumor removed before hysterectomy  . Tubal ligation    . Joint replacement      bilateral knees  . Breast enhancement surgery    . Intraocular lens insertion Bilateral   . Carpometacarpal (cmc) fusion of thumb Left 05/21/2015    Procedure: CARPOMETACARPAL (CMC) FUSION OF THUMB;  Surgeon: Christena Flake, MD;  Location: [facility] ORS;  Service: Orthopedics;  Laterality: Left;  . Ganglion cyst excision Left 05/21/2015    Procedure: REMOVAL GANGLION OF WRIST;  Surgeon: Christena Flake, MD;  Location: [facility] ORS;  Service: Orthopedics;  Laterality: Left;   Family Hx:  Family History  Problem Relation Age of Onset  . Muscular dystrophy Sister   . Heart disease Sister   . Mental illness Brother   . Heart disease Mother   . Mental illness Mother   . Heart attack Father    Social Hx:   Social History  Substance Use Topics  . Smoking status: Former Smoker    Quit date: 10/20/2008  . Smokeless tobacco: Never Used  . Alcohol Use: No   Medication:   Current Outpatient Rx  Name  Route  Sig  Dispense  Refill  . albuterol (PROVENTIL HFA;VENTOLIN HFA) 108 (90 BASE) MCG/ACT inhaler  Inhalation   Inhale 2 puffs into the lungs every 6 (six) hours as needed for wheezing or shortness of breath.   2 Inhaler   6   . albuterol (PROVENTIL HFA;VENTOLIN HFA) 108 (90 Base) MCG/ACT inhaler   Inhalation   Inhale 2 puffs into the lungs every 6 (six) hours as needed for wheezing or shortness of breath.   1 Inhaler   0   . aspirin EC 81 MG tablet   Oral   Take 81 mg by mouth daily.         . benazepril (LOTENSIN) 40 MG tablet   Oral   Take 1 tablet (40 mg total) by mouth daily.   30 tablet   6   . hydrOXYzine (ATARAX/VISTARIL) 25 MG tablet   Oral   Take 25 mg by mouth 3 (three) times daily as needed  (for muscle cramps).         Marland Kitchen. levofloxacin (LEVAQUIN) 750 MG tablet   Oral   Take 1 tablet (750 mg total) by mouth daily.   5 tablet   0   . omeprazole (PRILOSEC) 20 MG capsule   Oral   Take 20 mg by mouth daily.         . ondansetron (ZOFRAN) 4 MG tablet   Oral   Take 1 tablet (4 mg total) by mouth every 8 (eight) hours as needed for nausea or vomiting.   10 tablet   0       Allergies:  Penicillins; Flagyl; and Prednisone  Review of Systems: Gen:  Denies  fever, sweats, chills HEENT: Denies blurred vision, double vision. bleeds, sore throat Cvc:  No dizziness, chest pain. Resp:   Denies cough or sputum production, shortness of breath Gi: Denies swallowing difficulty, stomach pain. Gu:  Denies bladder incontinence, burning urine Ext:   No Joint pain, stiffness. Skin: No skin rash,  hives  Endoc:  No polyuria, polydipsia. Psych: No depression, insomnia. Other:  All other systems were reviewed with the patient and were negative other that what is mentioned in the HPI.   Physical Examination:   VS: There were no vitals taken for this visit.  General Appearance: No distress  Neuro:without focal findings,  speech normal,  HEENT: PERRLA, EOM intact.   Pulmonary: normal breath sounds, No wheezing.  CardiovascularNormal S1,S2.  No m/r/g.   Abdomen: Benign, Soft, non-tender. Renal:  No costovertebral tenderness  GU:  No performed at this time. Endoc: No evident thyromegaly, no signs of acromegaly. Skin:   warm, no rashes, no ecchymosis  Extremities: normal, no cyanosis, clubbing.  Other findings:    LABORATORY PANEL:   CBC  Recent Labs Lab 10/29/15 0340  WBC 12.3*  HGB 10.2*  HCT 30.8*  PLT 317   ------------------------------------------------------------------------------------------------------------------  Chemistries   Recent Labs Lab 10/29/15 0340  NA 136  K 3.7  CL 102  CO2 26  GLUCOSE 120*  BUN 12  CREATININE 0.93  CALCIUM 8.7*    AST 18  ALT 13*  ALKPHOS 160*  BILITOT 0.5   ------------------------------------------------------------------------------------------------------------------  Cardiac Enzymes No results for input(s): TROPONINI in the last 168 hours. ------------------------------------------------------------  RADIOLOGY:  Mr Lodema PilotBrain W Wo Contrast  10/29/2015  CLINICAL DATA:  Initial evaluation for acute headache. EXAM: MRI HEAD WITHOUT AND WITH CONTRAST TECHNIQUE: Multiplanar, multiecho pulse sequences of the brain and surrounding structures were obtained without and with intravenous contrast. CONTRAST:  17mL MULTIHANCE GADOBENATE DIMEGLUMINE 529 MG/ML IV SOLN COMPARISON:  Prior CT from 10/13/2015. FINDINGS:  Diffuse prominence of the CSF containing spaces is compatible with generalized age-related cerebral atrophy. Patchy T2/FLAIR hyperintensity within the periventricular and deep white matter both cerebral hemispheres most consistent with chronic small vessel ischemic disease. These changes are mild to moderate in nature. No abnormal foci of restricted diffusion to suggest acute intracranial infarct. Gray-white matter differentiation maintained. Major intracranial vascular flow voids are preserved. No acute or chronic intracranial hemorrhage. No definite areas of chronic infarction. No mass lesion, midline shift, or mass effect. No hydrocephalus. No extra-axial fluid collection. Major dural sinuses are grossly patent. No abnormal enhancement. Craniocervical junction within normal limits. Visualized upper cervical spine unremarkable. Pituitary gland normal. No acute abnormality about the orbits. Patient is status post bilateral cataract extraction. Paranasal sinuses are clear. No mastoid effusion. Inner ear structures grossly normal. Bone marrow signal intensity within normal limits. No scalp soft tissue abnormality. IMPRESSION: 1. No acute intracranial process. 2. 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 Electronically Signed   By: Rise Mu M.D.   On: 10/29/2015 07:14       Thank  you for the consultation and for allowing [facility] to assist in the care of your patient. Our recommendations are noted above.  Please contact us if we can be of further service.   Wells Guiles, MD.  Board Certified in Internal Medicine, Pulmonary Medicine, Critical Care Medicine, and Sleep Medicine.  [facility] Office Number: [phone]  Santiago Glad, M.D.  Stephanie Acre, M.D.  Billy Fischer, M.D  10/29/2015 This encounter was created in error - please disregard.

## 2015-10-29 NOTE — ED Notes (Signed)
Pt used the restroom and was able to ambulate with a steady gait and w/out difficulty. UA collected and sent to the lab.

## 2015-10-29 NOTE — Progress Notes (Signed)
Per RN Case Manager plan is for patient to discharge home with home health. RN aware of above. Please reconsult if future social work needs arise. CSW signing off.   Jetta LoutBailey Morgan, LCSW [phone]

## 2015-10-29 NOTE — ED Notes (Signed)
Spoke to Patricia Gomez in social work and per Fredric MareBailey she will evaluate pt and look at [employer], MD aware of need for PT consult, pt sitting in room crying saying "I feel so alone", pt comforted and MD brought to bedside for update

## 2015-10-29 NOTE — ED Notes (Signed)
Pt informed that urine needed for UA, states unable now but will try again.  

## 2015-10-30 DIAGNOSIS — M533 Sacrococcygeal disorders, not elsewhere classified: Secondary | ICD-10-CM | POA: Diagnosis not present

## 2015-10-30 DIAGNOSIS — M5136 Other intervertebral disc degeneration, lumbar region: Secondary | ICD-10-CM | POA: Diagnosis not present

## 2015-10-30 DIAGNOSIS — Z96653 Presence of artificial knee joint, bilateral: Secondary | ICD-10-CM | POA: Diagnosis not present

## 2015-10-30 DIAGNOSIS — J439 Emphysema, unspecified: Secondary | ICD-10-CM | POA: Diagnosis not present

## 2015-10-30 DIAGNOSIS — D649 Anemia, unspecified: Secondary | ICD-10-CM | POA: Diagnosis not present

## 2015-10-30 DIAGNOSIS — I129 Hypertensive chronic kidney disease with stage 1 through stage 4 chronic kidney disease, or unspecified chronic kidney disease: Secondary | ICD-10-CM | POA: Diagnosis not present

## 2015-10-30 DIAGNOSIS — N189 Chronic kidney disease, unspecified: Secondary | ICD-10-CM | POA: Diagnosis not present

## 2015-10-30 DIAGNOSIS — Z7982 Long term (current) use of aspirin: Secondary | ICD-10-CM | POA: Diagnosis not present

## 2015-10-30 DIAGNOSIS — J45909 Unspecified asthma, uncomplicated: Secondary | ICD-10-CM | POA: Diagnosis not present

## 2015-10-30 DIAGNOSIS — G629 Polyneuropathy, unspecified: Secondary | ICD-10-CM | POA: Diagnosis not present

## 2015-10-30 DIAGNOSIS — F329 Major depressive disorder, single episode, unspecified: Secondary | ICD-10-CM | POA: Diagnosis not present

## 2015-10-30 DIAGNOSIS — M5030 Other cervical disc degeneration, unspecified cervical region: Secondary | ICD-10-CM | POA: Diagnosis not present

## 2015-10-30 DIAGNOSIS — M5481 Occipital neuralgia: Secondary | ICD-10-CM | POA: Diagnosis not present

## 2015-10-31 DIAGNOSIS — R52 Pain, unspecified: Secondary | ICD-10-CM | POA: Diagnosis not present

## 2015-11-01 ENCOUNTER — Emergency Department: Payer: Medicare Other

## 2015-11-01 ENCOUNTER — Emergency Department
Admission: EM | Admit: 2015-11-01 | Discharge: 2015-11-01 | Disposition: A | Discharge status: Home / Self Care | Payer: Medicare Other | Attending: Emergency Medicine | Admitting: Emergency Medicine

## 2015-11-01 DIAGNOSIS — Z79899 Other long term (current) drug therapy: Secondary | ICD-10-CM | POA: Diagnosis not present

## 2015-11-01 DIAGNOSIS — Z7951 Long term (current) use of inhaled steroids: Secondary | ICD-10-CM | POA: Insufficient documentation

## 2015-11-01 DIAGNOSIS — F419 Anxiety disorder, unspecified: Secondary | ICD-10-CM | POA: Diagnosis present

## 2015-11-01 DIAGNOSIS — R0789 Other chest pain: Secondary | ICD-10-CM | POA: Diagnosis not present

## 2015-11-01 DIAGNOSIS — E785 Hyperlipidemia, unspecified: Secondary | ICD-10-CM | POA: Diagnosis not present

## 2015-11-01 DIAGNOSIS — G8929 Other chronic pain: Secondary | ICD-10-CM | POA: Diagnosis not present

## 2015-11-01 DIAGNOSIS — Z7982 Long term (current) use of aspirin: Secondary | ICD-10-CM | POA: Insufficient documentation

## 2015-11-01 DIAGNOSIS — Z87891 Personal history of nicotine dependence: Secondary | ICD-10-CM | POA: Diagnosis not present

## 2015-11-01 DIAGNOSIS — F329 Major depressive disorder, single episode, unspecified: Secondary | ICD-10-CM | POA: Insufficient documentation

## 2015-11-01 DIAGNOSIS — J45909 Unspecified asthma, uncomplicated: Secondary | ICD-10-CM | POA: Insufficient documentation

## 2015-11-01 DIAGNOSIS — I10 Essential (primary) hypertension: Secondary | ICD-10-CM | POA: Diagnosis not present

## 2015-11-01 LAB — CBC
HEMATOCRIT: 32.6 % — AB (ref 35.0–47.0)
Hemoglobin: 10.8 g/dL — ABNORMAL LOW (ref 12.0–16.0)
MCH: 24.9 pg — AB (ref 26.0–34.0)
MCHC: 33.1 g/dL (ref 32.0–36.0)
MCV: 75.3 fL — AB (ref 80.0–100.0)
Platelets: 411 10*3/uL (ref 150–440)
RBC: 4.33 MIL/uL (ref 3.80–5.20)
RDW: 19.6 % — AB (ref 11.5–14.5)
WBC: 8.1 10*3/uL (ref 3.6–11.0)

## 2015-11-01 LAB — BASIC METABOLIC PANEL
ANION GAP: 9 (ref 5–15)
BUN: 9 mg/dL (ref 6–20)
CO2: 26 mmol/L (ref 22–32)
Calcium: 8.9 mg/dL (ref 8.9–10.3)
Chloride: 98 mmol/L — ABNORMAL LOW (ref 101–111)
Creatinine, Ser: 0.98 mg/dL (ref 0.44–1.00)
GFR calc Af Amer: >60 mL/min (ref >60)
GFR calc non Af Amer: 57 mL/min — ABNORMAL LOW (ref >60)
GLUCOSE: 133 mg/dL — AB (ref 65–99)
POTASSIUM: 3.6 mmol/L (ref 3.5–5.1)
Sodium: 133 mmol/L — ABNORMAL LOW (ref 135–145)

## 2015-11-01 LAB — URINE DRUG SCREEN, QUALITATIVE (ARMC ONLY)
AMPHETAMINES, UR SCREEN: NOT DETECTED
Barbiturates, Ur Screen: NOT DETECTED
Benzodiazepine, Ur Scrn: POSITIVE — AB
Cannabinoid 50 Ng, Ur ~~LOC~~: NOT DETECTED
Cocaine Metabolite,Ur ~~LOC~~: NOT DETECTED
MDMA (ECSTASY) UR SCREEN: NOT DETECTED
Methadone Scn, Ur: NOT DETECTED
Opiate, Ur Screen: NOT DETECTED
Phencyclidine (PCP) Ur S: NOT DETECTED
TRICYCLIC, UR SCREEN: NOT DETECTED

## 2015-11-01 LAB — TROPONIN I

## 2015-11-01 LAB — SALICYLATE LEVEL: Salicylate Lvl: <4 mg/dL (ref 2.8–30.0)

## 2015-11-01 LAB — ETHANOL: Alcohol, Ethyl (B): <5 mg/dL (ref <5)

## 2015-11-01 LAB — ACETAMINOPHEN LEVEL

## 2015-11-01 MED ORDER — IBUPROFEN 600 MG PO TABS
ORAL_TABLET | ORAL | Status: AC
  Filled 2015-11-01: qty 1

## 2015-11-01 MED ORDER — IBUPROFEN 600 MG PO TABS
600.0000 mg | ORAL_TABLET | Freq: Once | ORAL | Status: AC
  Administered 2015-11-01: 600 mg via ORAL

## 2015-11-01 MED ORDER — SERTRALINE HCL 25 MG PO TABS
25.0000 mg | ORAL_TABLET | Freq: Every day | ORAL | Status: DC
  Administered 2015-11-01: 25 mg via ORAL
  Filled 2015-11-01: qty 1

## 2015-11-01 MED ORDER — ONDANSETRON HCL 4 MG PO TABS: 4.0000 mg | ORAL_TABLET | Freq: Three times a day (TID) | ORAL | Status: DC | PRN

## 2015-11-01 MED ORDER — LORAZEPAM 1 MG PO TABS
1.0000 mg | ORAL_TABLET | Freq: Once | ORAL | Status: AC
  Administered 2015-11-01: 1 mg via ORAL

## 2015-11-01 MED ORDER — MOMETASONE FURO-FORMOTEROL FUM 100-5 MCG/ACT IN AERO
2.0000 | INHALATION_SPRAY | Freq: Two times a day (BID) | RESPIRATORY_TRACT | Status: DC
  Administered 2015-11-01: 2 via RESPIRATORY_TRACT
  Filled 2015-11-01: qty 8.8

## 2015-11-01 MED ORDER — PANTOPRAZOLE SODIUM 40 MG PO TBEC
40.0000 mg | DELAYED_RELEASE_TABLET | Freq: Every day | ORAL | Status: DC
  Administered 2015-11-01: 40 mg via ORAL
  Filled 2015-11-01: qty 1

## 2015-11-01 MED ORDER — PROPRANOLOL HCL 10 MG PO TABS
10.0000 mg | ORAL_TABLET | Freq: Three times a day (TID) | ORAL | Status: DC
  Administered 2015-11-01: 10 mg via ORAL
  Filled 2015-11-01 (×2): qty 1

## 2015-11-01 MED ORDER — LORAZEPAM 1 MG PO TABS
ORAL_TABLET | ORAL | Status: AC
  Administered 2015-11-01: 1 mg via ORAL
  Filled 2015-11-01: qty 1

## 2015-11-01 MED ORDER — LORAZEPAM 2 MG/ML IJ SOLN
2.0000 mg | Freq: Once | INTRAMUSCULAR | Status: AC
  Administered 2015-11-01: 2 mg via INTRAVENOUS
  Filled 2015-11-01: qty 1

## 2015-11-01 MED ORDER — MELOXICAM 15 MG PO TABS
15.0000 mg | ORAL_TABLET | Freq: Every day | ORAL | Status: DC
  Administered 2015-11-01: 15 mg via ORAL
  Filled 2015-11-01: qty 1
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
## 2015-11-01 NOTE — ED Provider Notes (Signed)
[facility] Emergency Department Provider Note   ____________________________________________  Time seen: Approximately 0032 AM  I have reviewed the triage vital signs and the nursing notes.   HISTORY  Chief Complaint Psychiatric Evaluation    HPI Patricia Gomez is a 70 y.o. female who comes into the hospital today with anxiety and psychiatric illness. According to EMS they were called out for psych. The patient's neighbor called and said that she was yelling and rolling around on the floor. The patient is complaining of pins and needle sensation all over her body. She reports that her nerves are shot and she feels like she is having a nervous breakdown. She has been under a lot of stress and has anxiety attacks. She reports that she has been placed on Zoloft for this this week and has been taking her medicine. She reports that she was here before with a headache and that she says her headache is all over her body now. She reports that she's having some depression. She rates her body pain a 10 out of 10 in intensity. She reports that she wishes she were dead and she is having some mild chest pain and tightness. The patient is here for evaluation by EMS.   Past Medical History  Diagnosis Date  . Hx of migraines   . Anxiety   . Osteopenia   . Neuropathy (HCC)   . GERD (gastroesophageal reflux disease)   . Hyperlipidemia   . Depression   . Hypertension   . Chronic kidney disease   . Anemia   . DJD (degenerative joint disease) of knee   . Asthma   . Shortness of breath dyspnea   . Wheezing   . Incontinence   . Panic attack     Patient Active Problem List   Diagnosis Date Noted  . Pneumonia 09/15/2015  . Anxiety 08/20/2015  . Asthma 05/06/2015  . Anemia   . Hyperlipidemia   . Depression   . Osteopenia   . Essential hypertension   . Chronic kidney disease   . Neuropathy (HCC)   . DJD (degenerative joint disease) of knee   . Failed back  surgical syndrome   . Vaginal atrophy 12/16/2014  . DDD (degenerative disc disease), cervical 10/21/2014  . DDD (degenerative disc disease), lumbar 10/21/2014  . Facet syndrome 10/21/2014  . Degenerative joint disease of sacroiliac joint 10/21/2014  . Bilateral occipital neuralgia 10/21/2014    Past Surgical History  Procedure Laterality Date  . Replacement total knee bilateral Bilateral 2011  . Abdominal hysterectomy    . Eye surgery Bilateral 2013  . Oophorectomy      with tumor removed before hysterectomy  . Tubal ligation    . Joint replacement      bilateral knees  . Breast enhancement surgery    . Intraocular lens insertion Bilateral   . Carpometacarpal (cmc) fusion of thumb Left 05/21/2015    Procedure: CARPOMETACARPAL (CMC) FUSION OF THUMB;  Surgeon: Christena Flake, MD;  Location: [facility] ORS;  Service: Orthopedics;  Laterality: Left;  . Ganglion cyst excision Left 05/21/2015    Procedure: REMOVAL GANGLION OF WRIST;  Surgeon: Christena Flake, MD;  Location: [facility] ORS;  Service: Orthopedics;  Laterality: Left;    Current Outpatient Rx  Name  Route  Sig  Dispense  Refill  . albuterol (PROVENTIL HFA;VENTOLIN HFA) 108 (90 Base) MCG/ACT inhaler   Inhalation   Inhale 2 puffs into the lungs every 6 (six) hours as needed  for wheezing or shortness of breath.   1 Inhaler   0   . aspirin EC 81 MG tablet   Oral   Take 81 mg by mouth daily.         . diphenhydrAMINE (BENADRYL) 25 MG tablet   Oral   Take 25-50 mg by mouth 4 (four) times daily.         . ergocalciferol (VITAMIN D2) 50000 units capsule   Oral   Take 50,000 Units by mouth once a week.         . ferrous sulfate 325 (65 FE) MG tablet   Oral   Take 325 mg by mouth daily with breakfast.         . Fluticasone-Salmeterol (ADVAIR) 100-50 MCG/DOSE AEPB   Inhalation   Inhale 1 puff into the lungs 2 (two) times daily.         . nitrofurantoin, macrocrystal-monohydrate, (MACROBID) 100 MG capsule   Oral   Take 100  mg by mouth 2 (two) times daily.         Marland Kitchen omeprazole (PRILOSEC) 20 MG capsule   Oral   Take 20 mg by mouth daily.         . Potassium Gluconate 550 MG TABS   Oral   Take 550 mg by mouth daily.         . propranolol (INDERAL) 10 MG tablet   Oral   Take 10 mg by mouth 3 (three) times daily.         . sertraline (ZOLOFT) 25 MG tablet   Oral   Take 25 mg by mouth daily.         . traMADol (ULTRAM) 50 MG tablet   Oral   Take 50 mg by mouth 3 (three) times daily.         . benazepril (LOTENSIN) 40 MG tablet   Oral   Take 1 tablet (40 mg total) by mouth daily. Patient not taking: Reported on 11/01/2015   30 tablet   6   . levofloxacin (LEVAQUIN) 750 MG tablet   Oral   Take 1 tablet (750 mg total) by mouth daily. Patient not taking: Reported on 11/01/2015   5 tablet   0   . ondansetron (ZOFRAN) 4 MG tablet   Oral   Take 1 tablet (4 mg total) by mouth every 8 (eight) hours as needed for nausea or vomiting.   10 tablet   0     Allergies Penicillins; Flagyl; and Prednisone  Family History  Problem Relation Age of Onset  . Muscular dystrophy Sister   . Heart disease Sister   . Mental illness Brother   . Heart disease Mother   . Mental illness Mother   . Heart attack Father     Social History Social History  Substance Use Topics  . Smoking status: Former Smoker    Quit date: 10/20/2008  . Smokeless tobacco: Never Used  . Alcohol Use: No    Review of Systems Constitutional: No fever/chills Eyes: No visual changes. ENT: No sore throat. Cardiovascular: Denies chest pain. Respiratory: Denies shortness of breath. Gastrointestinal: No abdominal pain.  No nausea, no vomiting.  No diarrhea.  No constipation. Genitourinary: Negative for dysuria. Musculoskeletal: All over body pain Skin: Negative for rash. Neurological: Negative for headaches, focal weakness or numbness. Psychiatric:Anxiety  10-point ROS otherwise  negative.  ____________________________________________   PHYSICAL EXAM:  VITAL SIGNS: ED Triage Vitals  Enc Vitals Group     BP 11/01/15  0037 170/113 mmHg     Pulse Rate 11/01/15 0034 78     Resp 11/01/15 0034 16     Temp 11/01/15 0039 97.8 F (36.6 C)     Temp Source 11/01/15 0039 Oral     SpO2 11/01/15 0034 95 %     Weight --      Height --      Head Cir --      Peak Flow --      Pain Score 11/01/15 0035 10     Pain Loc --      Pain Edu? --      Excl. in GC? --     Constitutional: Alert and oriented. Well appearing and in Moderate distress. Eyes: Conjunctivae are normal. PERRL. EOMI. Head: Atraumatic. Nose: No congestion/rhinnorhea. Mouth/Throat: Mucous membranes are moist.  Oropharynx non-erythematous. Cardiovascular: Normal rate, regular rhythm. Grossly normal heart sounds.  Good peripheral circulation. Respiratory: Normal respiratory effort.  No retractions. Lungs CTAB. Gastrointestinal: Soft and nontender. No distention. Positive bowel sounds Musculoskeletal: No lower extremity tenderness nor edema.   Neurologic:  Normal speech and language. No gross focal neurologic deficits are appreciated.  Skin:  Skin is warm, dry and intact.  Psychiatric: Mood and affect are normal.   ____________________________________________   LABS (all labs ordered are listed, but only abnormal results are displayed)  Labs Reviewed  CBC - Abnormal; Notable for the following:    Hemoglobin 10.8 (*)    HCT 32.6 (*)    MCV 75.3 (*)    MCH 24.9 (*)    RDW 19.6 (*)    All other components within normal limits  BASIC METABOLIC PANEL - Abnormal; Notable for the following:    Sodium 133 (*)    Chloride 98 (*)    Glucose, Bld 133 (*)    GFR calc non Af Amer 57 (*)    All other components within normal limits  ACETAMINOPHEN LEVEL - Abnormal; Notable for the following:    Acetaminophen (Tylenol), Serum <10 (*)    All other components within normal limits  URINE DRUG SCREEN,  QUALITATIVE ([facility] ONLY) - Abnormal; Notable for the following:    Benzodiazepine, Ur Scrn POSITIVE (*)    All other components within normal limits  TROPONIN I  ETHANOL  SALICYLATE LEVEL   ____________________________________________  EKG  ED ECG REPORT I, Rebecka Apley, the attending physician, personally viewed and interpreted this ECG.   Date: 11/01/2015  EKG Time: 0038  Rate: 63  Rhythm: normal sinus rhythm  Axis: normal  Intervals:none  ST&T Change: none  ____________________________________________  RADIOLOGY  Chest x-ray: No active cardiopulmonary disease ____________________________________________   PROCEDURES  Procedure(s) performed: None  Critical Care performed: No  ____________________________________________   INITIAL IMPRESSION / ASSESSMENT AND PLAN / ED COURSE  Pertinent labs & imaging results that were available during my care of the patient were reviewed by me and considered in my medical decision making (see chart for details).  This is a 70 year old female who comes into the hospital today having a nervous breakdown. The patient was seen recently and is having lots of stress and difficulty coping at home. The patient did receive a dose of Ativan 2 mg IV 1 which did help her symptoms.  The patient was seen by TTS and the recommendation was that the patient be admitted for inpatient geriatric psychiatry. They will attempt to have the patient placed in a geriatric psychiatry facility. The patient received another dose of Ativan for anxiety.  ____________________________________________   FINAL CLINICAL IMPRESSION(S) / ED DIAGNOSES  Final diagnoses:  Anxiety      NEW MEDICATIONS STARTED DURING THIS VISIT:  New Prescriptions   No medications on file     Note:  This document was prepared using Dragon voice recognition software and may include unintentional dictation errors.    Rebecka ApleyAllison P Lukus Binion, MD 11/01/15 [phone]
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
## 2015-11-01 NOTE — Discharge Instructions (Signed)
Chronic Pain  Chronic pain can be defined as pain that is off and on and lasts for 3-6 months or longer. Many things cause chronic pain, which can make it difficult to make a diagnosis. There are many treatment options available for chronic pain. However, finding a treatment that works well for you may require trying various approaches until the right one is found. Many people benefit from a combination of two or more types of treatment to control their pain.  SYMPTOMS   Chronic pain can occur anywhere in the body and can range from mild to very severe. Some types of chronic pain include:  · Headache.  · Low back pain.  · Cancer pain.  · Arthritis pain.  · Neurogenic pain. This is pain resulting from damage to nerves.   People with chronic pain may also have other symptoms such as:  · Depression.  · Anger.  · Insomnia.  · Anxiety.  DIAGNOSIS   Your health care provider will help diagnose your condition over time. In many cases, the initial focus will be on excluding possible conditions that could be causing the pain. Depending on your symptoms, your health care provider may order tests to diagnose your condition. Some of these tests may include:   · Blood tests.    · CT scan.    · MRI.    · X-rays.    · Ultrasounds.    · Nerve conduction studies.    You may need to see a specialist.   TREATMENT   Finding treatment that works well may take time. You may be referred to a pain specialist. He or she may prescribe medicine or therapies, such as:   · Mindful meditation or yoga.  · Shots (injections) of numbing or pain-relieving medicines into the spine or area of pain.  · Local electrical stimulation.  · Acupuncture.    · Massage therapy.    · Aroma, color, light, or sound therapy.    · Biofeedback.    · Working with a physical therapist to keep from getting stiff.    · Regular, gentle exercise.    · Cognitive or behavioral therapy.    · Group support.    Sometimes, surgery may be recommended.   HOME CARE INSTRUCTIONS    · Take all medicines as directed by your health care provider.    · Lessen stress in your life by relaxing and doing things such as listening to calming music.    · Exercise or be active as directed by your health care provider.    · Eat a healthy diet and include things such as vegetables, fruits, fish, and lean meats in your diet.    · Keep all follow-up appointments with your health care provider.    · Attend a support group with others suffering from chronic pain.  SEEK MEDICAL CARE IF:   · Your pain gets worse.    · You develop a new pain that was not there before.    · You cannot tolerate medicines given to you by your health care provider.    · You have new symptoms since your last visit with your health care provider.    SEEK IMMEDIATE MEDICAL CARE IF:   · You feel weak.    · You have decreased sensation or numbness.    · You lose control of bowel or bladder function.    · Your pain suddenly gets much worse.    · You develop shaking.  · You develop chills.  · You develop confusion.  · You develop chest pain.  · You develop shortness of breath.    MAKE SURE YOU:  ·   Document Revised: 01/30/2013 Document Reviewed: 11/23/2012 Elsevier Interactive Patient Education 2016 [employer]  Panic Attacks Panic attacks are sudden, short-livedsurges of severe anxiety, fear, or discomfort. They may occur for no reason when you are relaxed, when you are anxious, or when you are sleeping. Panic attacks may occur for a number of reasons:   Healthy people occasionally have panic attacks in extreme, life-threatening situations, such as war or natural disasters. Normal anxiety is a  protective mechanism of the body that helps us react to danger (fight or flight response).  Panic attacks are often seen with anxiety disorders, such as panic disorder, social anxiety disorder, generalized anxiety disorder, and phobias. Anxiety disorders cause excessive or uncontrollable anxiety. They may interfere with your relationships or other life activities.  Panic attacks are sometimes seen with other mental illnesses, such as depression and posttraumatic stress disorder.  Certain medical conditions, prescription medicines, and drugs of abuse can cause panic attacks. SYMPTOMS  Panic attacks start suddenly, peak within 20 minutes, and are accompanied by four or more of the following symptoms:  Pounding heart or fast heart rate (palpitations).  Sweating.  Trembling or shaking.  Shortness of breath or feeling smothered.  Feeling choked.  Chest pain or discomfort.  Nausea or strange feeling in your stomach.  Dizziness, light-headedness, or feeling like you will faint.  Chills or hot flushes.  Numbness or tingling in your lips or hands and feet.  Feeling that things are not real or feeling that you are not yourself.  Fear of losing control or going crazy.  Fear of dying. Some of these symptoms can mimic serious medical conditions. For example, you may think you are having a heart attack. Although panic attacks can be very scary, they are not life threatening. DIAGNOSIS  Panic attacks are diagnosed through an assessment by your health care provider. Your health care provider will ask questions about your symptoms, such as where and when they occurred. Your health care provider will also ask about your medical history and use of alcohol and drugs, including prescription medicines. Your health care provider may order blood tests or other studies to rule out a serious medical condition. Your health care provider may refer you to a mental health professional for further  evaluation. TREATMENT   Most healthy people who have one or two panic attacks in an extreme, life-threatening situation will not require treatment.  The treatment for panic attacks associated with anxiety disorders or other mental illness typically involves counseling with a mental health professional, medicine, or a combination of both. Your health care provider will help determine what treatment is best for you.  Panic attacks due to physical illness usually go away with treatment of the illness. If prescription medicine is causing panic attacks, talk with your health care provider about stopping the medicine, decreasing the dose, or substituting another medicine.  Panic attacks due to alcohol or drug abuse go away with abstinence. Some adults need professional help in order to stop drinking or using drugs. HOME CARE INSTRUCTIONS   Take all medicines as directed by your health care provider.   Schedule and attend follow-up visits as directed by your health care provider. It is important to keep all your appointments. SEEK MEDICAL CARE IF:  You are not able to take your medicines as prescribed.  Your symptoms do not improve or get worse. SEEK IMMEDIATE MEDICAL CARE IF:   You experience panic attack symptoms that are different than your usual symptoms.  You have serious  thoughts about hurting yourself or others.  You are taking medicine for panic attacks and have a serious side effect. MAKE SURE YOU:  Understand these instructions.  Will watch your condition.  Will get help right away if you are not doing well or get worse.   This information is not intended to replace advice given to you by your health care provider. Make sure you discuss any questions you have with your health care provider.   Document Released: 05/30/2005 Document Revised: 06/04/2013 Document Reviewed: 01/11/2013 Elsevier Interactive Patient Education [employer]

## 2015-11-01 NOTE — ED Notes (Signed)
Pt awake, alert, calm and cooperative. Up to bathroom, gait steady.

## 2015-11-01 NOTE — ED Notes (Signed)
Pt was seen here 3 nights ago.  Pt states she feels like pins and needles are sticking in her all over her body.  Pt states "my nerves are shot, I feel like I'm having a nervous breakdown".  Pt reports having anxiety attacks.  Pt jumping all over.  Pt crying without tears.

## 2015-11-01 NOTE — ED Provider Notes (Signed)
Patient has been cleared by psychiatry for discharge. Patient remains medically stable.  Patricia FilbertJonathan E Sipriano Fendley, MD 11/01/15 [phone]

## 2015-11-01 NOTE — ED Notes (Addendum)
Pt has home medications being stored in pharmacy.

## 2015-11-01 NOTE — ED Notes (Signed)
Pt was dc to home with daughter with follow up instructions to family

## 2015-11-01 NOTE — BH Assessment (Signed)
Writer spoke with the patient with to assess her current mental and emotional state. Patient reports of having no SI/HI and AV/H. She states she came to the ER in order to receive something for pain. While Clinical research associatewriter was talking with her, she was asking for pain medication. Writer asked her, three times about the SI, she stated, she didn't say it and if she did it was describing the pain she was having. She continue to deny SI and requesting to leave if she doesn't get anything for pain.

## 2015-11-01 NOTE — ED Notes (Signed)
BEHAVIORAL HEALTH ROUNDING  Patient sleeping: No.  Patient alert and oriented: yes  Behavior appropriate: Yes. ; If no, describe:  Nutrition and fluids offered: Yes  Toileting and hygiene offered: Yes  Sitter present: not applicable, Q 15 min safety rounds and observation.  Law enforcement present: Yes ODS  

## 2015-11-01 NOTE — ED Notes (Signed)
Pt transferred in to [facility] from main ed, in no acute distress

## 2015-11-01 NOTE — ED Notes (Signed)
BEHAVIORAL HEALTH ROUNDING Patient sleeping: Yes.   Patient alert and oriented: not applicable SLEEPING Behavior appropriate: Yes.  ; If no, describe: SLEEPING Nutrition and fluids offered: No SLEEPING Toileting and hygiene offered: NoSLEEPING Sitter present: not applicable, Q 15 min safety rounds and observation. Law enforcement present: Yes ODS 

## 2015-11-01 NOTE — ED Notes (Signed)
Pt was insisting that we give her morphine iv that is the only thing that helped her headaches and other pain which she was vauge about. Daughter stated that the family feared she may have an addiction problem . Resource ideas given to family. Pt also refused to sign e signature. . All her home meds were obtained from pharmacy and given to pt. Pt stopped crying and was able to ambulate alone from wheel chair to car.

## 2015-11-04 DIAGNOSIS — M5481 Occipital neuralgia: Secondary | ICD-10-CM | POA: Diagnosis not present

## 2015-11-04 DIAGNOSIS — M5030 Other cervical disc degeneration, unspecified cervical region: Secondary | ICD-10-CM | POA: Diagnosis not present

## 2015-11-04 DIAGNOSIS — M5136 Other intervertebral disc degeneration, lumbar region: Secondary | ICD-10-CM | POA: Diagnosis not present

## 2015-11-04 DIAGNOSIS — F329 Major depressive disorder, single episode, unspecified: Secondary | ICD-10-CM | POA: Diagnosis not present

## 2015-11-04 DIAGNOSIS — G629 Polyneuropathy, unspecified: Secondary | ICD-10-CM | POA: Diagnosis not present

## 2015-11-04 DIAGNOSIS — J439 Emphysema, unspecified: Secondary | ICD-10-CM | POA: Diagnosis not present

## 2015-11-05 DIAGNOSIS — F329 Major depressive disorder, single episode, unspecified: Secondary | ICD-10-CM | POA: Diagnosis not present

## 2015-11-05 DIAGNOSIS — G629 Polyneuropathy, unspecified: Secondary | ICD-10-CM | POA: Diagnosis not present

## 2015-11-05 DIAGNOSIS — M5136 Other intervertebral disc degeneration, lumbar region: Secondary | ICD-10-CM | POA: Diagnosis not present

## 2015-11-05 DIAGNOSIS — M5030 Other cervical disc degeneration, unspecified cervical region: Secondary | ICD-10-CM | POA: Diagnosis not present

## 2015-11-05 DIAGNOSIS — M5481 Occipital neuralgia: Secondary | ICD-10-CM | POA: Diagnosis not present

## 2015-11-05 DIAGNOSIS — J439 Emphysema, unspecified: Secondary | ICD-10-CM | POA: Diagnosis not present

## 2015-11-06 DIAGNOSIS — M5136 Other intervertebral disc degeneration, lumbar region: Secondary | ICD-10-CM | POA: Diagnosis not present

## 2015-11-06 DIAGNOSIS — F329 Major depressive disorder, single episode, unspecified: Secondary | ICD-10-CM | POA: Diagnosis not present

## 2015-11-06 DIAGNOSIS — J439 Emphysema, unspecified: Secondary | ICD-10-CM | POA: Diagnosis not present

## 2015-11-06 DIAGNOSIS — G629 Polyneuropathy, unspecified: Secondary | ICD-10-CM | POA: Diagnosis not present

## 2015-11-06 DIAGNOSIS — M5030 Other cervical disc degeneration, unspecified cervical region: Secondary | ICD-10-CM | POA: Diagnosis not present

## 2015-11-06 DIAGNOSIS — M5481 Occipital neuralgia: Secondary | ICD-10-CM | POA: Diagnosis not present

## 2015-11-11 ENCOUNTER — Ambulatory Visit: Payer: Medicare Other | Admitting: Gastroenterology

## 2015-11-12 ENCOUNTER — Ambulatory Visit: Payer: Medicare Other | Admitting: Family Medicine

## 2015-11-12 DIAGNOSIS — M5136 Other intervertebral disc degeneration, lumbar region: Secondary | ICD-10-CM | POA: Diagnosis not present

## 2015-11-12 DIAGNOSIS — F329 Major depressive disorder, single episode, unspecified: Secondary | ICD-10-CM | POA: Diagnosis not present

## 2015-11-12 DIAGNOSIS — M5030 Other cervical disc degeneration, unspecified cervical region: Secondary | ICD-10-CM | POA: Diagnosis not present

## 2015-11-12 DIAGNOSIS — J439 Emphysema, unspecified: Secondary | ICD-10-CM | POA: Diagnosis not present

## 2015-11-12 DIAGNOSIS — M5481 Occipital neuralgia: Secondary | ICD-10-CM | POA: Diagnosis not present

## 2015-11-12 DIAGNOSIS — G629 Polyneuropathy, unspecified: Secondary | ICD-10-CM | POA: Diagnosis not present

## 2015-11-13 DIAGNOSIS — M5481 Occipital neuralgia: Secondary | ICD-10-CM | POA: Diagnosis not present

## 2015-11-13 DIAGNOSIS — G629 Polyneuropathy, unspecified: Secondary | ICD-10-CM | POA: Diagnosis not present

## 2015-11-13 DIAGNOSIS — J439 Emphysema, unspecified: Secondary | ICD-10-CM | POA: Diagnosis not present

## 2015-11-13 DIAGNOSIS — M5136 Other intervertebral disc degeneration, lumbar region: Secondary | ICD-10-CM | POA: Diagnosis not present

## 2015-11-13 DIAGNOSIS — F329 Major depressive disorder, single episode, unspecified: Secondary | ICD-10-CM | POA: Diagnosis not present

## 2015-11-13 DIAGNOSIS — M5030 Other cervical disc degeneration, unspecified cervical region: Secondary | ICD-10-CM | POA: Diagnosis not present

## 2015-11-17 DIAGNOSIS — M5030 Other cervical disc degeneration, unspecified cervical region: Secondary | ICD-10-CM | POA: Diagnosis not present

## 2015-11-17 DIAGNOSIS — M5136 Other intervertebral disc degeneration, lumbar region: Secondary | ICD-10-CM | POA: Diagnosis not present

## 2015-11-17 DIAGNOSIS — F329 Major depressive disorder, single episode, unspecified: Secondary | ICD-10-CM | POA: Diagnosis not present

## 2015-11-17 DIAGNOSIS — M5481 Occipital neuralgia: Secondary | ICD-10-CM | POA: Diagnosis not present

## 2015-11-17 DIAGNOSIS — J439 Emphysema, unspecified: Secondary | ICD-10-CM | POA: Diagnosis not present

## 2015-11-17 DIAGNOSIS — G629 Polyneuropathy, unspecified: Secondary | ICD-10-CM | POA: Diagnosis not present

## 2015-11-19 ENCOUNTER — Encounter: Payer: Self-pay | Admitting: Internal Medicine

## 2015-11-19 ENCOUNTER — Ambulatory Visit (INDEPENDENT_AMBULATORY_CARE_PROVIDER_SITE_OTHER): Payer: Medicare Other | Admitting: Internal Medicine

## 2015-11-19 VITALS — BP 148/82 | HR 57 | Ht 61.0 in | Wt 182.0 lb

## 2015-11-19 DIAGNOSIS — J438 Other emphysema: Secondary | ICD-10-CM | POA: Diagnosis not present

## 2015-11-19 DIAGNOSIS — F329 Major depressive disorder, single episode, unspecified: Secondary | ICD-10-CM | POA: Diagnosis not present

## 2015-11-19 DIAGNOSIS — G629 Polyneuropathy, unspecified: Secondary | ICD-10-CM | POA: Diagnosis not present

## 2015-11-19 DIAGNOSIS — M5481 Occipital neuralgia: Secondary | ICD-10-CM | POA: Diagnosis not present

## 2015-11-19 DIAGNOSIS — M5030 Other cervical disc degeneration, unspecified cervical region: Secondary | ICD-10-CM | POA: Diagnosis not present

## 2015-11-19 DIAGNOSIS — J439 Emphysema, unspecified: Secondary | ICD-10-CM | POA: Diagnosis not present

## 2015-11-19 DIAGNOSIS — M5136 Other intervertebral disc degeneration, lumbar region: Secondary | ICD-10-CM | POA: Diagnosis not present

## 2015-11-19 MED ORDER — TIOTROPIUM BROMIDE-OLODATEROL 2.5-2.5 MCG/ACT IN AERS
2.0000 | INHALATION_SPRAY | Freq: Every day | RESPIRATORY_TRACT | Status: DC
Start: 2015-11-19 — End: 2015-11-20

## 2015-11-19 NOTE — Patient Instructions (Addendum)
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   --Start stiolto inhaler, 2 puffs once daily. If not covered, will start spiriva respimat.   --[facility] to [facility] for [facility] lung cancer screening.

## 2015-11-19 NOTE — Addendum Note (Signed)
Addended by: Meyer CoryAHMAD, MISTY R on: 11/19/2015 11:05 AM   Modules accepted: Orders

## 2015-11-19 NOTE — Progress Notes (Signed)
[facility] Medicine Consultation      Assessment and Plan:  Emphysema. --Pt prefers not to be on a dry powder, therefore will start Stiolto inhaler 2 puffs once daily.    Chronic respiratory failure. --Pt very dyspneic with just walking to examination therapy today.  --She is being enrolled in [facility], would continue with PT. If no longer a candidate, may benefit from pulmonary rehab.   Debility/Deconditioning.  --Continue PT, will consider pulm rehab in future.   Date: 11/19/2015  MRN# [id number] ANIESA BOBACK December 28, 1945  Referring Physician: Dr. Beaulah Corin is a 70 y.o. old female seen in consultation for chief complaint of:    Chief Complaint  Patient presents with  . Advice Only    chest tightness; SOB w/activity; NP cough at times;    HPI:   She notes that she has had trouble with wheezing when working outside, she also used to go walking up to 3 miles, she had no trouble with that back in those things.  She was started on an inhaler at some point probably, she thinks, because she would wheeze when she would chase her dog. She was given an inhaler of albuterol, she used it once per week or two, she does think that it helped. She was admitted here with pneumonia and sepsis, since then she has been weak, she is going through rehab.   Recently she was started on advair twice daily, but again did not feel that it was helping, so it was stopped.   She last smoked 7 years ago, about a pack per day. [place]. She worked at [employer], no occupational exposures.   Review of outside records, no  desaturations with ambulation, no evidence of renal dysfunction, mild anemia.  Chest x-ray 11/01/15: View chest x-ray images shows hyperinflation, consistent with emphysema, not significantly changed from previous films taken of this year.. Review of CT of the abdomen on 10/13/15 shows lower quarter of lungs, which appeared to be mildly  emphysematous.  Interpretation of spirometry: FVC is 2.53 L, which is 102% of predicted, FEV1 was 1.69 L, which is 97% of predicted, FEV to FVC ratio was 67%. There was no significant improvement with bronchodilator therapy. This test is suggestive of mild obstructive lung disease.  PMHX:   Past Medical History  Diagnosis Date  . Hx of migraines   . Anxiety   . Osteopenia   . Neuropathy (HCC)   . GERD (gastroesophageal reflux disease)   . Hyperlipidemia   . Depression   . Hypertension   . Chronic kidney disease   . Anemia   . DJD (degenerative joint disease) of knee   . Asthma   . Shortness of breath dyspnea   . Wheezing   . Incontinence   . Panic attack    Surgical Hx:  Past Surgical History  Procedure Laterality Date  . Replacement total knee bilateral Bilateral 2011  . Abdominal hysterectomy    . Eye surgery Bilateral 2013  . Oophorectomy      with tumor removed before hysterectomy  . Tubal ligation    . Joint replacement      bilateral knees  . Breast enhancement surgery    . Intraocular lens insertion Bilateral   . Carpometacarpal (cmc) fusion of thumb Left 05/21/2015    Procedure: CARPOMETACARPAL (CMC) FUSION OF THUMB;  Surgeon: Christena Flake, MD;  Location: [facility] ORS;  Service: Orthopedics;  Laterality: Left;  . Ganglion cyst  excision Left 05/21/2015    Procedure: REMOVAL GANGLION OF WRIST;  Surgeon: Christena FlakeJohn J Poggi, MD;  Location: [facility] ORS;  Service: Orthopedics;  Laterality: Left;   Family Hx:  Family History  Problem Relation Age of Onset  . Muscular dystrophy Sister   . Heart disease Sister   . Mental illness Brother   . Heart disease Mother   . Mental illness Mother   . Heart attack Father    Social Hx:   Social History  Substance Use Topics  . Smoking status: Former Smoker    Quit date: 10/20/2008  . Smokeless tobacco: Never Used  . Alcohol Use: No   Medication:   Current Outpatient Rx  Name  Route  Sig  Dispense  Refill  . albuterol (PROVENTIL  HFA;VENTOLIN HFA) 108 (90 Base) MCG/ACT inhaler   Inhalation   Inhale 2 puffs into the lungs every 6 (six) hours as needed for wheezing or shortness of breath.   1 Inhaler   0   . aspirin EC 81 MG tablet   Oral   Take 81 mg by mouth daily.         . benazepril (LOTENSIN) 40 MG tablet   Oral   Take 1 tablet (40 mg total) by mouth daily. Patient not taking: Reported on 11/01/2015   30 tablet   6   . diphenhydrAMINE (BENADRYL) 25 MG tablet   Oral   Take 25-50 mg by mouth 4 (four) times daily.         . ergocalciferol (VITAMIN D2) 50000 units capsule   Oral   Take 50,000 Units by mouth once a week.         . ferrous sulfate 325 (65 FE) MG tablet   Oral   Take 325 mg by mouth daily with breakfast.         . Fluticasone-Salmeterol (ADVAIR) 100-50 MCG/DOSE AEPB   Inhalation   Inhale 1 puff into the lungs 2 (two) times daily.         Marland Kitchen. levofloxacin (LEVAQUIN) 750 MG tablet   Oral   Take 1 tablet (750 mg total) by mouth daily. Patient not taking: Reported on 11/01/2015   5 tablet   0   . omeprazole (PRILOSEC) 20 MG capsule   Oral   Take 20 mg by mouth daily.         . ondansetron (ZOFRAN) 4 MG tablet   Oral   Take 1 tablet (4 mg total) by mouth every 8 (eight) hours as needed for nausea or vomiting.   10 tablet   0   . Potassium Gluconate 550 MG TABS   Oral   Take 550 mg by mouth daily.         . propranolol (INDERAL) 10 MG tablet   Oral   Take 10 mg by mouth 3 (three) times daily.         . sertraline (ZOLOFT) 25 MG tablet   Oral   Take 25 mg by mouth daily.         . traMADol (ULTRAM) 50 MG tablet   Oral   Take 50 mg by mouth 3 (three) times daily.             Allergies:  Penicillins; Flagyl; and Prednisone  Review of Systems: Gen:  Denies  fever, sweats, chills HEENT: Denies blurred vision, double vision. bleeds, sore throat Cvc:  No dizziness, chest pain. Resp:   Denies cough or sputum production, shortness of breath Gi: Denies  swallowing difficulty, stomach pain. Gu:  Denies bladder incontinence, burning urine Ext:   No Joint pain, stiffness. Skin: No skin rash,  hives  Endoc:  No polyuria, polydipsia. Psych: No depression, insomnia. Other:  All other systems were reviewed with the patient and were negative other that what is mentioned in the HPI.   Physical Examination:   VS: BP 148/82 mmHg  Pulse 57  Ht 5\' 1"  (1.549 m)  Wt 182 lb (82.555 kg)  BMI 34.41 kg/m2  SpO2 97%  General Appearance: No distress  Neuro:without focal findings,  speech normal,  HEENT: PERRLA, EOM intact.   Pulmonary: normal breath sounds, No wheezing. Decrease air entry bilaterally.  CardiovascularNormal S1,S2.  No m/r/g.   Abdomen: Benign, Soft, non-tender. Renal:  No costovertebral tenderness  GU:  No performed at this time. Endoc: No evident thyromegaly, no signs of acromegaly. Skin:   warm, no rashes, no ecchymosis  Extremities: normal, no cyanosis, clubbing.  Other findings:    LABORATORY PANEL:   CBC No results for input(s): WBC, HGB, HCT, PLT in the last 168 hours. ------------------------------------------------------------------------------------------------------------------  Chemistries  No results for input(s): NA, K, CL, CO2, GLUCOSE, BUN, CREATININE, CALCIUM, MG, AST, ALT, ALKPHOS, BILITOT in the last 168 hours.  Invalid input(s): GFRCGP ------------------------------------------------------------------------------------------------------------------  Cardiac Enzymes No results for input(s): TROPONINI in the last 168 hours. ------------------------------------------------------------  RADIOLOGY:  No results found.     Thank  you for the consultation and for allowing [facility] to assist in the care of your patient. Our recommendations are noted above.  Please contact us if we can be of further service.   Wells Guiles, MD.  Board Certified in Internal Medicine,  Pulmonary Medicine, Critical Care Medicine, and Sleep Medicine.  [facility] Office Number: [phone]  Santiago Glad, M.D.  Stephanie Acre, M.D.  Billy Fischer, M.D  11/19/2015

## 2015-11-20 ENCOUNTER — Telehealth: Payer: Self-pay | Admitting: *Deleted

## 2015-11-20 MED ORDER — UMECLIDINIUM-VILANTEROL 62.5-25 MCG/INH IN AEPB: 1.0000 | INHALATION_SPRAY | Freq: Every day | RESPIRATORY_TRACT | Status: DC

## 2015-11-20 NOTE — Telephone Encounter (Signed)
Received fax that the Sunrise Canyontiolto Resp is a $500 copay. Per AVS I will send Spiriva Respimat. Spiriva REsp is not covered either. Per DK will send Anoro to pharmacy. RX sent. Nothing further needed.

## 2015-11-23 DIAGNOSIS — G629 Polyneuropathy, unspecified: Secondary | ICD-10-CM | POA: Diagnosis not present

## 2015-11-23 DIAGNOSIS — M5030 Other cervical disc degeneration, unspecified cervical region: Secondary | ICD-10-CM | POA: Diagnosis not present

## 2015-11-23 DIAGNOSIS — M5136 Other intervertebral disc degeneration, lumbar region: Secondary | ICD-10-CM | POA: Diagnosis not present

## 2015-11-23 DIAGNOSIS — M5481 Occipital neuralgia: Secondary | ICD-10-CM | POA: Diagnosis not present

## 2015-11-23 DIAGNOSIS — J439 Emphysema, unspecified: Secondary | ICD-10-CM | POA: Diagnosis not present

## 2015-11-23 DIAGNOSIS — F329 Major depressive disorder, single episode, unspecified: Secondary | ICD-10-CM | POA: Diagnosis not present

## 2015-11-25 ENCOUNTER — Encounter: Payer: Self-pay | Admitting: Psychiatry

## 2015-11-25 ENCOUNTER — Ambulatory Visit (INDEPENDENT_AMBULATORY_CARE_PROVIDER_SITE_OTHER): Payer: Medicare Other | Admitting: Psychiatry

## 2015-11-25 VITALS — BP 122/78 | HR 65 | Temp 97.4°F | Ht 61.0 in | Wt 184.8 lb

## 2015-11-25 DIAGNOSIS — Z8659 Personal history of other mental and behavioral disorders: Secondary | ICD-10-CM | POA: Diagnosis not present

## 2015-11-25 DIAGNOSIS — F331 Major depressive disorder, recurrent, moderate: Secondary | ICD-10-CM

## 2015-11-25 MED ORDER — GABAPENTIN 100 MG PO CAPS: 100.0000 mg | ORAL_CAPSULE | Freq: Every day | ORAL | Status: DC

## 2015-11-25 MED ORDER — MIRTAZAPINE 15 MG PO TABS: 15.0000 mg | ORAL_TABLET | Freq: Every day | ORAL | Status: DC
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
## 2015-11-26 DIAGNOSIS — G629 Polyneuropathy, unspecified: Secondary | ICD-10-CM | POA: Diagnosis not present

## 2015-11-26 DIAGNOSIS — M5481 Occipital neuralgia: Secondary | ICD-10-CM | POA: Diagnosis not present

## 2015-11-26 DIAGNOSIS — F329 Major depressive disorder, single episode, unspecified: Secondary | ICD-10-CM | POA: Diagnosis not present

## 2015-11-26 DIAGNOSIS — M5030 Other cervical disc degeneration, unspecified cervical region: Secondary | ICD-10-CM | POA: Diagnosis not present

## 2015-11-26 DIAGNOSIS — J439 Emphysema, unspecified: Secondary | ICD-10-CM | POA: Diagnosis not present

## 2015-11-26 DIAGNOSIS — M5136 Other intervertebral disc degeneration, lumbar region: Secondary | ICD-10-CM | POA: Diagnosis not present

## 2015-11-30 ENCOUNTER — Telehealth: Payer: Self-pay

## 2015-11-30 NOTE — Telephone Encounter (Signed)
pt called states she see you last week on the 14th. pt states the new medication remeron 15mg  she can not take.  she is having dizzyness,, shacking and her headahces are worse.  can she stop this medication

## 2015-11-30 NOTE — Telephone Encounter (Signed)
Stop the medication

## 2015-12-01 NOTE — Telephone Encounter (Signed)
pt was told that per dr. Garnetta Buddyfaheem order stop the medication.

## 2015-12-10 ENCOUNTER — Ambulatory Visit (INDEPENDENT_AMBULATORY_CARE_PROVIDER_SITE_OTHER): Payer: Medicare Other | Admitting: Psychiatry

## 2015-12-10 DIAGNOSIS — Z8659 Personal history of other mental and behavioral disorders: Secondary | ICD-10-CM

## 2015-12-10 DIAGNOSIS — F331 Major depressive disorder, recurrent, moderate: Secondary | ICD-10-CM | POA: Diagnosis not present

## 2015-12-10 MED ORDER — GABAPENTIN 100 MG PO CAPS: 100.0000 mg | ORAL_CAPSULE | Freq: Every day | ORAL | Status: DC

## 2015-12-10 MED ORDER — GABAPENTIN 100 MG PO CAPS: 100.0000 mg | ORAL_CAPSULE | Freq: Three times a day (TID) | ORAL | Status: DC

## 2015-12-10 MED ORDER — PROPRANOLOL HCL 10 MG PO TABS: 10.0000 mg | ORAL_TABLET | Freq: Three times a day (TID) | ORAL | Status: DC

## 2015-12-10 MED ORDER — ESCITALOPRAM OXALATE 5 MG PO TABS: 5.0000 mg | ORAL_TABLET | Freq: Every day | ORAL | Status: DC
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
## 2016-01-07 ENCOUNTER — Ambulatory Visit: Payer: Medicare Other | Admitting: Psychiatry

## 2016-02-06 ENCOUNTER — Encounter: Payer: Self-pay | Admitting: Emergency Medicine

## 2016-02-06 ENCOUNTER — Emergency Department: Payer: Medicare (Managed Care)

## 2016-02-06 ENCOUNTER — Observation Stay
Admission: EM | Admit: 2016-02-06 | Discharge: 2016-02-07 | Disposition: A | Discharge status: Home / Self Care | Payer: Medicare (Managed Care) | Attending: Internal Medicine | Admitting: Internal Medicine

## 2016-02-06 DIAGNOSIS — Z66 Do not resuscitate: Secondary | ICD-10-CM | POA: Insufficient documentation

## 2016-02-06 DIAGNOSIS — Z888 Allergy status to other drugs, medicaments and biological substances status: Secondary | ICD-10-CM | POA: Diagnosis not present

## 2016-02-06 DIAGNOSIS — J440 Chronic obstructive pulmonary disease with acute lower respiratory infection: Secondary | ICD-10-CM | POA: Insufficient documentation

## 2016-02-06 DIAGNOSIS — M542 Cervicalgia: Secondary | ICD-10-CM | POA: Insufficient documentation

## 2016-02-06 DIAGNOSIS — Z87892 Personal history of anaphylaxis: Secondary | ICD-10-CM | POA: Diagnosis not present

## 2016-02-06 DIAGNOSIS — F329 Major depressive disorder, single episode, unspecified: Secondary | ICD-10-CM | POA: Insufficient documentation

## 2016-02-06 DIAGNOSIS — I10 Essential (primary) hypertension: Secondary | ICD-10-CM | POA: Diagnosis not present

## 2016-02-06 DIAGNOSIS — Z79899 Other long term (current) drug therapy: Secondary | ICD-10-CM | POA: Diagnosis not present

## 2016-02-06 DIAGNOSIS — Z880 Allergy status to penicillin: Secondary | ICD-10-CM | POA: Diagnosis not present

## 2016-02-06 DIAGNOSIS — E876 Hypokalemia: Secondary | ICD-10-CM | POA: Insufficient documentation

## 2016-02-06 DIAGNOSIS — Z8701 Personal history of pneumonia (recurrent): Secondary | ICD-10-CM | POA: Diagnosis not present

## 2016-02-06 DIAGNOSIS — R51 Headache: Secondary | ICD-10-CM | POA: Insufficient documentation

## 2016-02-06 DIAGNOSIS — R079 Chest pain, unspecified: Secondary | ICD-10-CM | POA: Diagnosis present

## 2016-02-06 DIAGNOSIS — N189 Chronic kidney disease, unspecified: Secondary | ICD-10-CM | POA: Diagnosis not present

## 2016-02-06 DIAGNOSIS — D649 Anemia, unspecified: Secondary | ICD-10-CM | POA: Insufficient documentation

## 2016-02-06 DIAGNOSIS — E785 Hyperlipidemia, unspecified: Secondary | ICD-10-CM | POA: Diagnosis not present

## 2016-02-06 DIAGNOSIS — Z87891 Personal history of nicotine dependence: Secondary | ICD-10-CM | POA: Diagnosis not present

## 2016-02-06 DIAGNOSIS — I129 Hypertensive chronic kidney disease with stage 1 through stage 4 chronic kidney disease, or unspecified chronic kidney disease: Secondary | ICD-10-CM | POA: Diagnosis not present

## 2016-02-06 DIAGNOSIS — Z9889 Other specified postprocedural states: Secondary | ICD-10-CM | POA: Insufficient documentation

## 2016-02-06 DIAGNOSIS — Z90710 Acquired absence of both cervix and uterus: Secondary | ICD-10-CM | POA: Insufficient documentation

## 2016-02-06 DIAGNOSIS — Z7982 Long term (current) use of aspirin: Secondary | ICD-10-CM | POA: Diagnosis not present

## 2016-02-06 DIAGNOSIS — R0789 Other chest pain: Secondary | ICD-10-CM | POA: Diagnosis not present

## 2016-02-06 DIAGNOSIS — G629 Polyneuropathy, unspecified: Secondary | ICD-10-CM | POA: Insufficient documentation

## 2016-02-06 DIAGNOSIS — I700 Atherosclerosis of aorta: Secondary | ICD-10-CM | POA: Insufficient documentation

## 2016-02-06 DIAGNOSIS — I160 Hypertensive urgency: Secondary | ICD-10-CM | POA: Diagnosis present

## 2016-02-06 DIAGNOSIS — F419 Anxiety disorder, unspecified: Secondary | ICD-10-CM | POA: Insufficient documentation

## 2016-02-06 DIAGNOSIS — Z8249 Family history of ischemic heart disease and other diseases of the circulatory system: Secondary | ICD-10-CM | POA: Insufficient documentation

## 2016-02-06 DIAGNOSIS — Z820 Family history of epilepsy and other diseases of the nervous system: Secondary | ICD-10-CM | POA: Insufficient documentation

## 2016-02-06 DIAGNOSIS — Z883 Allergy status to other anti-infective agents status: Secondary | ICD-10-CM | POA: Diagnosis not present

## 2016-02-06 DIAGNOSIS — Z9882 Breast implant status: Secondary | ICD-10-CM | POA: Insufficient documentation

## 2016-02-06 DIAGNOSIS — J449 Chronic obstructive pulmonary disease, unspecified: Secondary | ICD-10-CM | POA: Diagnosis present

## 2016-02-06 DIAGNOSIS — K219 Gastro-esophageal reflux disease without esophagitis: Secondary | ICD-10-CM | POA: Diagnosis not present

## 2016-02-06 DIAGNOSIS — Z96653 Presence of artificial knee joint, bilateral: Secondary | ICD-10-CM | POA: Insufficient documentation

## 2016-02-06 DIAGNOSIS — M8580 Other specified disorders of bone density and structure, unspecified site: Secondary | ICD-10-CM | POA: Insufficient documentation

## 2016-02-06 DIAGNOSIS — Z818 Family history of other mental and behavioral disorders: Secondary | ICD-10-CM | POA: Insufficient documentation

## 2016-02-06 LAB — BASIC METABOLIC PANEL
Anion gap: 7 (ref 5–15)
BUN: 19 mg/dL (ref 6–20)
CALCIUM: 9.1 mg/dL (ref 8.9–10.3)
CO2: 28 mmol/L (ref 22–32)
CREATININE: 0.65 mg/dL (ref 0.44–1.00)
Chloride: 101 mmol/L (ref 101–111)
Glucose, Bld: 105 mg/dL — ABNORMAL HIGH (ref 65–99)
Potassium: 3 mmol/L — ABNORMAL LOW (ref 3.5–5.1)
SODIUM: 136 mmol/L (ref 135–145)

## 2016-02-06 LAB — CBC
HCT: 34.9 % — ABNORMAL LOW (ref 35.0–47.0)
Hemoglobin: 12.2 g/dL (ref 12.0–16.0)
MCH: 26.7 pg (ref 26.0–34.0)
MCHC: 34.9 g/dL (ref 32.0–36.0)
MCV: 76.5 fL — ABNORMAL LOW (ref 80.0–100.0)
PLATELETS: 334 10*3/uL (ref 150–440)
RBC: 4.57 MIL/uL (ref 3.80–5.20)
RDW: 17 % — ABNORMAL HIGH (ref 11.5–14.5)
WBC: 9.4 10*3/uL (ref 3.6–11.0)

## 2016-02-06 LAB — TROPONIN I

## 2016-02-06 MED ORDER — GABAPENTIN 400 MG PO CAPS
400.0000 mg | ORAL_CAPSULE | Freq: Once | ORAL | Status: AC
  Administered 2016-02-06: 400 mg via ORAL
  Filled 2016-02-06: qty 1

## 2016-02-06 MED ORDER — LABETALOL HCL 5 MG/ML IV SOLN
10.0000 mg | Freq: Once | INTRAVENOUS | Status: AC
  Administered 2016-02-06: 10 mg via INTRAVENOUS
  Filled 2016-02-06: qty 4

## 2016-02-06 MED ORDER — ACETAMINOPHEN 325 MG PO TABS
650.0000 mg | ORAL_TABLET | Freq: Once | ORAL | Status: AC
  Administered 2016-02-06: 650 mg via ORAL

## 2016-02-06 MED ORDER — ACETAMINOPHEN 325 MG PO TABS
ORAL_TABLET | ORAL | Status: AC
  Filled 2016-02-06: qty 2

## 2016-02-06 MED ORDER — OXYBUTYNIN CHLORIDE 5 MG PO TABS
5.0000 mg | ORAL_TABLET | Freq: Once | ORAL | Status: AC
  Administered 2016-02-07: 5 mg via ORAL
  Filled 2016-02-06: qty 1

## 2016-02-06 NOTE — ED Triage Notes (Signed)
PT to 5h via EMS, report left sided CP x 2 days, described as tight, radiating to back, reports SOB, nausea, and headache w/ dizziness/lightheadedness.  EMS gave 2" nitro paste and 324asa en route.  Pt HTN w/ ems SBP 210.  Pt NAD upon arrival, resp equal and unlabored, skin warm and dry.

## 2016-02-06 NOTE — ED Notes (Signed)
Patient transported to X-ray via stretcher 

## 2016-02-06 NOTE — H&P (Signed)
[facility] [place] [facility]   PATIENT NAME: Patricia Gomez    MR#:  [id number]  DATE OF BIRTH:  02/07/1946  DATE OF ADMISSION:  02/06/2016  PRIMARY CARE PHYSICIAN: Pcp Not In System   REQUESTING/REFERRING PHYSICIAN: Sharma Covert, MD  CHIEF COMPLAINT:   Chief Complaint  Patient presents with  . Chest Pain    HISTORY OF PRESENT ILLNESS:  Patricia Gomez  is a 70 y.o. female who presents with Chest pain in the setting of uncontrolled hypertension. Patient's blood pressure was systolic 200s. She received medications in the ED which controlled her blood pressure well, but she had somewhat of persistent chest pain. Patient states she's had this chest pain for a long time intermittently. She has no prior history of CAD. Hospitals were called for admission for further workup as her chest pain was not resolved despite improvement in her blood pressure.  PAST MEDICAL HISTORY:   Past Medical History:  Diagnosis Date  . Anemia   . Anxiety   . Asthma   . Chronic kidney disease   . COPD (chronic obstructive pulmonary disease) (HCC)   . Depression   . DJD (degenerative joint disease) of knee   . GERD (gastroesophageal reflux disease)   . Hx of migraines   . Hyperlipidemia   . Hypertension   . Incontinence   . Neuropathy (HCC)   . Osteopenia   . Panic attack   . Shortness of breath dyspnea   . Wheezing     PAST SURGICAL HISTORY:   Past Surgical History:  Procedure Laterality Date  . ABDOMINAL HYSTERECTOMY    . BREAST ENHANCEMENT SURGERY    . CARPOMETACARPAL (CMC) FUSION OF THUMB Left 05/21/2015   Procedure: CARPOMETACARPAL (CMC) FUSION OF THUMB;  Surgeon: Christena Flake, MD;  Location: [facility] ORS;  Service: Orthopedics;  Laterality: Left;  . EYE SURGERY Bilateral 2013  . GANGLION CYST EXCISION Left 05/21/2015   Procedure: REMOVAL GANGLION OF WRIST;  Surgeon: Christena Flake, MD;  Location: [facility] ORS;  Service: Orthopedics;  Laterality: Left;  . INTRAOCULAR LENS  INSERTION Bilateral   . JOINT REPLACEMENT     bilateral knees  . OOPHORECTOMY     with tumor removed before hysterectomy  . REPLACEMENT TOTAL KNEE BILATERAL Bilateral 2011  . TUBAL LIGATION      SOCIAL HISTORY:   Social History  Substance Use Topics  . Smoking status: Former Smoker    Quit date: 10/20/2008  . Smokeless tobacco: Never Used  . Alcohol use No    FAMILY HISTORY:   Family History  Problem Relation Age of Onset  . Muscular dystrophy Sister   . Heart disease Sister   . Mental illness Brother   . Depression Brother   . Heart disease Mother   . Mental illness Mother   . Heart attack Father     DRUG ALLERGIES:   Allergies  Allergen Reactions  . Penicillins Anaphylaxis, Rash and Other (See Comments)    Has patient had a PCN reaction causing immediate rash, facial/tongue/throat swelling, SOB or lightheadedness with hypotension: Yes Has patient had a PCN reaction causing severe rash involving mucus membranes or skin necrosis: No Has patient had a PCN reaction that required hospitalization No Has patient had a PCN reaction occurring within the last 10 years: No If all of the above answers are "NO", then may proceed with Cephalosporin use.  . Flagyl [Metronidazole] Nausea And Vomiting  . Prednisone Diarrhea and Nausea And Vomiting  MEDICATIONS AT HOME:   Prior to Admission medications   Medication Sig Start Date End Date Taking? Authorizing Provider  albuterol (PROVENTIL HFA;VENTOLIN HFA) 108 (90 Base) MCG/ACT inhaler Inhale 2 puffs into the lungs every 6 (six) hours as needed for wheezing or shortness of breath. 09/26/15  Yes Governor Rooksebecca Lord, MD  amLODipine (NORVASC) 2.5 MG tablet Take 2.5 mg by mouth daily.   Yes Historical Provider, MD  aspirin EC 81 MG tablet Take 81 mg by mouth daily.   Yes Historical Provider, MD  ergocalciferol (VITAMIN D2) 50000 units capsule Take 50,000 Units by mouth once a week. Patient takes on Wednesday   Yes Historical Provider, MD   ferrous sulfate 325 (65 FE) MG tablet Take 325 mg by mouth daily with breakfast.   Yes Historical Provider, MD  gabapentin (NEURONTIN) 100 MG capsule Take 200-400 mg by mouth 2 (two) times daily. 200mg  in the morning and 400mg  at bedtime   Yes Historical Provider, MD  omeprazole (PRILOSEC) 20 MG capsule Take 20 mg by mouth daily.   Yes Historical Provider, MD  oxybutynin (DITROPAN-XL) 5 MG 24 hr tablet Take 5 mg by mouth at bedtime.   Yes Historical Provider, MD  propranolol (INDERAL) 10 MG tablet Take 1 tablet (10 mg total) by mouth 3 (three) times daily. Patient taking differently: Take 10 mg by mouth daily.  12/10/15  Yes Brandy HaleUzma Faheem, MD    REVIEW OF SYSTEMS:  Review of Systems  Constitutional: Negative for chills, fever, malaise/fatigue and weight loss.  HENT: Negative for ear pain, hearing loss and tinnitus.   Eyes: Negative for blurred vision, double vision, pain and redness.  Respiratory: Negative for cough, hemoptysis and shortness of breath.   Cardiovascular: Positive for chest pain. Negative for palpitations, orthopnea and leg swelling.  Gastrointestinal: Negative for abdominal pain, constipation, diarrhea, nausea and vomiting.  Genitourinary: Negative for dysuria, frequency and hematuria.  Musculoskeletal: Negative for back pain, joint pain and neck pain.  Skin:       No acne, rash, or lesions  Neurological: Negative for dizziness, tremors, focal weakness and weakness.  Endo/Heme/Allergies: Negative for polydipsia. Does not bruise/bleed easily.  Psychiatric/Behavioral: Negative for depression. The patient is not nervous/anxious and does not have insomnia.      VITAL SIGNS:   Vitals:   02/06/16 2240 02/06/16 2250 02/06/16 2300 02/06/16 2330  BP: (!) 123/53 121/66 138/64 (!) 149/76  Pulse: 65 80 67 62  Resp: 17  (!) 23 (!) 22  Temp:      TempSrc:      SpO2: 92% 94% 94% 96%  Weight:      Height:       Wt Readings from Last 3 Encounters:  02/06/16 83.9 kg (185 lb)   11/25/15 83.8 kg (184 lb 12.8 oz)  11/19/15 82.6 kg (182 lb)    PHYSICAL EXAMINATION:  Physical Exam  Vitals reviewed. Constitutional: She is oriented to person, place, and time. She appears well-developed and well-nourished. No distress.  HENT:  Head: Normocephalic and atraumatic.  Mouth/Throat: Oropharynx is clear and moist.  Eyes: Conjunctivae and EOM are normal. Pupils are equal, round, and reactive to light. No scleral icterus.  Neck: Normal range of motion. Neck supple. No JVD present. No thyromegaly present.  Cardiovascular: Normal rate, regular rhythm and intact distal pulses.  Exam reveals no gallop and no friction rub.   No murmur heard. Respiratory: Effort normal and breath sounds normal. No respiratory distress. She has no wheezes. She has no rales.  GI: Soft. Bowel sounds are normal. She exhibits no distension. There is no tenderness.  Musculoskeletal: Normal range of motion. She exhibits no edema.  No arthritis, no gout  Lymphadenopathy:    She has no cervical adenopathy.  Neurological: She is alert and oriented to person, place, and time. No cranial nerve deficit.  No dysarthria, no aphasia  Skin: Skin is warm and dry. No rash noted. No erythema.  Psychiatric: She has a normal mood and affect. Her behavior is normal. Judgment and thought content normal.    LABORATORY PANEL:   CBC  Recent Labs Lab 02/06/16 2020  WBC 9.4  HGB 12.2  HCT 34.9*  PLT 334   ------------------------------------------------------------------------------------------------------------------  Chemistries   Recent Labs Lab 02/06/16 2020  NA 136  K 3.0*  CL 101  CO2 28  GLUCOSE 105*  BUN 19  CREATININE 0.65  CALCIUM 9.1   ------------------------------------------------------------------------------------------------------------------  Cardiac Enzymes  Recent Labs Lab 02/06/16 2020  TROPONINI <0.03    ------------------------------------------------------------------------------------------------------------------  RADIOLOGY:  Dg Chest 2 View  Result Date: 02/06/2016 CLINICAL DATA:  Left-sided chest pain for 2 days. Shortness of breath, nausea and headache. Dizziness and lightheadedness. EXAM: CHEST  2 VIEW COMPARISON:  11/01/2015 FINDINGS: Mild chronic hyperinflation and bronchitic changes are unchanged. The cardiomediastinal contours are normal. There is atherosclerosis of the thoracic aorta. Pulmonary vasculature is normal. No consolidation, pleural effusion, or pneumothorax. No acute osseous abnormalities are seen. IMPRESSION: Stable mild chronic hyperinflation and bronchitic change. No superimposed acute abnormality. Thoracic aortic atherosclerosis. Electronically Signed   By: Rubye Oaks M.D.   On: 02/06/2016 21:39    EKG:   Orders placed or performed during the hospital encounter of 02/06/16  . EKG 12-Lead  . EKG 12-Lead  . ED EKG within 10 minutes  . ED EKG within 10 minutes    IMPRESSION AND PLAN:  Principal Problem:   Chest pain - Unclear etiology. We will trend her cardiac enzymes tonight, monitor her on telemetry and get an echocardiogram in the morning. Based on these results might consider cardiology consult if symptoms have not improved or there are any significant abnormalities found Active Problems:   Uncontrolled hypertension - continue blood pressure control here, goal is less than 160/100   COPD (chronic obstructive pulmonary disease) (HCC) - continue home inhalers   Hyperlipidemia - continue home meds   GERD (gastroesophageal reflux disease) - home dose PPI  All the records are reviewed and case discussed with ED provider. Management plans discussed with the patient and/or family.  DVT PROPHYLAXIS: SubQ lovenox  GI PROPHYLAXIS: PPI  ADMISSION STATUS: Observation  CODE STATUS: Full Code Status History    Date Active Date Inactive Code Status Order  ID Comments User Context   09/15/2015 11:30 PM 09/17/2015  5:11 PM Full Code [id number]  Shaune Pollack, MD Inpatient    Advance Directive Documentation   Flowsheet Row Most Recent Value  Type of Advance Directive  Healthcare Power of Attorney  Pre-existing out of facility DNR order (yellow form or pink MOST form)  No data  "MOST" Form in Place?  No data      TOTAL TIME TAKING CARE OF THIS PATIENT: 40 minutes.    Irasema Chalk FIELDING 02/06/2016, 11:47 PM  [facility]  Office  [phone]  CC: Primary care physician; Pcp Not In System

## 2016-02-06 NOTE — ED Notes (Signed)
Pt moved from [facility]H to treatment room 6; asst x 1 to BR to void; will attempt to collect urine specimen if one should be required

## 2016-02-06 NOTE — ED Provider Notes (Signed)
[facility] Emergency Department Provider Note  ____________________________________________  Time seen: Approximately 9:22 PM  I have reviewed the triage vital signs and the nursing notes.   HISTORY  Chief Complaint Chest Pain    HPI Patricia Gomez is a 70 y.o. female with a history of HTN, HL, and COPD presenting with chest pain. The patient reports that she was at rest Thursday evening when she developed a "fluttering" sensation in the center of her chest associated with chest "tightness" that was associated with shortness of breath and lightheadedness.  Sometimes she has also had a headache and blurred vision with this.. Since then, the patient has had persistent symptoms although the wax and wane. She does not know of anything that can make the pain better or worse. She does not associate this pain with any food. No lower extremity swelling, calf pain, or recent illness. The patient does describe that recently her blood pressure has been difficult to control and that she has had multiple medication changes over the last month with her primary care physician. This morning, her blood pressure was in the 240s at home.  The patient has never had a cardiac catheterization, and her last stress test was years ago.  Past Medical History:  Diagnosis Date  . Anemia   . Anxiety   . Asthma   . Chronic kidney disease   . COPD (chronic obstructive pulmonary disease) (HCC)   . Depression   . DJD (degenerative joint disease) of knee   . GERD (gastroesophageal reflux disease)   . Hx of migraines   . Hyperlipidemia   . Hypertension   . Incontinence   . Neuropathy (HCC)   . Osteopenia   . Panic attack   . Shortness of breath dyspnea   . Wheezing     Patient Active Problem List   Diagnosis Date Noted  . Pneumonia 09/15/2015  . Anxiety 08/20/2015  . Ganglion cyst of wrist 05/21/2015  . Tendinitis of wrist 05/21/2015  . Asthma 05/06/2015  . CMC arthritis,  thumb, degenerative 05/01/2015  . Anemia   . Hyperlipidemia   . Depression   . Osteopenia   . Essential hypertension   . Chronic kidney disease   . Neuropathy (HCC)   . DJD (degenerative joint disease) of knee   . Failed back surgical syndrome   . Vaginal atrophy 12/16/2014  . DDD (degenerative disc disease), cervical 10/21/2014  . DDD (degenerative disc disease), lumbar 10/21/2014  . Facet syndrome 10/21/2014  . Degenerative joint disease of sacroiliac joint 10/21/2014  . Bilateral occipital neuralgia 10/21/2014    Past Surgical History:  Procedure Laterality Date  . ABDOMINAL HYSTERECTOMY    . BREAST ENHANCEMENT SURGERY    . CARPOMETACARPAL (CMC) FUSION OF THUMB Left 05/21/2015   Procedure: CARPOMETACARPAL (CMC) FUSION OF THUMB;  Surgeon: Christena Flake, MD;  Location: [facility] ORS;  Service: Orthopedics;  Laterality: Left;  . EYE SURGERY Bilateral 2013  . GANGLION CYST EXCISION Left 05/21/2015   Procedure: REMOVAL GANGLION OF WRIST;  Surgeon: Christena Flake, MD;  Location: [facility] ORS;  Service: Orthopedics;  Laterality: Left;  . INTRAOCULAR LENS INSERTION Bilateral   . JOINT REPLACEMENT     bilateral knees  . OOPHORECTOMY     with tumor removed before hysterectomy  . REPLACEMENT TOTAL KNEE BILATERAL Bilateral 2011  . TUBAL LIGATION      Current Outpatient Rx  . Order #: [id number] Class: Historical Med  . Order #: [id number] Class: Historical Med  .  Order #: [id number] Class: Historical Med  . Order #: [id number] Class: Print  . Order #: [id number] Class: Historical Med  . Order #: [id number] Class: Historical Med  . Order #: [id number] Class: Normal  . Order #: [id number] Class: Historical Med  . Order #: [id number] Class: Historical Med  . Order #: [id number] Class: Normal  . Order #: [id number] Class: Historical Med  . Order #: [id number] Class: No Print  . Order #: [id number] Class: Normal    Allergies Penicillins; Flagyl [metronidazole]; and Prednisone  Family History  Problem Relation Age of  Onset  . Muscular dystrophy Sister   . Heart disease Sister   . Mental illness Brother   . Depression Brother   . Heart disease Mother   . Mental illness Mother   . Heart attack Father     Social History Social History  Substance Use Topics  . Smoking status: Former Smoker    Quit date: 10/20/2008  . Smokeless tobacco: Never Used  . Alcohol use No    Review of Systems Constitutional: No fever/chills.Positive lightheadedness. No syncope. Positive hypertension Eyes: Blurred vision. ENT: No sore throat. No congestion or rhinorrhea. Cardiovascular: Positive chest pain. Positive palpitations. Respiratory: Positive shortness of breath.  No cough. Gastrointestinal: No abdominal pain.  No nausea, no vomiting.  No diarrhea.  No constipation. Genitourinary: Negative for dysuria. Musculoskeletal: Negative for back pain. Skin: Negative for rash. Neurological: Positive for headaches. No focal numbness, tingling or weakness.   10-point ROS otherwise negative.  ____________________________________________   PHYSICAL EXAM:  VITAL SIGNS: ED Triage Vitals  Enc Vitals Group     BP 02/06/16 2016 (!) 195/73     Pulse Rate 02/06/16 2016 81     Resp 02/06/16 2016 18     Temp 02/06/16 2016 98.3 F (36.8 C)     Temp Source 02/06/16 2016 Oral     SpO2 02/06/16 2016 94 %     Weight 02/06/16 2017 185 lb (83.9 kg)     Height 02/06/16 2017 5\' 3"  (1.6 m)     Head Circumference --      Peak Flow --      Pain Score 02/06/16 2017 5     Pain Loc --      Pain Edu? --      Excl. in GC? --     Constitutional: Alert and oriented. Well appearing and in no acute distress. Answers questions appropriately. Eyes: Conjunctivae are normal.  EOMI. No scleral icterus. Head: Atraumatic. Nose: No congestion/rhinnorhea. Mouth/Throat: Mucous membranes are moist.  Neck: No stridor.  Supple.  No JVD. Cardiovascular: Normal rate, regular rhythm. No murmurs, rubs or gallops.  Respiratory: Normal respiratory  effort.  No accessory muscle use or retractions. Lungs CTAB.  No wheezes, rales or ronchi. Gastrointestinal: Soft, nontender and nondistended.  No guarding or rebound.  No peritoneal signs. Musculoskeletal: No LE edema. No ttp in the calves or palpable cords.  Negative Homan's sign. Neurologic:  A&Ox3.  Speech is clear.  Face and smile are symmetric.  EOMI.  Moves all extremities well. Skin:  Skin is warm, dry and intact. No rash noted. Psychiatric: Mood and affect are normal. Speech and behavior are normal.  Normal judgement.  ____________________________________________   LABS (all labs ordered are listed, but only abnormal results are displayed)  Labs Reviewed  CBC - Abnormal; Notable for the following:       Result Value   HCT 34.9 (*)    MCV 76.5 (*)    RDW 17.0 (*)    All other components  within normal limits  BASIC METABOLIC PANEL  TROPONIN I   ____________________________________________  EKG  ED ECG REPORT I, Rockne Menghini, the attending physician, personally viewed and interpreted this ECG.   Date: 02/06/2016  EKG Time: 2015  Rate: 72  Rhythm: normal sinus rhythm  Axis: Leftward  Intervals:none  ST&T Change: No ST elevation.  ____________________________________________  RADIOLOGY  No results found.  ____________________________________________   PROCEDURES  Procedure(s) performed: None  Procedures  Critical Care performed: No ____________________________________________   INITIAL IMPRESSION / ASSESSMENT AND PLAN / ED COURSE  Pertinent labs & imaging results that were available during my care of the patient were reviewed by me and considered in my medical decision making (see chart for details).  70 y.o. female with hypertension, hyperlipidemia and COPD presenting with several days of chest pain associated with shortness of breath and lightheadedness.  Given the patient's symptoms, and concerned about possible cardiac causes for her pain,  including ACS or MI. In the emergency department, she does not have ischemic changes on her EKG, her troponin is negative, but she does have a significantly elevated blood pressure. I will treat her with metoprolol, and plan to admit her to the hospital for further treatment and evaluation. She did receive 4 baby aspirin by EMS, and is currently completely asymptomatic.  ____________________________________________  FINAL CLINICAL IMPRESSION(S) / ED DIAGNOSES  Final diagnoses:  None    Clinical Course      NEW MEDICATIONS STARTED DURING THIS VISIT:  New Prescriptions   No medications on file      Rockne Menghini, MD 02/06/16 2129

## 2016-02-06 NOTE — ED Notes (Signed)
Pt assisted back to bed; urine specimen obtained; pt c/o urinary frequency; will ask MD about sending for evaluation; pt short of breath when returned to bed; oxygen 96% on room air;

## 2016-02-07 ENCOUNTER — Observation Stay
Admit: 2016-02-07 | Discharge: 2016-02-07 | Disposition: A | Discharge status: Home / Self Care | Payer: Medicare (Managed Care) | Attending: Internal Medicine | Admitting: Internal Medicine

## 2016-02-07 LAB — CBC
HEMATOCRIT: 31.7 % — AB (ref 35.0–47.0)
HEMOGLOBIN: 11.1 g/dL — AB (ref 12.0–16.0)
MCH: 26.7 pg (ref 26.0–34.0)
MCHC: 34.9 g/dL (ref 32.0–36.0)
MCV: 76.7 fL — ABNORMAL LOW (ref 80.0–100.0)
PLATELETS: 293 10*3/uL (ref 150–440)
RBC: 4.13 MIL/uL (ref 3.80–5.20)
RDW: 16.7 % — ABNORMAL HIGH (ref 11.5–14.5)
WBC: 8.3 10*3/uL (ref 3.6–11.0)

## 2016-02-07 LAB — BASIC METABOLIC PANEL
ANION GAP: 8 (ref 5–15)
BUN: 20 mg/dL (ref 6–20)
CHLORIDE: 101 mmol/L (ref 101–111)
CO2: 28 mmol/L (ref 22–32)
CREATININE: 0.82 mg/dL (ref 0.44–1.00)
Calcium: 8.7 mg/dL — ABNORMAL LOW (ref 8.9–10.3)
GFR calc non Af Amer: >60 mL/min (ref >60)
Glucose, Bld: 106 mg/dL — ABNORMAL HIGH (ref 65–99)
POTASSIUM: 3 mmol/L — AB (ref 3.5–5.1)
SODIUM: 137 mmol/L (ref 135–145)

## 2016-02-07 LAB — MAGNESIUM: Magnesium: 1.9 mg/dL (ref 1.7–2.4)

## 2016-02-07 LAB — TROPONIN I
Troponin I: <0.03 ng/mL (ref <0.03)
Troponin I: <0.03 ng/mL (ref <0.03)
Troponin I: <0.03 ng/mL (ref <0.03)

## 2016-02-07 MED ORDER — ALUM & MAG HYDROXIDE-SIMETH 200-200-20 MG/5ML PO SUSP: 30.0000 mL | Freq: Four times a day (QID) | ORAL | Status: DC | PRN

## 2016-02-07 MED ORDER — GABAPENTIN 400 MG PO CAPS: 400.0000 mg | ORAL_CAPSULE | Freq: Every day | ORAL | Status: DC

## 2016-02-07 MED ORDER — IBUPROFEN 400 MG PO TABS: 400.0000 mg | ORAL_TABLET | Freq: Four times a day (QID) | ORAL | Status: DC | PRN

## 2016-02-07 MED ORDER — ASPIRIN EC 81 MG PO TBEC
81.0000 mg | DELAYED_RELEASE_TABLET | Freq: Every day | ORAL | Status: DC
  Administered 2016-02-07: 81 mg via ORAL
  Filled 2016-02-07: qty 1

## 2016-02-07 MED ORDER — IBUPROFEN 400 MG PO TABS: 400.0000 mg | ORAL_TABLET | Freq: Four times a day (QID) | ORAL | 0 refills | Status: DC | PRN

## 2016-02-07 MED ORDER — ALBUTEROL SULFATE (2.5 MG/3ML) 0.083% IN NEBU: 3.0000 mL | INHALATION_SOLUTION | Freq: Four times a day (QID) | RESPIRATORY_TRACT | Status: DC | PRN

## 2016-02-07 MED ORDER — PROPRANOLOL HCL 20 MG PO TABS
10.0000 mg | ORAL_TABLET | Freq: Every day | ORAL | Status: DC
  Filled 2016-02-07: qty 1

## 2016-02-07 MED ORDER — ACETAMINOPHEN 650 MG RE SUPP: 650.0000 mg | Freq: Four times a day (QID) | RECTAL | Status: DC | PRN

## 2016-02-07 MED ORDER — ALUM & MAG HYDROXIDE-SIMETH 200-200-20 MG/5ML PO SUSP: 30.0000 mL | Freq: Four times a day (QID) | ORAL | 0 refills | Status: DC | PRN

## 2016-02-07 MED ORDER — PANTOPRAZOLE SODIUM 40 MG PO TBEC
40.0000 mg | DELAYED_RELEASE_TABLET | Freq: Every day | ORAL | Status: DC
  Administered 2016-02-07: 40 mg via ORAL
  Filled 2016-02-07: qty 1

## 2016-02-07 MED ORDER — ONDANSETRON HCL 4 MG PO TABS: 4.0000 mg | ORAL_TABLET | Freq: Four times a day (QID) | ORAL | Status: DC | PRN

## 2016-02-07 MED ORDER — GABAPENTIN 100 MG PO CAPS: 200.0000 mg | ORAL_CAPSULE | Freq: Two times a day (BID) | ORAL | Status: DC

## 2016-02-07 MED ORDER — ONDANSETRON HCL 4 MG/2ML IJ SOLN
4.0000 mg | Freq: Four times a day (QID) | INTRAMUSCULAR | Status: DC | PRN
Start: 2016-02-07 — End: 2016-02-07

## 2016-02-07 MED ORDER — AMLODIPINE BESYLATE 5 MG PO TABS
2.5000 mg | ORAL_TABLET | Freq: Every day | ORAL | Status: DC
  Filled 2016-02-07: qty 1

## 2016-02-07 MED ORDER — POTASSIUM CHLORIDE CRYS ER 20 MEQ PO TBCR
60.0000 meq | EXTENDED_RELEASE_TABLET | Freq: Once | ORAL | Status: AC
  Administered 2016-02-07: 60 meq via ORAL
  Filled 2016-02-07: qty 3

## 2016-02-07 MED ORDER — ACETAMINOPHEN 325 MG PO TABS
650.0000 mg | ORAL_TABLET | Freq: Four times a day (QID) | ORAL | Status: DC | PRN
  Administered 2016-02-07 (×2): 650 mg via ORAL
  Filled 2016-02-07 (×2): qty 2

## 2016-02-07 MED ORDER — ENOXAPARIN SODIUM 40 MG/0.4ML ~~LOC~~ SOLN: 40.0000 mg | SUBCUTANEOUS | Status: DC

## 2016-02-07 MED ORDER — GABAPENTIN 100 MG PO CAPS
200.0000 mg | ORAL_CAPSULE | Freq: Every morning | ORAL | Status: DC
  Administered 2016-02-07: 200 mg via ORAL
  Filled 2016-02-07: qty 2

## 2016-02-07 MED ORDER — OXYCODONE-ACETAMINOPHEN 5-325 MG PO TABS
1.0000 | ORAL_TABLET | Freq: Four times a day (QID) | ORAL | Status: DC | PRN
  Administered 2016-02-07: 1 via ORAL
  Filled 2016-02-07: qty 1

## 2016-02-07 MED ORDER — SODIUM CHLORIDE 0.9% FLUSH
3.0000 mL | Freq: Two times a day (BID) | INTRAVENOUS | Status: DC
  Administered 2016-02-07 (×2): 3 mL via INTRAVENOUS

## 2016-02-07 MED ORDER — HYDRALAZINE HCL 20 MG/ML IJ SOLN: 10.0000 mg | INTRAMUSCULAR | Status: DC | PRN

## 2016-02-07 MED ORDER — OXYBUTYNIN CHLORIDE ER 5 MG PO TB24
5.0000 mg | ORAL_TABLET | Freq: Every day | ORAL | Status: DC
  Filled 2016-02-07: qty 1

## 2016-02-07 NOTE — Care Management Obs Status (Signed)
MEDICARE OBSERVATION STATUS NOTIFICATION   Patient Details  Name: Patricia Gomez MRN: [id number] Date of Birth: 09/21/1945   Medicare Observation Status Notification Given:  No (discharge order in less than 24 hours)    Caren MacadamMichelle Mandee Pluta, RN 02/07/2016, 2:05 PM

## 2016-02-07 NOTE — Progress Notes (Addendum)
Pt has 8/10 headache despite having received PO tylenol. Dr. Imogene Burnhen notified and he stated he will look at medication orders and add another pain medication to address headache. Pt's BP is low at 117/44. Per Dr. Imogene Burnhen I will hold her antihypertensives and recheck BP in about 1 hour. If SBP>/= to 110 and DBP =/> 60 I may administer pt's BP meds. I also spoke to Dr. Imogene Burnhen about order in epic for pt to be on continuous pulse ox, he stated pt did not require continuous pulse ox at this time. Order D/C'd in epic.   Update 1009: Pt's DBP still low (124/44). Will continue to hold her propanolol and amlodipine for now. Will recheck BP later this AM.

## 2016-02-07 NOTE — Progress Notes (Signed)
Patient is to be discharged home today. Patient is in no acute distress at this time, and assessment is unchanged from this morning. Patient's IV is out, discharge paperwork has been discussed with patient/family and there are no questions at this time. Pt reports that she has a BP cuff that she is able to use at home. I told her to check her BP at home once she gets home as well as before going to bed. If it begins to become elevated she needs to call the on call doctor at her office. Patient states she has her follow up appointment with her PCP scheduled for tomorrow. Patient will be accompanied downstairs by staff and family via wheelchair.

## 2016-02-07 NOTE — Discharge Summary (Signed)
[employer] - [place] at [facility]   PATIENT NAME: Patricia Gomez    MR#:  [id number]  DATE OF BIRTH:  04/12/1946  DATE OF ADMISSION:  02/06/2016   ADMITTING PHYSICIAN: Oralia Manisavid Willis, MD  DATE OF DISCHARGE: 02/07/2016  PRIMARY CARE PHYSICIAN: Pcp Not In System   ADMISSION DIAGNOSIS:  Hypertensive urgency [I16.0] Chest pain, unspecified chest pain type [R07.9] DISCHARGE DIAGNOSIS:  Principal Problem:   Chest pain Active Problems:   Hyperlipidemia   Uncontrolled hypertension   COPD (chronic obstructive pulmonary disease) (HCC)   GERD (gastroesophageal reflux disease) hypokalemia. SECONDARY DIAGNOSIS:   Past Medical History:  Diagnosis Date  . Anemia   . Anxiety   . Asthma   . Chronic kidney disease   . COPD (chronic obstructive pulmonary disease) (HCC)   . Depression   . DJD (degenerative joint disease) of knee   . GERD (gastroesophageal reflux disease)   . Hx of migraines   . Hyperlipidemia   . Hypertension   . Incontinence   . Neuropathy (HCC)   . Osteopenia   . Panic attack   . Shortness of breath dyspnea   . Wheezing    HOSPITAL COURSE:  Chest pain, heart burning due to GERD. Continue ASA. Normal troponin, f/u echocardiogram report as outpatient.  Hypertension, BP is low, hold norvac and propranolol today.    COPD (chronic obstructive pulmonary disease) (HCC) - continue home inhalers   Hyperlipidemia - continue home meds   GERD (gastroesophageal reflux disease) - home dose PPI, add maalox prn.  Headache and neck pain, ibuprofen prn. Hypokalemia. Given po KCl. F/u BMP as outpatient. DISCHARGE CONDITIONS:  Stable, discharge to home today. CONSULTS OBTAINED:   DRUG ALLERGIES:   Allergies  Allergen Reactions  . Penicillins Anaphylaxis, Rash and Other (See Comments)    Has patient had a PCN reaction causing immediate rash, facial/tongue/throat swelling, SOB or lightheadedness with hypotension: Yes Has patient had a PCN reaction  causing severe rash involving mucus membranes or skin necrosis: No Has patient had a PCN reaction that required hospitalization No Has patient had a PCN reaction occurring within the last 10 years: No If all of the above answers are "NO", then may proceed with Cephalosporin use.  . Flagyl [Metronidazole] Nausea And Vomiting  . Prednisone Diarrhea and Nausea And Vomiting   DISCHARGE MEDICATIONS:     Medication List    TAKE these medications   albuterol 108 (90 Base) MCG/ACT inhaler Commonly known as:  PROVENTIL HFA;VENTOLIN HFA Inhale 2 puffs into the lungs every 6 (six) hours as needed for wheezing or shortness of breath.   alum & mag hydroxide-simeth 200-200-20 MG/5ML suspension Commonly known as:  MAALOX/MYLANTA Take 30 mLs by mouth every 6 (six) hours as needed for indigestion or heartburn.   amLODipine 2.5 MG tablet Commonly known as:  NORVASC Take 2.5 mg by mouth daily.   aspirin EC 81 MG tablet Take 81 mg by mouth daily.   ergocalciferol 50000 units capsule Commonly known as:  VITAMIN D2 Take 50,000 Units by mouth once a week. Patient takes on Wednesday   ferrous sulfate 325 (65 FE) MG tablet Take 325 mg by mouth daily with breakfast.   gabapentin 100 MG capsule Commonly known as:  NEURONTIN Take 200-400 mg by mouth 2 (two) times daily. 200mg  in the morning and 400mg  at bedtime   ibuprofen 400 MG tablet Commonly known as:  ADVIL,MOTRIN Take 1 tablet (400 mg total) by mouth every 6 (six) hours as needed  for headache, mild pain or moderate pain.   omeprazole 20 MG capsule Commonly known as:  PRILOSEC Take 20 mg by mouth daily.   oxybutynin 5 MG 24 hr tablet Commonly known as:  DITROPAN-XL Take 5 mg by mouth at bedtime.   propranolol 10 MG tablet Commonly known as:  INDERAL Take 1 tablet (10 mg total) by mouth 3 (three) times daily. What changed:  when to take this        DISCHARGE INSTRUCTIONS:   DIET:  Heart healthy diet. DISCHARGE CONDITION:    Stable. ACTIVITY:  As tolerated. DISCHARGE LOCATION:    If you experience worsening of your admission symptoms, develop shortness of breath, life threatening emergency, suicidal or homicidal thoughts you must seek medical attention immediately by calling 911 or calling your MD immediately  if symptoms less severe.  You Must read complete instructions/literature along with all the possible adverse reactions/side effects for all the Medicines you take and that have been prescribed to you. Take any new Medicines after you have completely understood and accpet all the possible adverse reactions/side effects.   Please note  You were cared for by a hospitalist during your hospital stay. If you have any questions about your discharge medications or the care you received while you were in the hospital after you are discharged, you can call the unit and asked to speak with the hospitalist on call if the hospitalist that took care of you is not available. Once you are discharged, your primary care physician will handle any further medical issues. Please note that NO REFILLS for any discharge medications will be authorized once you are discharged, as it is imperative that you return to your primary care physician (or establish a relationship with a primary care physician if you do not have one) for your aftercare needs so that they can reassess your need for medications and monitor your lab values.    On the day of Discharge:  VITAL SIGNS:  Blood pressure (!) 145/56, pulse 60, temperature 98.4 F (36.9 C), temperature source Oral, resp. rate 18, height 5\' 3"  (1.6 m), weight 187 lb 1.6 oz (84.9 kg), SpO2 96 %. PHYSICAL EXAMINATION:  GENERAL:  70 y.o.-year-old patient lying in the bed with no acute distress. Obese. EYES: Pupils equal, round, reactive to light and accommodation. No scleral icterus. Extraocular muscles intact.  HEENT: Head atraumatic, normocephalic. Oropharynx and nasopharynx clear.   NECK:  Supple, no jugular venous distention. No thyroid enlargement, no tenderness.  LUNGS: Normal breath sounds bilaterally, no wheezing, rales,rhonchi or crepitation. No use of accessory muscles of respiration.  CARDIOVASCULAR: S1, S2 normal. No murmurs, rubs, or gallops. Chest wall tenderness on palpation. ABDOMEN: Soft, non-tender, non-distended. Bowel sounds present. No organomegaly or mass.  EXTREMITIES: No pedal edema, cyanosis, or clubbing.  NEUROLOGIC: Cranial nerves II through XII are intact. Muscle strength 5/5 in all extremities. Sensation intact. Gait not checked.  PSYCHIATRIC: The patient is alert and oriented x 3.  SKIN: No obvious rash, lesion, or ulcer.  DATA REVIEW:   CBC  Recent Labs Lab 02/07/16 0219  WBC 8.3  HGB 11.1*  HCT 31.7*  PLT 293    Chemistries   Recent Labs Lab 02/07/16 0219 02/07/16 0757  NA 137  --   K 3.0*  --   CL 101  --   CO2 28  --   GLUCOSE 106*  --   BUN 20  --   CREATININE 0.82  --   CALCIUM 8.7*  --  MG  --  1.9     Microbiology Results  Results for orders placed or performed during the hospital encounter of 10/13/15  Urine culture     Status: Abnormal   Collection Time: 10/13/15  1:22 PM  Result Value Ref Range Status   Specimen Description URINE, RANDOM  Final   Special Requests NONE  Final   Culture MULTIPLE SPECIES PRESENT, SUGGEST RECOLLECTION (A)  Final   Report Status 10/15/2015 FINAL  Final    RADIOLOGY:  Dg Chest 2 View  Result Date: 02/06/2016 CLINICAL DATA:  Left-sided chest pain for 2 days. Shortness of breath, nausea and headache. Dizziness and lightheadedness. EXAM: CHEST  2 VIEW COMPARISON:  11/01/2015 FINDINGS: Mild chronic hyperinflation and bronchitic changes are unchanged. The cardiomediastinal contours are normal. There is atherosclerosis of the thoracic aorta. Pulmonary vasculature is normal. No consolidation, pleural effusion, or pneumothorax. No acute osseous abnormalities are seen. IMPRESSION:  Stable mild chronic hyperinflation and bronchitic change. No superimposed acute abnormality. Thoracic aortic atherosclerosis. Electronically Signed   By: Rubye Oaks M.D.   On: 02/06/2016 21:39     Management plans discussed with the patient, family and they are in agreement.  CODE STATUS:     Code Status Orders        Start     Ordered   02/07/16 0126  Do not attempt resuscitation (DNR)  Continuous    Question Answer Comment  In the event of cardiac or respiratory ARREST Do not call a "code blue"   In the event of cardiac or respiratory ARREST Do not perform Intubation, CPR, defibrillation or ACLS   In the event of cardiac or respiratory ARREST Use medication by any route, position, wound care, and other measures to relive pain and suffering. May use oxygen, suction and manual treatment of airway obstruction as needed for comfort.      02/07/16 0125    Code Status History    Date Active Date Inactive Code Status Order ID Comments User Context   02/07/2016  1:25 AM 02/07/2016 11:14 AM DNR [id number]  Arnaldo Natal, MD Inpatient   02/07/2016 12:57 AM 02/07/2016  1:25 AM Full Code [id number]  Oralia Manis, MD Inpatient   09/15/2015 11:30 PM 09/17/2015  5:11 PM Full Code [id number]  Shaune Pollack, MD Inpatient    Questions for Most Recent Historical Code Status (Order [id number])    Question Answer Comment   In the event of cardiac or respiratory ARREST Do not call a "code blue"    In the event of cardiac or respiratory ARREST Do not perform Intubation, CPR, defibrillation or ACLS    In the event of cardiac or respiratory ARREST Use medication by any route, position, wound care, and other measures to relive pain and suffering. May use oxygen, suction and manual treatment of airway obstruction as needed for comfort.         Advance Directive Documentation   Flowsheet Row Most Recent Value  Type of Advance Directive  Healthcare Power of Sarita Deese/ daughter]  Pre-existing out of  facility DNR order (yellow form or pink MOST form)  No data  "MOST" Form in Place?  No data      TOTAL TIME TAKING CARE OF THIS PATIENT: 33 minutes.    Shaune Pollack M.D on 02/07/2016 at 1:52 PM  Between 7am to 6pm - Pager - [phone]  After 6pm go to [email or web address] - Therapist, nutritional Hospitalists  Office  [phone]  CC:  Primary care physician; Pcp Not In System   Note: This dictation was prepared with Dragon dictation along with smaller phrase technology. Any transcriptional errors that result from this process are unintentional.

## 2016-02-07 NOTE — Discharge Instructions (Signed)
Heart healthy diet. °Activity as tolerated. °

## 2016-02-07 NOTE — Progress Notes (Signed)
Pt continues to complain of a 8/10 headache. Dr. Imogene Burnhen notified and he has ordered ibuprofen prn. I am unable to see echo results in computer, but per Dr. Imogene Burnhen patient may be discharged. Dr. Imogene Burnhen is aware that patient did not receive BP meds due to low DBP today.

## 2016-02-07 NOTE — Progress Notes (Signed)
*  PRELIMINARY RESULTS* Echocardiogram 2D Echocardiogram has been performed.  Patricia Gomez 02/07/2016, 11:39 AM

## 2016-02-08 LAB — ECHOCARDIOGRAM COMPLETE
Height: 63 in
WEIGHTICAEL: 2993.6 [oz_av]

## 2018-03-31 IMAGING — CT CT HEAD W/O CM
1 series · 16 of 30 positions shown, 20 images · non-contrast
Comparison: None.

CLINICAL DATA: Status post fall in [REDACTED]. Patient feels weekend
nauseated.

EXAM:
CT HEAD WITHOUT CONTRAST
TECHNIQUE: Contiguous axial images were obtained from the base of the skull
through the vertex without intravenous contrast.

[Series 2: head wo · axial · 0.43mm/px · z∈[+379,+523]mm · 16 of 36 slices shown, 20 images]
[im 2/36  brain]
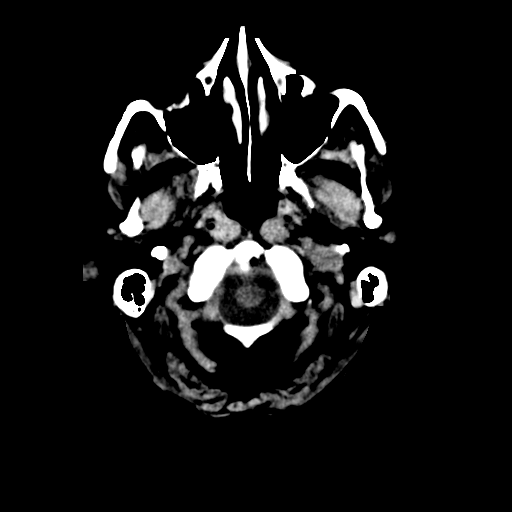
[im 2/36  bone]
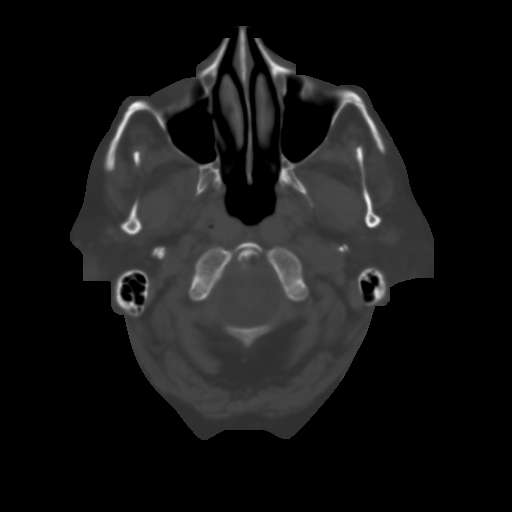
[im 4/36  brain]
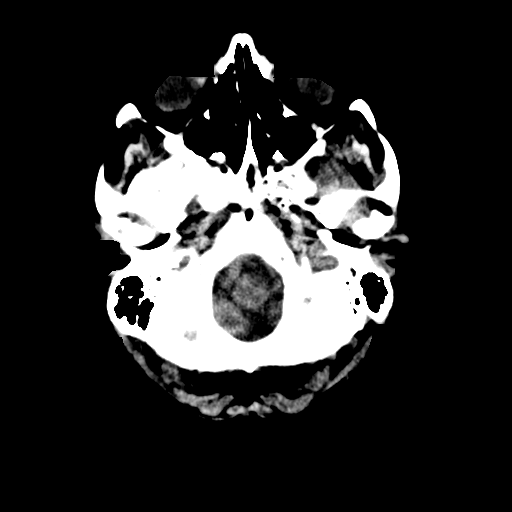
[im 7/36  brain]
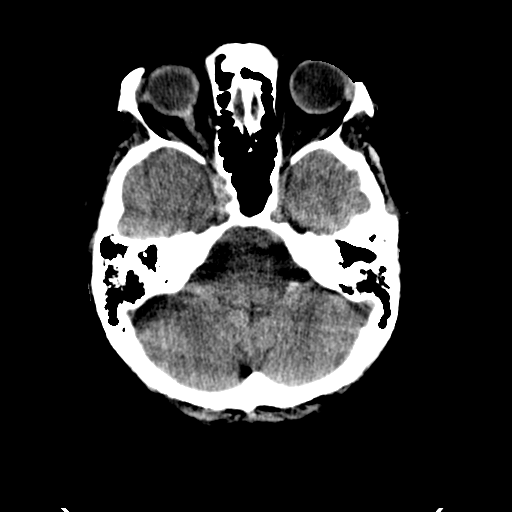
[im 9/36  brain]
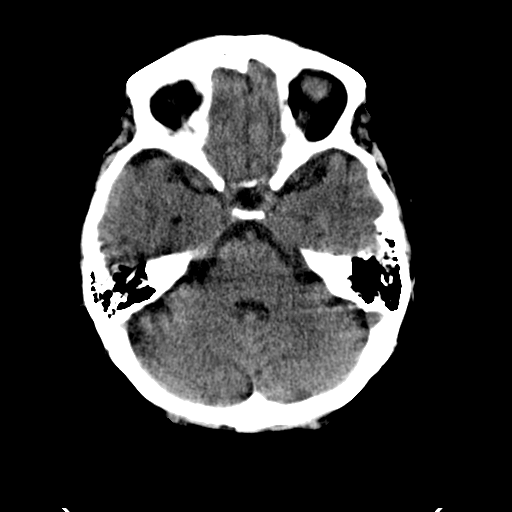
[im 10/36  brain]
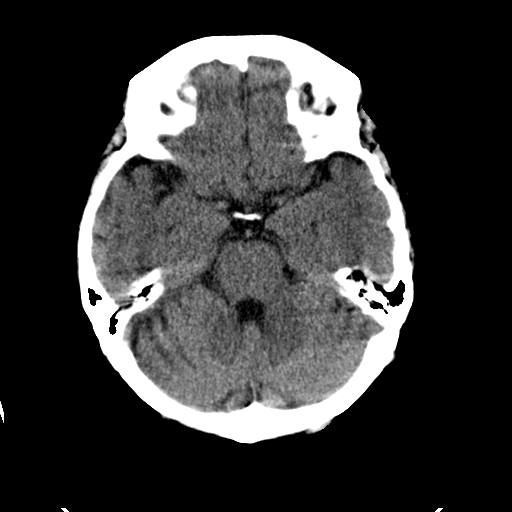
[im 10/36  bone]
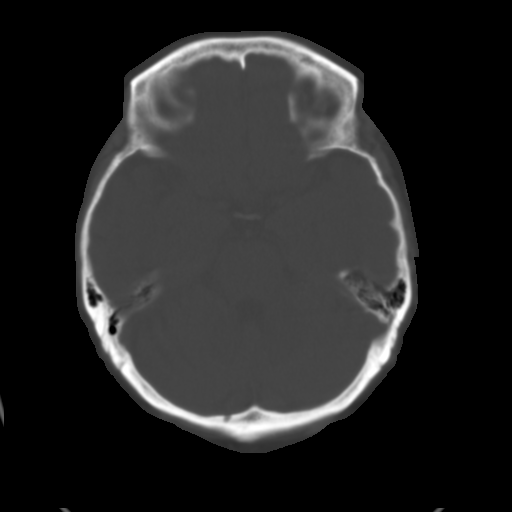
[im 13/36  brain]
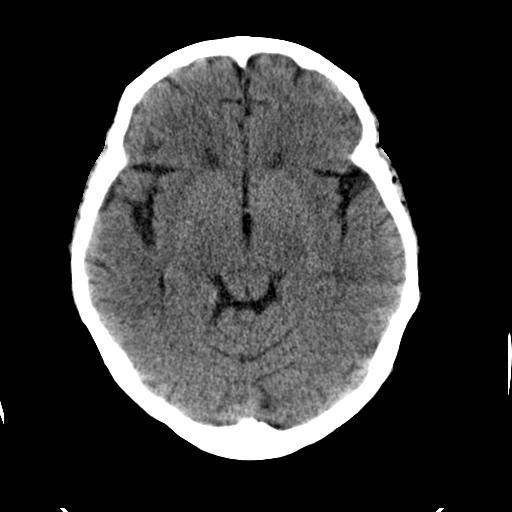
[im 15/36  brain]
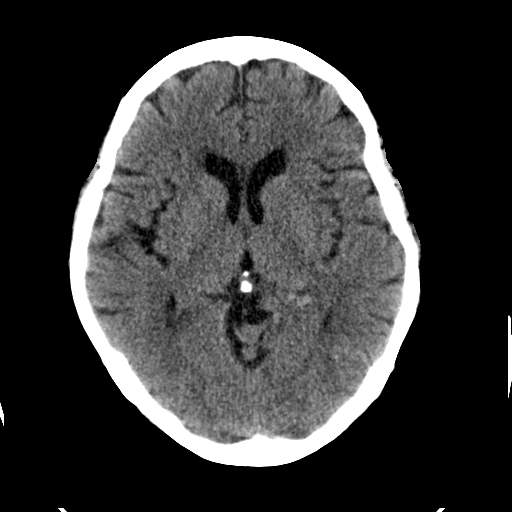
[im 17/36  brain]
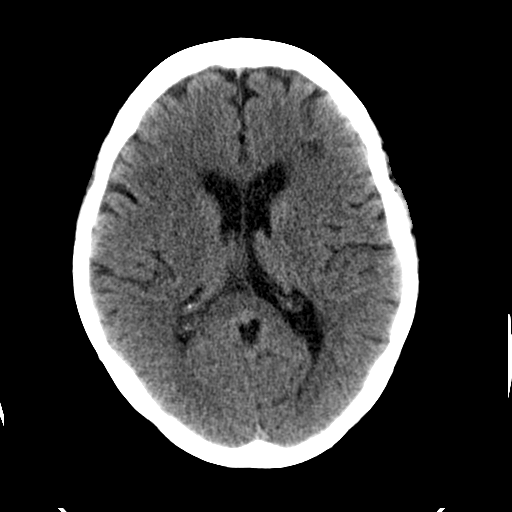
[im 19/36  brain]
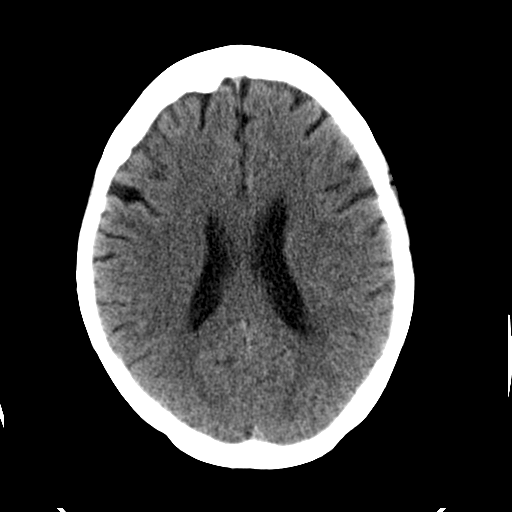
[im 19/36  bone]
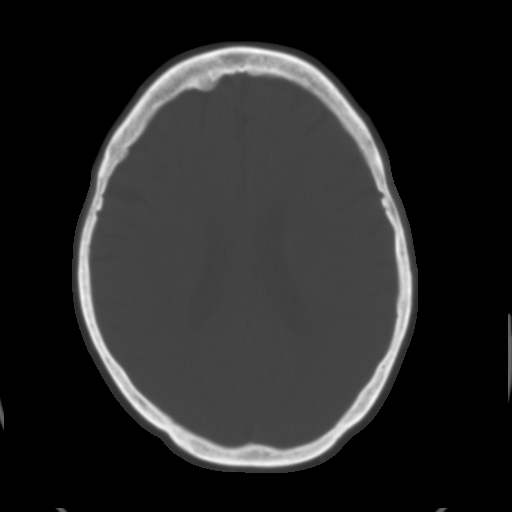
[im 21/36  brain]
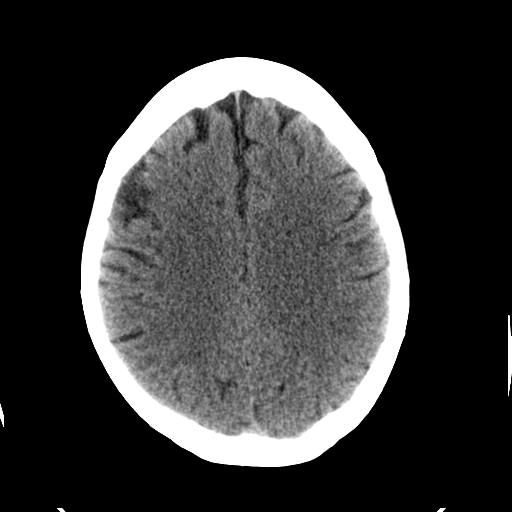
[im 23/36  brain]
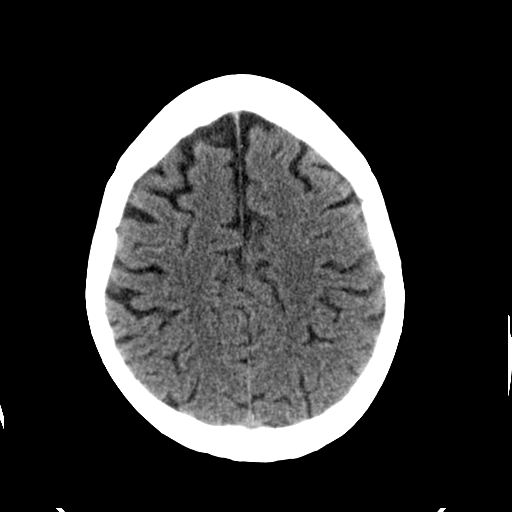
[im 26/36  brain]
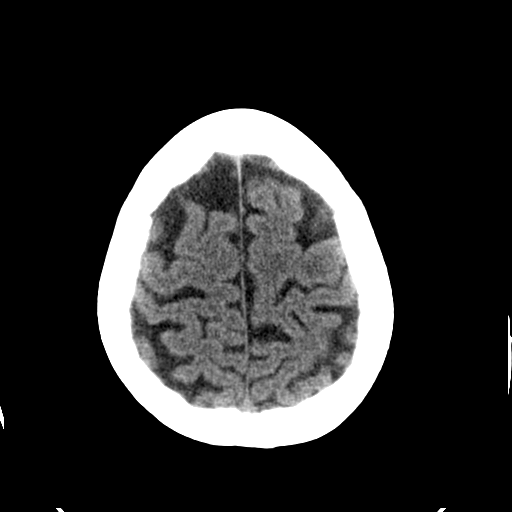
[im 27/36  brain]
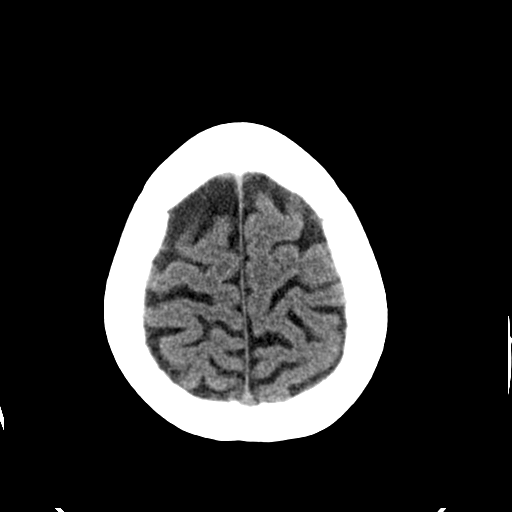
[im 27/36  bone]
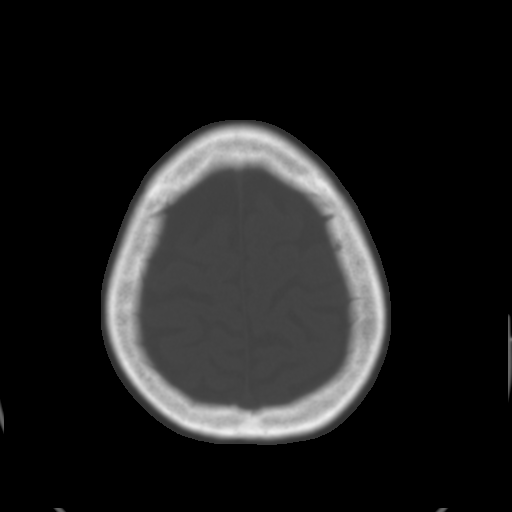
[im 29/36  brain]
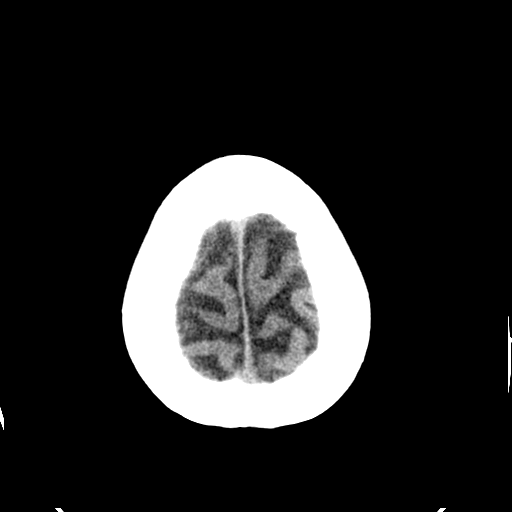
[im 32/36  brain]
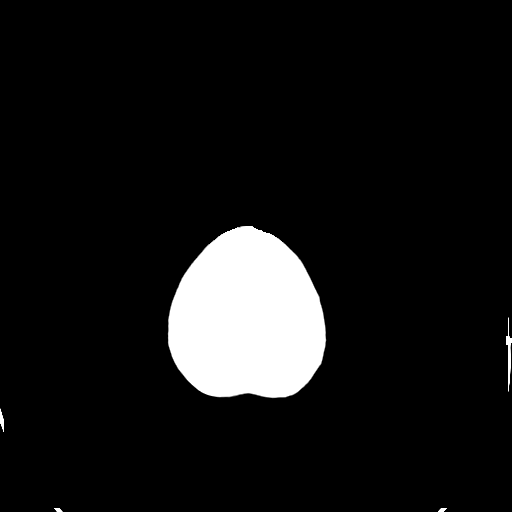
[im 34/36  brain]
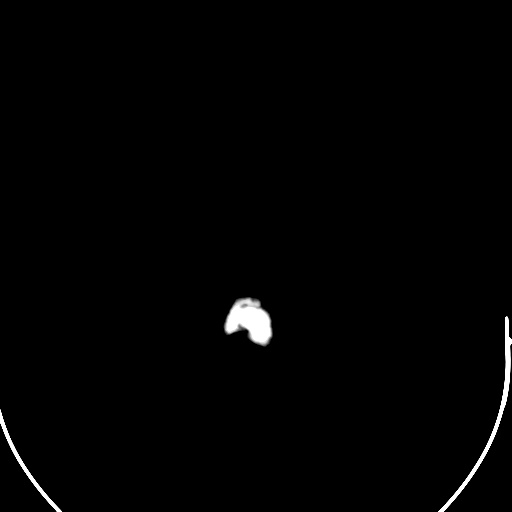

[16 of 30 positions shown; findings below may reference images not displayed]

FINDINGS: There is chronic diffuse atrophy. There is no midline shift,
hydrocephalus, or mass. No acute hemorrhage or acute transcortical
infarct is identified. The bony calvarium is intact. The visualized
sinuses are clear.
IMPRESSION: No focal acute intracranial abnormality identified. Chronic diffuse
atrophy.

## 2018-03-31 IMAGING — CT CT ABD-PELV W/ CM
1 of 3 series · 14 of 32 positions shown, 19 images · IV contrast (iopamidol)
Comparison: None.

CLINICAL DATA: Weak, nauseated.

EXAM:
CT ABDOMEN AND PELVIS WITH CONTRAST
TECHNIQUE: Multidetector CT imaging of the abdomen and pelvis was performed
using the standard protocol following bolus administration of
intravenous contrast.
CONTRAST:  80mL BQQWBN-3BB IOPAMIDOL (BQQWBN-3BB) INJECTION 61%

[Series 2: routine abd pel with · axial · 0.81mm/px · z∈[-375,+30]mm · 14 of 93 slices shown, 19 images]
[im 6/93  soft-tissue]
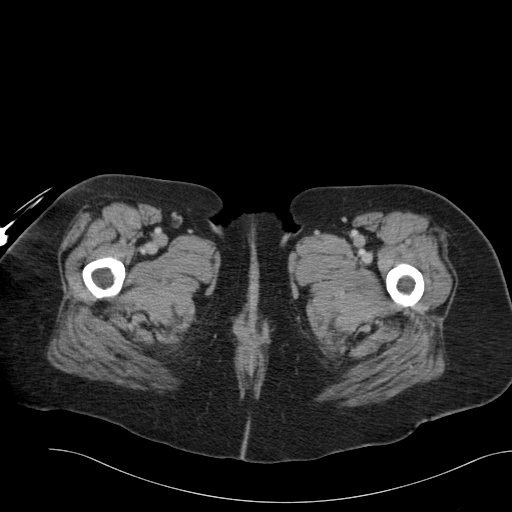
[im 6/93  bone]
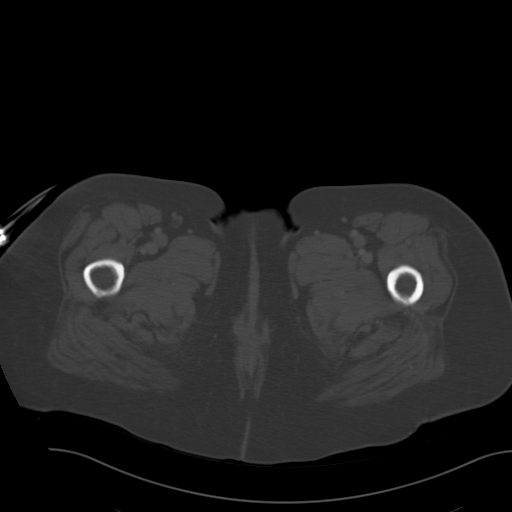
[im 11/93  soft-tissue]
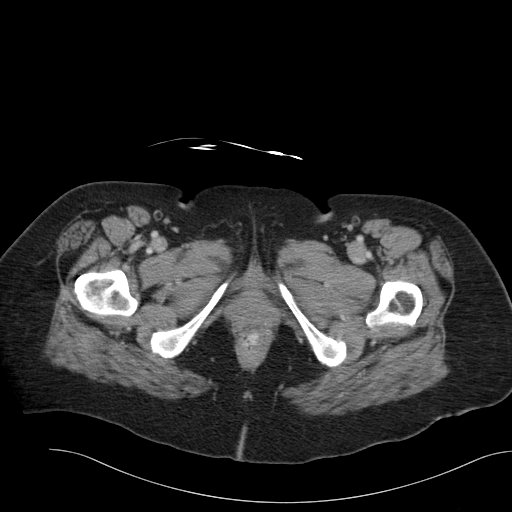
[im 21/93  soft-tissue]
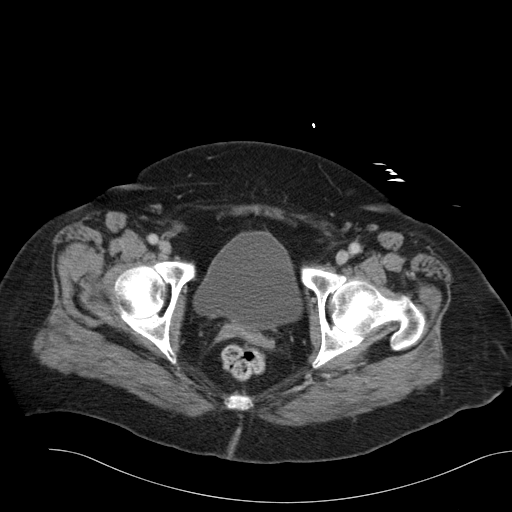
[im 26/93  soft-tissue]
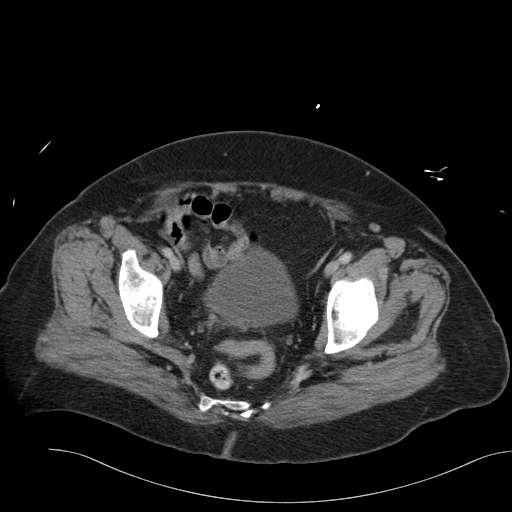
[im 31/93  soft-tissue]
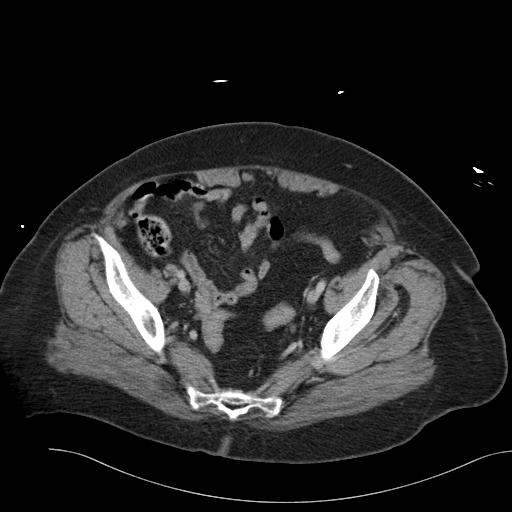
[im 41/93  soft-tissue]
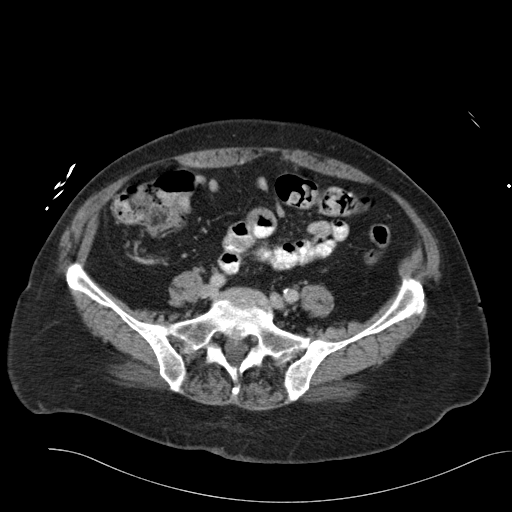
[im 47/93  soft-tissue]
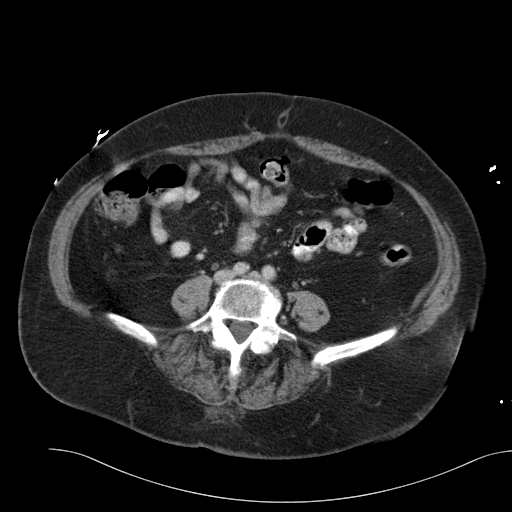
[im 52/93  soft-tissue]
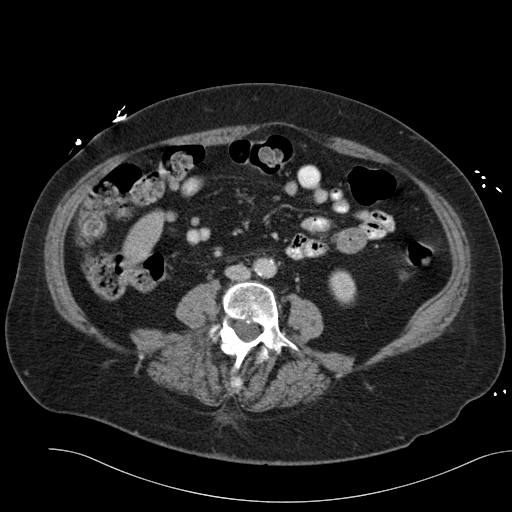
[im 62/93  soft-tissue]
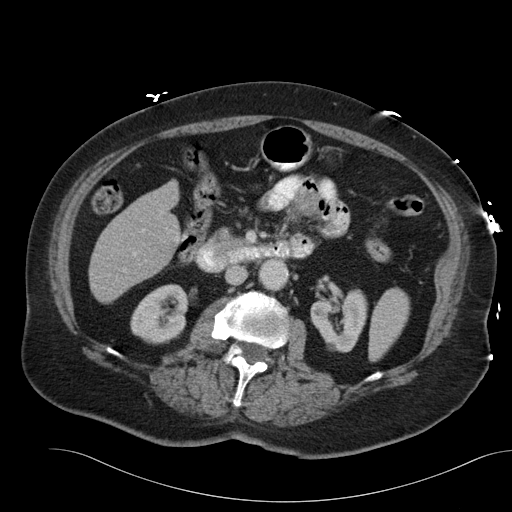
[im 62/93  bone]
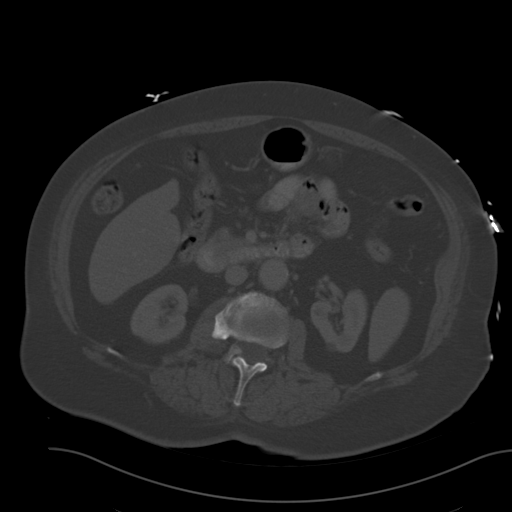
[im 67/93  soft-tissue]
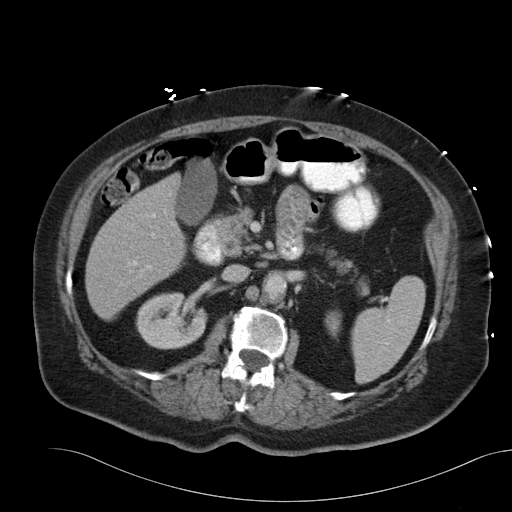
[im 72/93  soft-tissue]
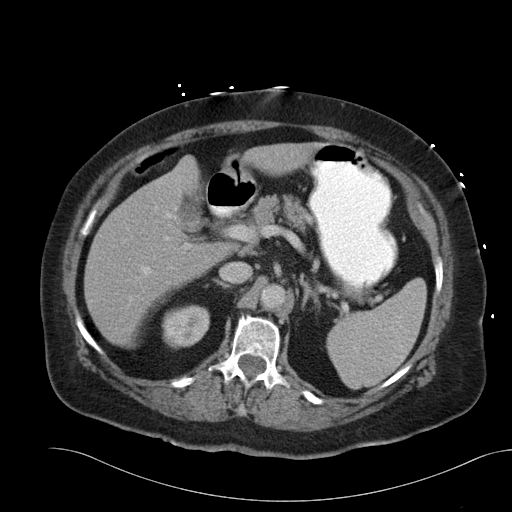
[im 72/93  lung]
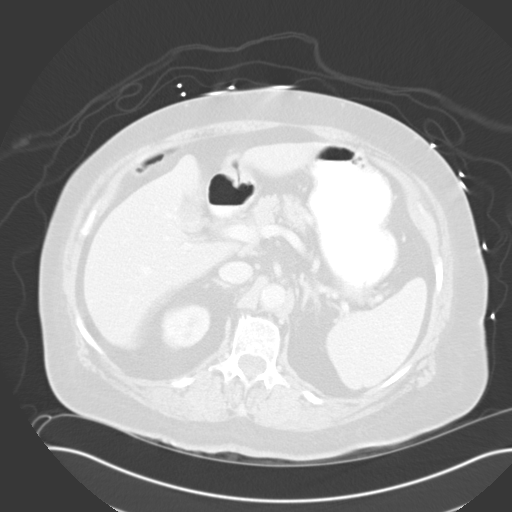
[im 77/93  lung]
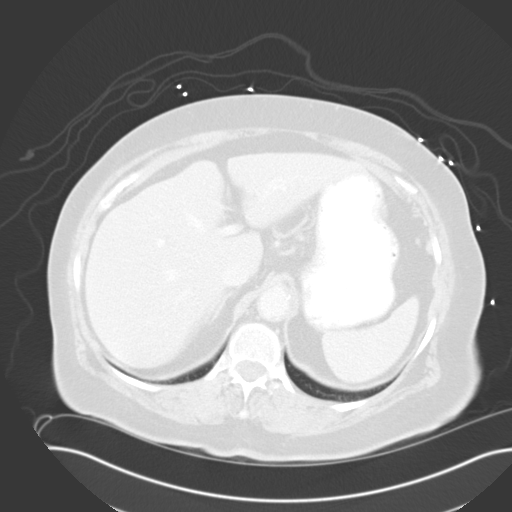
[im 82/93  soft-tissue]
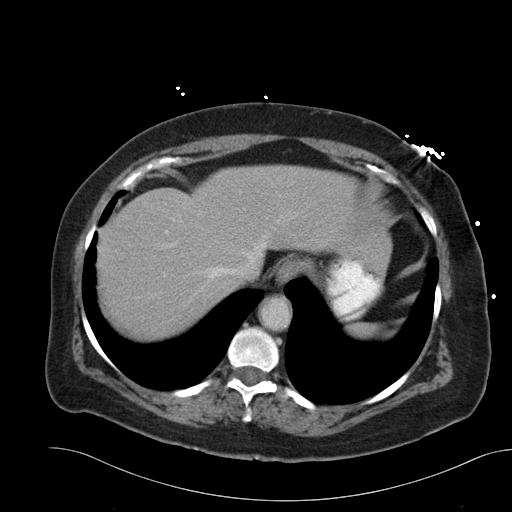
[im 82/93  lung]
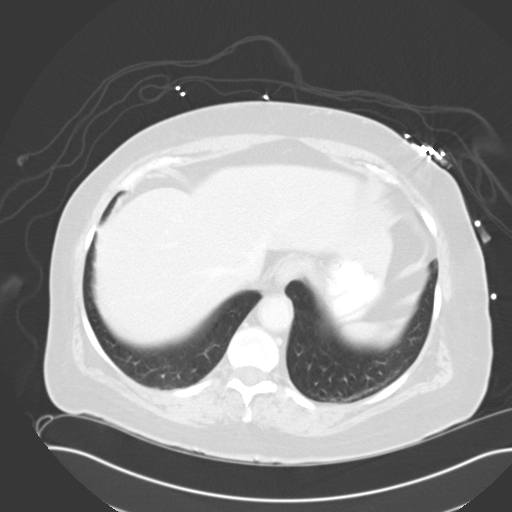
[im 87/93  soft-tissue]
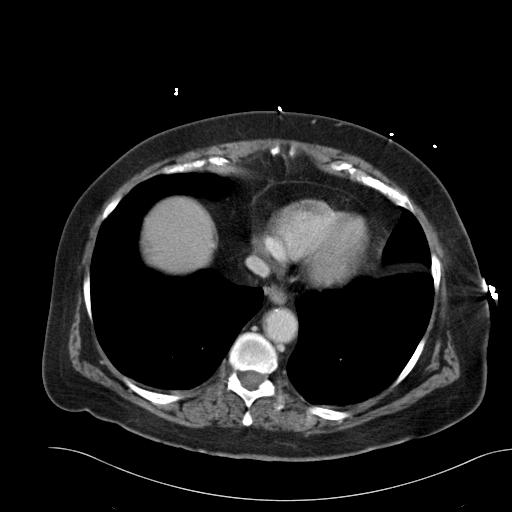
[im 87/93  lung]
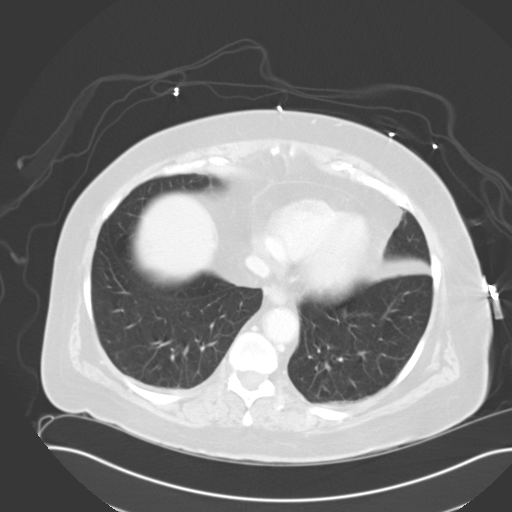

[14 of 32 positions shown; findings below may reference images not displayed]

FINDINGS: Lower chest:  There is minimal atelectasis of the left lung base.

Hepatobiliary: There are calcified granulomas in the liver. There is
a 1 cm low density in the right lobe liver. This is none specific,
but can represent a cyst. The gallbladder is normal.

Pancreas: No mass, inflammatory changes, or other significant
abnormality.

Spleen: Within normal limits in size and appearance.

Adrenals/Urinary Tract: The adrenal glands are normal. There are
bilateral small cysts in the kidneys. No evidence of hydronephrosis.

Stomach/Bowel: No evidence of obstruction, inflammatory process, or
abnormal fluid collections.There is no diverticulitis. The appendix
is not seen but no inflammation is noted around the cecum.

Vascular/Lymphatic: No pathologically enlarged lymph nodes. No
evidence of abdominal aortic aneurysm.There is atherosclerosis of
the aorta.

Reproductive: There is hysterectomy. No mass or other significant
abnormality.

Other:  There is minimal umbilical hernia of mesenteric fat.

Musculoskeletal:  There are degenerative changes of the spine.
IMPRESSION: No acute abnormality identified in the abdomen and pelvis.

Calcified granulomas in the liver.

Prior hysterectomy.

## 2020-09-07 ENCOUNTER — Other Ambulatory Visit: Payer: Self-pay | Admitting: Internal Medicine

## 2020-09-07 DIAGNOSIS — R2241 Localized swelling, mass and lump, right lower limb: Secondary | ICD-10-CM

## 2020-09-10 ENCOUNTER — Ambulatory Visit: Payer: Medicare (Managed Care)

## 2020-09-16 ENCOUNTER — Other Ambulatory Visit: Payer: Self-pay

## 2020-09-16 ENCOUNTER — Ambulatory Visit
Admission: RE | Admit: 2020-09-16 | Discharge: 2020-09-16 | Disposition: A | Discharge status: Home / Self Care | Payer: Medicare (Managed Care) | Source: Ambulatory Visit | Attending: Internal Medicine | Admitting: Internal Medicine

## 2020-09-16 DIAGNOSIS — R2241 Localized swelling, mass and lump, right lower limb: Secondary | ICD-10-CM | POA: Insufficient documentation

## 2021-03-08 ENCOUNTER — Other Ambulatory Visit: Payer: Self-pay | Admitting: Orthopedic Surgery

## 2021-03-09 ENCOUNTER — Other Ambulatory Visit: Payer: Self-pay

## 2021-03-09 ENCOUNTER — Encounter: Payer: Self-pay | Admitting: Certified Registered Nurse Anesthetist

## 2021-03-09 ENCOUNTER — Ambulatory Visit
Admission: RE | Admit: 2021-03-09 | Discharge: 2021-03-09 | Disposition: A | Discharge status: Home / Self Care | Payer: Medicare (Managed Care) | Attending: Orthopedic Surgery | Admitting: Orthopedic Surgery

## 2021-03-09 ENCOUNTER — Encounter
Admission: RE | Disposition: A | Discharge status: Home / Self Care | Payer: Self-pay | Source: Home / Self Care | Attending: Orthopedic Surgery

## 2021-03-09 ENCOUNTER — Encounter: Payer: Self-pay | Admitting: Orthopedic Surgery

## 2021-03-09 DIAGNOSIS — Z881 Allergy status to other antibiotic agents status: Secondary | ICD-10-CM | POA: Insufficient documentation

## 2021-03-09 DIAGNOSIS — M549 Dorsalgia, unspecified: Secondary | ICD-10-CM | POA: Insufficient documentation

## 2021-03-09 DIAGNOSIS — Z79891 Long term (current) use of opiate analgesic: Secondary | ICD-10-CM | POA: Diagnosis not present

## 2021-03-09 DIAGNOSIS — S52571A Other intraarticular fracture of lower end of right radius, initial encounter for closed fracture: Secondary | ICD-10-CM | POA: Insufficient documentation

## 2021-03-09 DIAGNOSIS — Y92030 Kitchen in apartment as the place of occurrence of the external cause: Secondary | ICD-10-CM | POA: Diagnosis not present

## 2021-03-09 DIAGNOSIS — Z79899 Other long term (current) drug therapy: Secondary | ICD-10-CM | POA: Insufficient documentation

## 2021-03-09 DIAGNOSIS — Z880 Allergy status to penicillin: Secondary | ICD-10-CM | POA: Insufficient documentation

## 2021-03-09 DIAGNOSIS — G8929 Other chronic pain: Secondary | ICD-10-CM | POA: Diagnosis not present

## 2021-03-09 DIAGNOSIS — Z7982 Long term (current) use of aspirin: Secondary | ICD-10-CM | POA: Diagnosis not present

## 2021-03-09 DIAGNOSIS — Z5309 Procedure and treatment not carried out because of other contraindication: Secondary | ICD-10-CM | POA: Insufficient documentation

## 2021-03-09 DIAGNOSIS — W19XXXA Unspecified fall, initial encounter: Secondary | ICD-10-CM | POA: Diagnosis not present

## 2021-03-09 DIAGNOSIS — Z888 Allergy status to other drugs, medicaments and biological substances status: Secondary | ICD-10-CM | POA: Insufficient documentation

## 2021-03-09 LAB — CBC
HCT: 39.3 % (ref 36.0–46.0)
Hemoglobin: 13.5 g/dL (ref 12.0–15.0)
MCH: 29.9 pg (ref 26.0–34.0)
MCHC: 34.4 g/dL (ref 30.0–36.0)
MCV: 87.1 fL (ref 80.0–100.0)
Platelets: 336 10*3/uL (ref 150–400)
RBC: 4.51 MIL/uL (ref 3.87–5.11)
RDW: 13.7 % (ref 11.5–15.5)
WBC: 8.5 10*3/uL (ref 4.0–10.5)
nRBC: 0 % (ref 0.0–0.2)

## 2021-03-09 LAB — BASIC METABOLIC PANEL
Anion gap: 11 (ref 5–15)
BUN: 14 mg/dL (ref 8–23)
CO2: 29 mmol/L (ref 22–32)
Calcium: 8.6 mg/dL — ABNORMAL LOW (ref 8.9–10.3)
Chloride: 95 mmol/L — ABNORMAL LOW (ref 98–111)
Creatinine, Ser: 0.93 mg/dL (ref 0.44–1.00)
GFR, Estimated: >60 mL/min (ref >60)
Glucose, Bld: 114 mg/dL — ABNORMAL HIGH (ref 70–99)
Potassium: 2.7 mmol/L — CL (ref 3.5–5.1)
Sodium: 135 mmol/L (ref 135–145)

## 2021-03-09 SURGERY — OPEN REDUCTION INTERNAL FIXATION (ORIF) DISTAL RADIUS FRACTURE
Anesthesia: Choice

## 2021-03-09 MED ORDER — CLINDAMYCIN PHOSPHATE 600 MG/50ML IV SOLN: 600.0000 mg | INTRAVENOUS | Status: DC

## 2021-03-09 MED ORDER — CLINDAMYCIN PHOSPHATE 600 MG/50ML IV SOLN
INTRAVENOUS | Status: AC
  Filled 2021-03-09: qty 50

## 2021-03-09 MED ORDER — LACTATED RINGERS IV SOLN: INTRAVENOUS | Status: DC

## 2021-03-09 MED ORDER — CHLORHEXIDINE GLUCONATE 0.12 % MT SOLN
OROMUCOSAL | Status: AC
  Administered 2021-03-09: 15 mL via OROMUCOSAL
  Filled 2021-03-09: qty 15

## 2021-03-09 MED ORDER — ORAL CARE MOUTH RINSE: 15.0000 mL | Freq: Once | OROMUCOSAL | Status: AC

## 2021-03-09 MED ORDER — CEFAZOLIN SODIUM-DEXTROSE 2-4 GM/100ML-% IV SOLN: 2.0000 g | INTRAVENOUS | Status: DC

## 2021-03-09 MED ORDER — CHLORHEXIDINE GLUCONATE 0.12 % MT SOLN: 15.0000 mL | Freq: Once | OROMUCOSAL | Status: AC

## 2021-03-09 SURGICAL SUPPLY — 33 items
APL PRP STRL LF DISP 70% ISPRP (MISCELLANEOUS) ×1
BNDG ELASTIC 4X5.8 VLCR STR LF (GAUZE/BANDAGES/DRESSINGS) ×3 IMPLANT
CHLORAPREP W/TINT 26 (MISCELLANEOUS) ×3 IMPLANT
CUFF TOURN SGL QUICK 18X4 (TOURNIQUET CUFF) IMPLANT
DRAPE FLUOR MINI C-ARM 54X84 (DRAPES) ×3 IMPLANT
ELECT REM PT RETURN 9FT ADLT (ELECTROSURGICAL) ×2
ELECTRODE REM PT RTRN 9FT ADLT (ELECTROSURGICAL) ×2 IMPLANT
GAUZE 4X4 16PLY ~~LOC~~+RFID DBL (SPONGE) ×3 IMPLANT
GAUZE SPONGE 4X4 12PLY STRL (GAUZE/BANDAGES/DRESSINGS) ×3 IMPLANT
GAUZE XEROFORM 1X8 LF (GAUZE/BANDAGES/DRESSINGS) ×6 IMPLANT
GLOVE SURG SYN 9.0  PF PI (GLOVE) ×1
GLOVE SURG SYN 9.0 PF PI (GLOVE) ×2 IMPLANT
GOWN SRG 2XL LVL 4 RGLN SLV (GOWNS) ×2 IMPLANT
GOWN STRL NON-REIN 2XL LVL4 (GOWNS) ×2
GOWN STRL REUS W/ TWL LRG LVL3 (GOWN DISPOSABLE) ×2 IMPLANT
GOWN STRL REUS W/TWL LRG LVL3 (GOWN DISPOSABLE) ×2
KIT TURNOVER KIT A (KITS) ×3 IMPLANT
MANIFOLD NEPTUNE II (INSTRUMENTS) ×3 IMPLANT
NDL FILTER BLUNT 18X1 1/2 (NEEDLE) ×1 IMPLANT
NEEDLE FILTER BLUNT 18X 1/2SAF (NEEDLE) ×1
NEEDLE FILTER BLUNT 18X1 1/2 (NEEDLE) ×1 IMPLANT
NS IRRIG 500ML POUR BTL (IV SOLUTION) ×3 IMPLANT
PACK EXTREMITY ARMC (MISCELLANEOUS) ×3 IMPLANT
PAD CAST CTTN 4X4 STRL (SOFTGOODS) ×4 IMPLANT
PADDING CAST COTTON 4X4 STRL (SOFTGOODS) ×4
SCALPEL PROTECTED #15 DISP (BLADE) ×6 IMPLANT
SPLINT CAST 1 STEP 3X12 (MISCELLANEOUS) ×3 IMPLANT
SUT ETHILON 4-0 (SUTURE) ×2
SUT ETHILON 4-0 FS2 18XMFL BLK (SUTURE) ×1
SUT VICRYL 3-0 27IN (SUTURE) ×3 IMPLANT
SUTURE ETHLN 4-0 FS2 18XMF BLK (SUTURE) ×2 IMPLANT
SYR 3ML LL SCALE MARK (SYRINGE) ×3 IMPLANT
WATER STERILE IRR 500ML POUR (IV SOLUTION) ×3 IMPLANT

## 2021-03-09 NOTE — Progress Notes (Signed)
Patient's K+ lab draw came back low at 2.7.  Dr. Rosita Kea and Dr. Suzan Slick made aware of the lab value.  Decision was made to cancel the surgery.  Dr. Rosita Kea asked [place] to reach out to his PA to order potassium prescription.

## 2021-03-09 NOTE — H&P (Signed)
Patricia Gomez, [place] - 03/08/2021 3:00 PM EDT Formatting of this note is different from the original. Images from the original note were not included. Chief Complaint: Chief Complaint  Patient presents with   Right wrist fracture   Patricia Gomez is a 75 y.o. female who presents today for evaluation of a right wrist fracture. Patient states last week on Thursday, March 04, 2021 she fell in her kitchen at her apartment and injured her right wrist. She was seen at [facility] where she was found to have a displaced impacted distal radial fracture with comminution. She was advised to undergo surgery but did not want to have surgery in [place] so she scheduled an appointment to see [place]. She has been wearing a splint. She takes tramadol 2 tablets twice daily at baseline for chronic back pain. She has been also taking additional Tylenol. Pain has been moderate to severe but has been slowly improving. She denies any numbness or tingling in the right upper extremity.  Past Medical History: Past Medical History:  Diagnosis Date   COPD (chronic obstructive pulmonary disease) , unspecified (CMS-HCC)   GERD (gastroesophageal reflux disease)   Hypertension   Migraines   Past Surgical History: Past Surgical History:  Procedure Laterality Date   HYSTERECTOMY  75 yrs old   Interposition arthropalsty left thumb CMC joint. Excision of left volar carpal gangion. Left 05/21/2015   Left Total Knee Arthoplasty  Dr. Ernest Pine   Ovary Removed   Right Total Knee Arthoplasty  Dr. Ernest Pine   Past Family History: Family History  Problem Relation Age of Onset   No Known Problems Mother   No Known Problems Father   Medications: Current Outpatient Medications Ordered in Epic  Medication Sig Dispense Refill   albuterol 90 mcg/actuation inhaler Inhale into the lungs. Inhale 2 puffs into the lungs every 6 (six) hours as needed for wheezing or shortness of breath.   aspirin 81 MG EC tablet Take by mouth.  Take 81 mg by mouth daily.   benazepril (LOTENSIN) 40 MG tablet 40 mg tablet daily.   ferrous sulfate 325 (65 FE) MG tablet Take 325 mg by mouth once daily. 0   hydrOXYzine (ATARAX) 25 MG tablet take 1 tablet by mouth three times a day if needed for MUSCLE CRAMPS 0   omeprazole (PRILOSEC) 20 MG DR capsule Take by mouth. Take 20 mg by mouth daily.   ondansetron (ZOFRAN) 4 MG tablet Take by mouth.   propranolol (INDERAL) 10 MG tablet Take 10 mg by mouth 3 (three) times daily. 0   traMADol (ULTRAM) 50 mg tablet Take by mouth. Take 1 tablet (50 mg total) by mouth 3 (three) times daily. 1-2 tablets   traMADoL (ULTRAM) 50 mg tablet Take 1-2 tablets (50-100 mg total) by mouth every 6 (six) hours as needed for Pain for up to 5 days 30 tablet 0   VITAMIN D2 50,000 unit capsule Take 50,000 Units by mouth once a week. 0   No current Epic-ordered facility-administered medications on file.   Allergies: Allergies  Allergen Reactions   Metronidazole Nausea And Vomiting   Prednisone Diarrhea and Nausea And Vomiting   Penicillins Rash  Told by parent    Review of Systems:  A comprehensive 14 point ROS was performed, reviewed by me today, and the pertinent orthopaedic findings are documented in the HPI.  Exam: BP (!) 160/82  General:  Well developed, well nourished, no apparent distress, normal affect, nonantalgic gait with a walker  HEENT: Head normocephalic,  atraumatic, PERRL.   Abdomen: Soft, non tender, non distended, Bowel sounds present.  Heart: Examination of the heart reveals regular, rate, and rhythm. There is no murmur noted on ascultation. There is a normal apical pulse.  Lungs: Lungs are clear to auscultation. There is no wheeze, rhonchi, or crackles. There is normal expansion of bilateral chest walls.   Right upper extremity: Examination of the right upper extremity shows mild swelling and bruising. She is able to make a full fist. Sensation is intact distally. 2+ radial pulse  and 2+ cap refill. She is tender along the distal radius. No tenderness along the distal ulna. No tenderness throughout the carpals or phalanges. She has good range of motion of the elbow. No skin breakdown noted.  AP lateral and oblique views of the right wrist are ordered and interpreted by me in the office today. Impression: Patient has shortened/impacted distal radial metaphyseal fracture. There is comminution of the distal radius that does extend intra-articularly. There is dorsal angulation of the distal radial metaphyseal fracture. Patient has degenerative changes of the [facility] and scaphotrapezial joint. No evidence of ulnar fracture. No other evidence of acute bony abnormality.  Impression: Other closed intra-articular fracture of distal end of right radius, initial encounter [S52.571A] Displaced closed intra-articular fracture of distal end of right radius, initial encounter (primary encounter diagnosis)  Plan:  64. 75 year old female with right distal radius fracture with displacement and comminution. She lives in an apartment by herself and lives independently. Due to severity of displacement, patient has agreed and consented to a right distal radius ORIF. Risks, benefits, complications of a right distal radius ORIF have been discussed with the patient. Patient has agreed and consented procedure with Dr. Kennedy Bucker. We will try to schedule this procedure for tomorrow 03/09/2021. Today, patient is placed into a Velcro wrist brace. Patient will continue with Tylenol and tramadol  This note was generated in part with voice recognition software and I apologize for any typographical errors that were not detected and corrected.  Patricia Gomez MPA-C   Electronically signed by Patricia Musca, PA at 03/08/2021 3:28 PM EDT   Reviewed  H+P. No changes noted.

## 2021-03-10 ENCOUNTER — Other Ambulatory Visit: Payer: Self-pay | Admitting: Orthopedic Surgery

## 2021-03-12 ENCOUNTER — Ambulatory Visit: Payer: Medicare (Managed Care) | Admitting: Anesthesiology

## 2021-03-12 ENCOUNTER — Encounter
Admission: RE | Disposition: A | Discharge status: Home / Self Care | Payer: Self-pay | Source: Ambulatory Visit | Attending: Orthopedic Surgery

## 2021-03-12 ENCOUNTER — Encounter: Payer: Self-pay | Admitting: Orthopedic Surgery

## 2021-03-12 ENCOUNTER — Other Ambulatory Visit: Payer: Self-pay

## 2021-03-12 ENCOUNTER — Observation Stay: Payer: Medicare (Managed Care)

## 2021-03-12 ENCOUNTER — Ambulatory Visit: Payer: Medicare (Managed Care)

## 2021-03-12 ENCOUNTER — Observation Stay
Admission: RE | Admit: 2021-03-12 | Discharge: 2021-03-13 | Disposition: A | Discharge status: Home / Self Care | Payer: Medicare (Managed Care) | Source: Ambulatory Visit | Attending: Orthopedic Surgery | Admitting: Orthopedic Surgery

## 2021-03-12 DIAGNOSIS — Z8781 Personal history of (healed) traumatic fracture: Secondary | ICD-10-CM

## 2021-03-12 DIAGNOSIS — I129 Hypertensive chronic kidney disease with stage 1 through stage 4 chronic kidney disease, or unspecified chronic kidney disease: Secondary | ICD-10-CM | POA: Insufficient documentation

## 2021-03-12 DIAGNOSIS — N189 Chronic kidney disease, unspecified: Secondary | ICD-10-CM | POA: Diagnosis not present

## 2021-03-12 DIAGNOSIS — Z7982 Long term (current) use of aspirin: Secondary | ICD-10-CM | POA: Insufficient documentation

## 2021-03-12 DIAGNOSIS — Z79899 Other long term (current) drug therapy: Secondary | ICD-10-CM | POA: Insufficient documentation

## 2021-03-12 DIAGNOSIS — J449 Chronic obstructive pulmonary disease, unspecified: Secondary | ICD-10-CM | POA: Insufficient documentation

## 2021-03-12 DIAGNOSIS — W19XXXA Unspecified fall, initial encounter: Secondary | ICD-10-CM | POA: Insufficient documentation

## 2021-03-12 DIAGNOSIS — S52571A Other intraarticular fracture of lower end of right radius, initial encounter for closed fracture: Secondary | ICD-10-CM | POA: Diagnosis present

## 2021-03-12 DIAGNOSIS — J45909 Unspecified asthma, uncomplicated: Secondary | ICD-10-CM | POA: Diagnosis not present

## 2021-03-12 DIAGNOSIS — Z9889 Other specified postprocedural states: Secondary | ICD-10-CM

## 2021-03-12 DIAGNOSIS — Z96653 Presence of artificial knee joint, bilateral: Secondary | ICD-10-CM | POA: Diagnosis not present

## 2021-03-12 HISTORY — PX: OPEN REDUCTION INTERNAL FIXATION (ORIF) DISTAL RADIAL FRACTURE: SHX5989

## 2021-03-12 LAB — POCT I-STAT, CHEM 8
BUN: 8 mg/dL (ref 8–23)
Calcium, Ion: 1.1 mmol/L — ABNORMAL LOW (ref 1.15–1.40)
Chloride: 101 mmol/L (ref 98–111)
Creatinine, Ser: 0.7 mg/dL (ref 0.44–1.00)
Glucose, Bld: 98 mg/dL (ref 70–99)
HCT: 42 % (ref 36.0–46.0)
Hemoglobin: 14.3 g/dL (ref 12.0–15.0)
Potassium: 3.7 mmol/L (ref 3.5–5.1)
Sodium: 139 mmol/L (ref 135–145)
TCO2: 24 mmol/L (ref 22–32)

## 2021-03-12 SURGERY — OPEN REDUCTION INTERNAL FIXATION (ORIF) DISTAL RADIUS FRACTURE
Anesthesia: General | Laterality: Right

## 2021-03-12 MED ORDER — TRAMADOL HCL 50 MG PO TABS
50.0000 mg | ORAL_TABLET | Freq: Four times a day (QID) | ORAL | Status: DC
Start: 2021-03-12 — End: 2021-03-13
  Administered 2021-03-12 – 2021-03-13 (×4): 50 mg via ORAL
  Filled 2021-03-12 (×4): qty 1

## 2021-03-12 MED ORDER — ACETAMINOPHEN 325 MG PO TABS: 325.0000 mg | ORAL_TABLET | Freq: Four times a day (QID) | ORAL | Status: DC | PRN

## 2021-03-12 MED ORDER — FENTANYL CITRATE (PF) 100 MCG/2ML IJ SOLN
INTRAMUSCULAR | Status: DC | PRN
  Administered 2021-03-12: 50 ug via INTRAVENOUS

## 2021-03-12 MED ORDER — ZOLPIDEM TARTRATE 5 MG PO TABS: 5.0000 mg | ORAL_TABLET | Freq: Every evening | ORAL | Status: DC | PRN

## 2021-03-12 MED ORDER — PANTOPRAZOLE SODIUM 40 MG PO TBEC
40.0000 mg | DELAYED_RELEASE_TABLET | Freq: Every day | ORAL | Status: DC
  Administered 2021-03-13: 40 mg via ORAL
  Filled 2021-03-12: qty 1

## 2021-03-12 MED ORDER — ONDANSETRON HCL 4 MG PO TABS: 4.0000 mg | ORAL_TABLET | Freq: Four times a day (QID) | ORAL | Status: DC | PRN

## 2021-03-12 MED ORDER — ASPIRIN EC 81 MG PO TBEC
81.0000 mg | DELAYED_RELEASE_TABLET | Freq: Every day | ORAL | Status: DC
  Administered 2021-03-13: 81 mg via ORAL
  Filled 2021-03-12: qty 1

## 2021-03-12 MED ORDER — FERROUS SULFATE 325 (65 FE) MG PO TABS
325.0000 mg | ORAL_TABLET | Freq: Every day | ORAL | Status: DC
  Administered 2021-03-13: 325 mg via ORAL
  Filled 2021-03-12: qty 1

## 2021-03-12 MED ORDER — CLINDAMYCIN PHOSPHATE 600 MG/50ML IV SOLN
600.0000 mg | Freq: Once | INTRAVENOUS | Status: AC
  Administered 2021-03-12: 600 mg via INTRAVENOUS

## 2021-03-12 MED ORDER — CLINDAMYCIN PHOSPHATE 600 MG/50ML IV SOLN
600.0000 mg | Freq: Four times a day (QID) | INTRAVENOUS | Status: AC
Start: 2021-03-12 — End: 2021-03-13
  Administered 2021-03-12 – 2021-03-13 (×2): 600 mg via INTRAVENOUS
  Filled 2021-03-12 (×3): qty 50

## 2021-03-12 MED ORDER — HYDROCODONE-ACETAMINOPHEN 7.5-325 MG PO TABS
1.0000 | ORAL_TABLET | ORAL | Status: DC | PRN
  Administered 2021-03-12 – 2021-03-13 (×4): 2 via ORAL
  Filled 2021-03-12 (×4): qty 2

## 2021-03-12 MED ORDER — FENTANYL CITRATE (PF) 100 MCG/2ML IJ SOLN
INTRAMUSCULAR | Status: AC
  Filled 2021-03-12: qty 2

## 2021-03-12 MED ORDER — VENLAFAXINE HCL ER 75 MG PO CP24
75.0000 mg | ORAL_CAPSULE | Freq: Every day | ORAL | Status: DC
  Administered 2021-03-13: 75 mg via ORAL
  Filled 2021-03-12: qty 1

## 2021-03-12 MED ORDER — NEOMYCIN-POLYMYXIN B GU 40-200000 IR SOLN
Status: AC
  Filled 2021-03-12: qty 2

## 2021-03-12 MED ORDER — CHLORHEXIDINE GLUCONATE 0.12 % MT SOLN
OROMUCOSAL | Status: AC
  Filled 2021-03-12: qty 15

## 2021-03-12 MED ORDER — SODIUM CHLORIDE 0.9 % IV SOLN: INTRAVENOUS | Status: DC

## 2021-03-12 MED ORDER — FENTANYL CITRATE (PF) 100 MCG/2ML IJ SOLN
INTRAMUSCULAR | Status: AC
  Administered 2021-03-12: 50 ug via INTRAVENOUS
  Filled 2021-03-12: qty 2

## 2021-03-12 MED ORDER — LORATADINE 10 MG PO TABS
10.0000 mg | ORAL_TABLET | Freq: Every day | ORAL | Status: DC
  Administered 2021-03-13: 10 mg via ORAL
  Filled 2021-03-12: qty 1

## 2021-03-12 MED ORDER — ONDANSETRON HCL 4 MG/2ML IJ SOLN: 4.0000 mg | Freq: Once | INTRAMUSCULAR | Status: DC | PRN

## 2021-03-12 MED ORDER — OXYBUTYNIN CHLORIDE 5 MG PO TABS
5.0000 mg | ORAL_TABLET | Freq: Every day | ORAL | Status: DC
  Administered 2021-03-12: 5 mg via ORAL
  Filled 2021-03-12 (×2): qty 1

## 2021-03-12 MED ORDER — LACTATED RINGERS IV SOLN: INTRAVENOUS | Status: DC

## 2021-03-12 MED ORDER — ALBUTEROL SULFATE HFA 108 (90 BASE) MCG/ACT IN AERS
2.0000 | INHALATION_SPRAY | Freq: Four times a day (QID) | RESPIRATORY_TRACT | Status: DC | PRN
  Filled 2021-03-12: qty 6.7

## 2021-03-12 MED ORDER — ONDANSETRON HCL 4 MG/2ML IJ SOLN: 4.0000 mg | Freq: Four times a day (QID) | INTRAMUSCULAR | Status: DC | PRN

## 2021-03-12 MED ORDER — CLINDAMYCIN PHOSPHATE 600 MG/50ML IV SOLN
INTRAVENOUS | Status: AC
  Administered 2021-03-13: 600 mg via INTRAVENOUS
  Filled 2021-03-12: qty 50

## 2021-03-12 MED ORDER — LIDOCAINE HCL (CARDIAC) PF 100 MG/5ML IV SOSY
PREFILLED_SYRINGE | INTRAVENOUS | Status: DC | PRN
  Administered 2021-03-12: 100 mg via INTRAVENOUS

## 2021-03-12 MED ORDER — FENTANYL CITRATE (PF) 100 MCG/2ML IJ SOLN
25.0000 ug | INTRAMUSCULAR | Status: DC | PRN
  Administered 2021-03-12: 50 ug via INTRAVENOUS

## 2021-03-12 MED ORDER — ORAL CARE MOUTH RINSE: 15.0000 mL | Freq: Once | OROMUCOSAL | Status: DC

## 2021-03-12 MED ORDER — AMLODIPINE BESYLATE 5 MG PO TABS
2.5000 mg | ORAL_TABLET | Freq: Every day | ORAL | Status: DC
  Administered 2021-03-13: 2.5 mg via ORAL
  Filled 2021-03-12: qty 1

## 2021-03-12 MED ORDER — BUPROPION HCL ER (SR) 100 MG PO TB12
100.0000 mg | ORAL_TABLET | Freq: Every day | ORAL | Status: DC
  Administered 2021-03-12: 100 mg via ORAL
  Filled 2021-03-12 (×2): qty 1

## 2021-03-12 MED ORDER — MORPHINE SULFATE (PF) 2 MG/ML IV SOLN: 0.5000 mg | INTRAVENOUS | Status: DC | PRN

## 2021-03-12 MED ORDER — BUPROPION HCL ER (SR) 150 MG PO TB12
150.0000 mg | ORAL_TABLET | Freq: Every day | ORAL | Status: DC
  Administered 2021-03-13: 150 mg via ORAL
  Filled 2021-03-12: qty 1

## 2021-03-12 MED ORDER — HYDROCODONE-ACETAMINOPHEN 5-325 MG PO TABS
1.0000 | ORAL_TABLET | ORAL | Status: DC | PRN
  Administered 2021-03-12: 2 via ORAL
  Filled 2021-03-12: qty 2

## 2021-03-12 MED ORDER — POLYETHYLENE GLYCOL 3350 17 G PO PACK: 17.0000 g | PACK | Freq: Every day | ORAL | Status: DC | PRN

## 2021-03-12 MED ORDER — AMITRIPTYLINE HCL 50 MG PO TABS
50.0000 mg | ORAL_TABLET | Freq: Every day | ORAL | Status: DC
  Administered 2021-03-12: 50 mg via ORAL
  Filled 2021-03-12 (×2): qty 1
  Filled 2021-03-12: qty 2

## 2021-03-12 MED ORDER — DOCUSATE SODIUM 100 MG PO CAPS
100.0000 mg | ORAL_CAPSULE | Freq: Two times a day (BID) | ORAL | Status: DC
  Administered 2021-03-12 – 2021-03-13 (×2): 100 mg via ORAL
  Filled 2021-03-12 (×2): qty 1

## 2021-03-12 MED ORDER — CHLORHEXIDINE GLUCONATE 0.12 % MT SOLN: 15.0000 mL | Freq: Once | OROMUCOSAL | Status: DC

## 2021-03-12 MED ORDER — SODIUM CHLORIDE 0.9 % IR SOLN: Status: DC | PRN

## 2021-03-12 MED ORDER — PROPOFOL 10 MG/ML IV BOLUS
INTRAVENOUS | Status: DC | PRN
  Administered 2021-03-12: 120 mg via INTRAVENOUS
  Administered 2021-03-12: 50 mg via INTRAVENOUS

## 2021-03-12 MED ORDER — ALBUTEROL SULFATE (2.5 MG/3ML) 0.083% IN NEBU: 2.5000 mg | INHALATION_SOLUTION | RESPIRATORY_TRACT | Status: DC | PRN

## 2021-03-12 SURGICAL SUPPLY — 45 items
APL PRP STRL LF DISP 70% ISPRP (MISCELLANEOUS) ×1
BIT DRILL 2 FAST STEP (BIT) ×1 IMPLANT
BIT DRILL 2.5X4 QC (BIT) ×1 IMPLANT
BNDG ELASTIC 3X5.8 VLCR STR LF (GAUZE/BANDAGES/DRESSINGS) ×1 IMPLANT
BNDG ELASTIC 4X5.8 VLCR STR LF (GAUZE/BANDAGES/DRESSINGS) ×1 IMPLANT
CHLORAPREP W/TINT 26 (MISCELLANEOUS) ×2 IMPLANT
CUFF TOURN SGL QUICK 18X4 (TOURNIQUET CUFF) IMPLANT
DRAPE FLUOR MINI C-ARM 54X84 (DRAPES) ×2 IMPLANT
ELECT REM PT RETURN 9FT ADLT (ELECTROSURGICAL) ×2
ELECTRODE REM PT RTRN 9FT ADLT (ELECTROSURGICAL) ×1 IMPLANT
GAUZE 4X4 16PLY ~~LOC~~+RFID DBL (SPONGE) ×2 IMPLANT
GAUZE SPONGE 4X4 12PLY STRL (GAUZE/BANDAGES/DRESSINGS) ×2 IMPLANT
GAUZE XEROFORM 1X8 LF (GAUZE/BANDAGES/DRESSINGS) ×4 IMPLANT
GLOVE SURG SYN 9.0  PF PI (GLOVE) ×2
GLOVE SURG SYN 9.0 PF PI (GLOVE) ×1 IMPLANT
GOWN SRG 2XL LVL 4 RGLN SLV (GOWNS) ×1 IMPLANT
GOWN STRL NON-REIN 2XL LVL4 (GOWNS) ×2
GOWN STRL REUS W/ TWL LRG LVL3 (GOWN DISPOSABLE) ×1 IMPLANT
GOWN STRL REUS W/TWL LRG LVL3 (GOWN DISPOSABLE) ×2
K-WIRE 1.6 (WIRE) ×2
K-WIRE FX5X1.6XNS BN SS (WIRE) ×1
KIT TURNOVER KIT A (KITS) ×2 IMPLANT
KWIRE FX5X1.6XNS BN SS (WIRE) IMPLANT
MANIFOLD NEPTUNE II (INSTRUMENTS) ×2 IMPLANT
NDL FILTER BLUNT 18X1 1/2 (NEEDLE) ×1 IMPLANT
NEEDLE FILTER BLUNT 18X 1/2SAF (NEEDLE) ×1
NEEDLE FILTER BLUNT 18X1 1/2 (NEEDLE) ×1 IMPLANT
NS IRRIG 500ML POUR BTL (IV SOLUTION) ×2 IMPLANT
PACK EXTREMITY ARMC (MISCELLANEOUS) ×2 IMPLANT
PAD CAST CTTN 4X4 STRL (SOFTGOODS) ×2 IMPLANT
PADDING CAST COTTON 4X4 STRL (SOFTGOODS) ×4
PEG SUBCHONDRAL SMOOTH 2.0X16 (Peg) ×1 IMPLANT
PEG SUBCHONDRAL SMOOTH 2.0X20 (Peg) ×1 IMPLANT
PEG SUBCHONDRAL SMOOTH 2.0X22 (Peg) ×3 IMPLANT
PEG SUBCHONDRAL SMOOTH 2.0X24 (Peg) ×1 IMPLANT
PLATE SHORT 21.6X48.9 NRRW RT (Plate) ×1 IMPLANT
SCALPEL PROTECTED #15 DISP (BLADE) ×4 IMPLANT
SCREW CORT 3.5X10 LNG (Screw) ×3 IMPLANT
SPLINT CAST 1 STEP 3X12 (MISCELLANEOUS) ×2 IMPLANT
SUT ETHILON 4-0 (SUTURE) ×2
SUT ETHILON 4-0 FS2 18XMFL BLK (SUTURE) ×1
SUT VICRYL 3-0 27IN (SUTURE) ×2 IMPLANT
SUTURE ETHLN 4-0 FS2 18XMF BLK (SUTURE) ×1 IMPLANT
SYR 3ML LL SCALE MARK (SYRINGE) ×2 IMPLANT
WATER STERILE IRR 500ML POUR (IV SOLUTION) ×2 IMPLANT

## 2021-03-12 NOTE — H&P (Signed)
Chief Complaint  Patient presents with   Right wrist fracture   Patricia Gomez is a 75 y.o. female who presents today for evaluation of a right wrist fracture. Patient states last week on Thursday, March 04, 2021 she fell in her kitchen at her apartment and injured her right wrist. She was seen at [facility] where she was found to have a displaced impacted distal radial fracture with comminution. She was advised to undergo surgery but did not want to have surgery in [place] so she scheduled an appointment to see [place]. She has been wearing a splint. She takes tramadol 2 tablets twice daily at baseline for chronic back pain. She has been also taking additional Tylenol. Pain has been moderate to severe but has been slowly improving. She denies any numbness or tingling in the right upper extremity.  Past Medical History: Past Medical History:  Diagnosis Date   COPD (chronic obstructive pulmonary disease) , unspecified (CMS-HCC)   GERD (gastroesophageal reflux disease)   Hypertension   Migraines   Past Surgical History: Past Surgical History:  Procedure Laterality Date   HYSTERECTOMY  75 yrs old   Interposition arthropalsty left thumb CMC joint. Excision of left volar carpal gangion. Left 05/21/2015   Left Total Knee Arthoplasty  Dr. Ernest Pine   Ovary Removed   Right Total Knee Arthoplasty  Dr. Ernest Pine   Past Family History: Family History  Problem Relation Age of Onset   No Known Problems Mother   No Known Problems Father   Medications: Current Outpatient Medications Ordered in Epic  Medication Sig Dispense Refill   albuterol 90 mcg/actuation inhaler Inhale into the lungs. Inhale 2 puffs into the lungs every 6 (six) hours as needed for wheezing or shortness of breath.   aspirin 81 MG EC tablet Take by mouth. Take 81 mg by mouth daily.   benazepril (LOTENSIN) 40 MG tablet 40 mg tablet daily.   ferrous sulfate 325 (65 FE) MG tablet Take 325 mg by mouth once daily. 0   hydrOXYzine  (ATARAX) 25 MG tablet take 1 tablet by mouth three times a day if needed for MUSCLE CRAMPS 0   omeprazole (PRILOSEC) 20 MG DR capsule Take by mouth. Take 20 mg by mouth daily.   ondansetron (ZOFRAN) 4 MG tablet Take by mouth.   propranolol (INDERAL) 10 MG tablet Take 10 mg by mouth 3 (three) times daily. 0   traMADol (ULTRAM) 50 mg tablet Take by mouth. Take 1 tablet (50 mg total) by mouth 3 (three) times daily. 1-2 tablets   traMADoL (ULTRAM) 50 mg tablet Take 1-2 tablets (50-100 mg total) by mouth every 6 (six) hours as needed for Pain for up to 5 days 30 tablet 0   VITAMIN D2 50,000 unit capsule Take 50,000 Units by mouth once a week. 0   No current Epic-ordered facility-administered medications on file.   Allergies: Allergies  Allergen Reactions   Metronidazole Nausea And Vomiting   Prednisone Diarrhea and Nausea And Vomiting   Penicillins Rash  Told by parent    Review of Systems:  A comprehensive 14 point ROS was performed, reviewed by me today, and the pertinent orthopaedic findings are documented in the HPI.  Exam: BP (!) 160/82  General:  Well developed, well nourished, no apparent distress, normal affect, nonantalgic gait with a walker  HEENT: Head normocephalic, atraumatic, PERRL.   Abdomen: Soft, non tender, non distended, Bowel sounds present.  Heart: Examination of the heart reveals regular, rate, and rhythm. There is no murmur  noted on ascultation. There is a normal apical pulse.  Lungs: Lungs are clear to auscultation. There is no wheeze, rhonchi, or crackles. There is normal expansion of bilateral chest walls.   Right upper extremity: Examination of the right upper extremity shows mild swelling and bruising. She is able to make a full fist. Sensation is intact distally. 2+ radial pulse and 2+ cap refill. She is tender along the distal radius. No tenderness along the distal ulna. No tenderness throughout the carpals or phalanges. She has good range of motion of  the elbow. No skin breakdown noted.  AP lateral and oblique views of the right wrist are ordered and interpreted by me in the office today. Impression: Patient has shortened/impacted distal radial metaphyseal fracture. There is comminution of the distal radius that does extend intra-articularly. There is dorsal angulation of the distal radial metaphyseal fracture. Patient has degenerative changes of the [facility] and scaphotrapezial joint. No evidence of ulnar fracture. No other evidence of acute bony abnormality.  Impression: Other closed intra-articular fracture of distal end of right radius, initial encounter [S52.571A] Displaced closed intra-articular fracture of distal end of right radius, initial encounter (primary encounter diagnosis)  Plan:  1. 75 year old female with right distal radius fracture with displacement and comminution. She lives in an apartment by herself and lives independently. Due to severity of displacement, patient has agreed and consented to a right distal radius ORIF. Risks, benefits, complications of a right distal radius ORIF have been discussed with the patient. Patient has agreed and consented procedure with Dr. Kennedy Bucker. We will try to schedule this procedure for tomorrow 03/09/2021. Today, patient is placed into a Velcro wrist brace. Patient will continue with Tylenol and tramadol  This note was generated in part with voice recognition software and I apologize for any typographical errors that were not detected and corrected.  Patience Musca MPA-C   Electronically signed by Patience Musca, PA at 03/08/2021 3:28 PM EDT  Reviewed  H+P. No changes noted.

## 2021-03-12 NOTE — Anesthesia Preprocedure Evaluation (Signed)
Anesthesia Evaluation  Patient identified by MRN, date of birth, ID band Patient awake    Reviewed: Allergy & Precautions, H&P , NPO status , Patient's Chart, lab work & pertinent test results, reviewed documented beta blocker date and time   History of Anesthesia Complications Negative for: history of anesthetic complications  Airway Mallampati: II  TM Distance: >3 FB Neck ROM: full    Dental  (+) Dental Advidsory Given, Edentulous Upper, Edentulous Lower   Pulmonary shortness of breath, asthma , neg sleep apnea, COPD,  COPD inhaler, neg recent URI, former smoker,    Pulmonary exam normal breath sounds clear to auscultation       Cardiovascular Exercise Tolerance: Good hypertension, (-) angina(-) Past MI and (-) Cardiac Stents Normal cardiovascular exam(-) dysrhythmias (-) Valvular Problems/Murmurs Rhythm:regular Rate:Normal     Neuro/Psych PSYCHIATRIC DISORDERS Anxiety Depression negative neurological ROS     GI/Hepatic Neg liver ROS, GERD  ,  Endo/Other  negative endocrine ROS  Renal/GU CRFRenal disease  negative genitourinary   Musculoskeletal   Abdominal   Peds  Hematology negative hematology ROS (+)   Anesthesia Other Findings Past Medical History: No date: Anemia No date: Anxiety No date: Asthma No date: Chronic kidney disease No date: COPD (chronic obstructive pulmonary disease) (HCC) No date: Depression 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 No date: GERD (gastroesophageal reflux disease) No date: Hx of migraines No date: Hyperlipidemia No date: Hypertension No date: Incontinence No date: Neuropathy No date: Osteopenia No date: Panic attack No date: Shortness of breath dyspnea No date: Wheezing   Reproductive/Obstetrics negative OB ROS                             Anesthesia Physical Anesthesia Plan  ASA: 3  Anesthesia Plan: General   Post-op Pain  Management:  Regional for Post-op pain   Induction: Intravenous  PONV Risk Score and Plan: 3 and Ondansetron, Dexamethasone and Treatment may vary due to age or medical condition  Airway Management Planned: LMA and Oral ETT  Additional Equipment:   Intra-op Plan:   Post-operative Plan: Extubation in OR  Informed Consent: I have reviewed the patients History and Physical, chart, labs and discussed the procedure including the risks, benefits and alternatives for the proposed anesthesia with the patient or authorized representative who has indicated his/her understanding and acceptance.     Dental Advisory Given  Plan Discussed with: Anesthesiologist, CRNA and Surgeon  Anesthesia Plan Comments:         Anesthesia Quick Evaluation

## 2021-03-12 NOTE — Anesthesia Postprocedure Evaluation (Signed)
Anesthesia Post Note  Patient: Patricia Gomez  Procedure(s) Performed: OPEN REDUCTION INTERNAL FIXATION (ORIF) DISTAL RADIAL FRACTURE (Right)  Patient location during evaluation: PACU Anesthesia Type: General Level of consciousness: awake and alert Pain management: pain level controlled Vital Signs Assessment: post-procedure vital signs reviewed and stable Respiratory status: spontaneous breathing, nonlabored ventilation, respiratory function stable and patient connected to nasal cannula oxygen Cardiovascular status: blood pressure returned to baseline and stable Postop Assessment: no apparent nausea or vomiting Anesthetic complications: no   No notable events documented.   Last Vitals:  Vitals:   03/12/21 1424 03/12/21 1500  BP: (!) 154/72   Pulse: 80   Resp: 16   Temp: (!) 36.3 C   SpO2: 93% 94%    Last Pain:  Vitals:   03/12/21 1518  TempSrc:   PainSc: 5                  Lenard Simmer

## 2021-03-12 NOTE — Transfer of Care (Signed)
Immediate Anesthesia Transfer of Care Note  Patient: Patricia Gomez  Procedure(s) Performed: OPEN REDUCTION INTERNAL FIXATION (ORIF) DISTAL RADIAL FRACTURE (Right)  Patient Location: PACU  Anesthesia Type:General  Level of Consciousness: awake  Airway & Oxygen Therapy: Patient Spontanous Breathing  Post-op Assessment: Report given to RN  Post vital signs: stable  Last Vitals:  Vitals Value Taken Time  BP 169/71 03/12/21 1321  Temp    Pulse 89 03/12/21 1324  Resp 13 03/12/21 1324  SpO2 91 % 03/12/21 1324  Vitals shown include unvalidated device data.  Last Pain:  Vitals:   03/12/21 1147  TempSrc: Oral  PainSc: 9       Patients Stated Pain Goal: 3 (03/12/21 1147)  Complications: No notable events documented.

## 2021-03-12 NOTE — Anesthesia Procedure Notes (Signed)
Procedure Name: LMA Insertion Date/Time: 03/12/2021 12:25 PM Performed by: Jannet Mantis, CRNA Pre-anesthesia Checklist: Patient identified, Patient being monitored, Timeout performed, Emergency Drugs available and Suction available Patient Re-evaluated:Patient Re-evaluated prior to induction Oxygen Delivery Method: Circle system utilized Preoxygenation: Pre-oxygenation with 100% oxygen Induction Type: IV induction Ventilation: Mask ventilation without difficulty LMA: LMA inserted LMA Size: 3.5 Tube type: Oral Number of attempts: 1 Placement Confirmation: positive ETCO2 and breath sounds checked- equal and bilateral Tube secured with: Tape Dental Injury: Teeth and Oropharynx as per pre-operative assessment

## 2021-03-12 NOTE — Op Note (Signed)
03/12/2021  1:24 PM  PATIENT:  Patricia Gomez  75 y.o. female  PRE-OPERATIVE DIAGNOSIS:  Other closed intra-articular fracture of distal end of right radius, initial encounter S52.571A  POST-OPERATIVE DIAGNOSIS:  Other closed intra-articular fracture of distal end of right radius, initial encounter S52.571A  PROCEDURE:  Procedure(s): OPEN REDUCTION INTERNAL FIXATION (ORIF) DISTAL RADIAL FRACTURE (Right)  SURGEON: Leitha Schuller, MD  ASSISTANTS: None  ANESTHESIA:   general  EBL:  Total I/O In: 250 [I.V.:250] Out: 5 [Blood:5]  BLOOD ADMINISTERED:none  DRAINS: none   LOCAL MEDICATIONS USED:  NONE  SPECIMEN:  No Specimen  DISPOSITION OF SPECIMEN:  N/A  COUNTS:  YES  TOURNIQUET: 14 minutes at 275   IMPLANTS: [employer] DVR short narrow right plate with multiple pegs and screws  DICTATION: .Dragon Dictation patient was brought to the operating room and after adequate general anesthesia was obtained the right arm was prepped and draped in the usual sterile fashion.  After patient identification and timeout procedures were completed tourniquet was raised and a volar approach was utilized there was excessive bleeding so the tourniquet was let down and subsequently raised later with pressure raised to 275 and this worked better.  Incision was centered over the FCR tendon tendon sheath was incised the tendon retracted radially pronator elevated off the distal and proximal fragments.  After getting a exposure with traction applied off the end of the table 10 pounds of traction most of the length was restored.  With additional traction near and anatomic length was restored.  A volar plate was then applied and pinned in the appropriate position.  The smooth pegs were all placed making sure not to penetrate the joint with care being taken to get good oblique radial inclination views.  Next the cortical screws were placed drilling measuring and placing the cortical screws care being  taken not to penetrate the dorsal cortex.  This gave near-anatomic alignment.  Traction was removed and on range of motion the fracture was stable.  The wound was thoroughly irrigated and tourniquet let down the wound was closed with 3-0 Vicryl subcutaneously and 4-0 nylon for the skin followed by Xeroform 4 x 4's web roll volar splint and Ace wrap  PLAN OF CARE: Admit for overnight observation  PATIENT DISPOSITION:  PACU - hemodynamically stable.

## 2021-03-13 DIAGNOSIS — S52571A Other intraarticular fracture of lower end of right radius, initial encounter for closed fracture: Secondary | ICD-10-CM | POA: Diagnosis not present

## 2021-03-13 MED ORDER — HYDROCODONE-ACETAMINOPHEN 5-325 MG PO TABS: 1.0000 | ORAL_TABLET | ORAL | 0 refills | Status: DC | PRN

## 2021-03-13 NOTE — Progress Notes (Signed)
Discharge instructions were reviewed with the patient. She had no further questions. I removed her PIV and assisted in getting her dressed. Rx is in the discharge paperwork and patient is aware. She will call her son to come pick her up.

## 2021-03-13 NOTE — Discharge Instructions (Signed)
Diet: As you were doing prior to hospitalization   Shower: Keep right upper extremity splint clean and dry at all times.  Dressing:  You may change your dressing as needed. Change the dressing with sterile gauze dressing.    Activity:  Increase activity slowly as tolerated, but follow the weight bearing instructions below.  No lifting or driving with right upper extremity x6 weeks.  Weight Bearing:   No weightbearing right upper extremity.  To prevent constipation: you may use a stool softener such as -  Colace (over the counter) 100 mg by mouth twice a day  Drink plenty of fluids (prune juice may be helpful) and high fiber foods Miralax (over the counter) for constipation as needed.    Itching:  If you experience itching with your medications, try taking only a single pain pill, or even half a pain pill at a time.  You may take up to 10 pain pills per day, and you can also use benadryl over the counter for itching or also to help with sleep.   Precautions:  If you experience chest pain or shortness of breath - call 911 immediately for transfer to the hospital emergency department!!  If you develop a fever greater that 101 F, purulent drainage from wound, increased redness or drainage from wound, or calf pain-Call [facility]                                              Follow- Up Appointment:  Please call for an appointment to be seen in next week at [facility].  This appointment should already be scheduled.

## 2021-03-13 NOTE — Evaluation (Addendum)
Physical Therapy Evaluation Patient Details Name: Patricia Gomez MRN: [id number] DOB: [birth year] Today's Date: 03/13/2021  History of Present Illness  75 yo Female reports falling at home with subsequent RUE distal radial fracture (displaced and impacted). She came to ED and is s/p ORIF on 03/12/21; PMH significant: COPD, GERD, HTN, Migraines; Patient is NWB is RUE; She [facility];  Clinical Impression  75 yo Female s/p fall at home with RUE radial fracture. Patient lives at home alone. She reports being mod I for all self care including cooking/cleaning. At first she states she was using a cane but then reported over last several months she has been walking with rollator due to intermittent back pain; She states that she gets services through [facility] and has been going there 1x a month. Currently she is NWB in RUE. She is Right hand dominant. Patient was mod I for bed mobility. She does require CGA for sit<>Stand transfer using RUE platform walker. (PT confirmed with Dr. Joice Lofts, patient is ok to use right platform walker); She was able to walk 15 feet with walker with CGA for safety. She did require cues for proper positioning of walker including to stay inside walker so that her RUE was positioned properly to avoid weight bearing in distal UE. Patient would benefit from additional skilled PT Intervention to improve strength, balance and mobility; She will need a platform walker at home. She reports she has a RW, but she has no one who could bring it to the hospital to see if it would allow for a platform attachment. Concerned about patient ability to care for self at home given that her dominant hand is NWB.She reports she has no one who could stay with her or help her. However when PT expressed concerns, patient stated, "I can care for myself. I'm going home."     During PT evaluation, vitals assessed, while sitting edge of bed, SPo2 87-90%, HR 88; RN notified; RN reports pt has  COPD. She did apply 2L O2 nasal canula. After patient ambulated, SPo2 95% while on supplemental O2     Recommendations for follow up therapy are one component of a multi-disciplinary discharge planning process, led by the attending physician.  Recommendations may be updated based on patient status, additional functional criteria and insurance authorization.  Follow Up Recommendations Home health PT    Equipment Recommendations  Other (comment) (platform RW (right))    Recommendations for Other Services       Precautions / Restrictions Precautions Precautions: Fall Precaution Comments: RUE NWB Restrictions Weight Bearing Restrictions: Yes RUE Weight Bearing: Non weight bearing      Mobility  Bed Mobility Overal bed mobility: Modified Independent             General bed mobility comments: does use bedrail and elevated head of bed; able to roll and transition supine to sit mod I; increase time/effort required; able to maintain RUE NWB;    Transfers Overall transfer level: Needs assistance Equipment used: Right platform walker Transfers: Sit to/from Stand Sit to Stand: Min guard         General transfer comment: with mod VCs for RUE placement to avoid weight bearing;  Ambulation/Gait Ambulation/Gait assistance: Min guard Gait Distance (Feet): 15 Feet Assistive device: Right platform walker Gait Pattern/deviations: Step-through pattern;Trunk flexed Gait velocity: decreased   General Gait Details: requires cues to stay close to RW for better safety awareness; patient often pushes walker in front and ambulates  with short shuffled steps;  Stairs            Wheelchair Mobility    Modified Rankin (Stroke Patients Only)       Balance Overall balance assessment: Mild deficits observed, not formally tested;History of Falls;Needs assistance Sitting-balance support: No upper extremity supported;Feet supported Sitting balance-Leahy Scale: Good     Standing  balance support: Bilateral upper extremity supported;During functional activity Standing balance-Leahy Scale: Fair Standing balance comment: requires CGA in standing for balance when walking;                             Pertinent Vitals/Pain Pain Assessment: 0-10 Pain Score: 6  Pain Location: RUE Pain Descriptors / Indicators: Aching;Sore Pain Intervention(s): Limited activity within patient's tolerance;Monitored during session;Repositioned    Home Living Family/patient expects to be discharged to:: Private residence Living Arrangements: Alone   Type of Home: Apartment Home Access: Level entry     Home Layout: One level Home Equipment: Walker - 2 wheels;Walker - 4 wheels;Wheelchair - manual;Grab bars - toilet;Grab bars - tub/shower;Shower seat;Cane - single point Additional Comments: Reports using rollator lately due to intermittent back pain;    Prior Function Level of Independence: Independent with assistive device(s)               Hand Dominance   Dominant Hand: Right    Extremity/Trunk Assessment   Upper Extremity Assessment Upper Extremity Assessment: RUE deficits/detail;LUE deficits/detail RUE Deficits / Details: Not formally assessed; shoulder ROM is WFL LUE Deficits / Details: WNL    Lower Extremity Assessment Lower Extremity Assessment: Overall WFL for tasks assessed    Cervical / Trunk Assessment Cervical / Trunk Assessment: Normal  Communication   Communication: No difficulties  Cognition Arousal/Alertness: Awake/alert Behavior During Therapy: WFL for tasks assessed/performed Overall Cognitive Status: Within Functional Limits for tasks assessed                                        General Comments      Exercises     Assessment/Plan    PT Assessment Patient needs continued PT services  PT Problem List Decreased mobility;Decreased safety awareness;Decreased activity tolerance;Cardiopulmonary status limiting  activity;Decreased balance       PT Treatment Interventions DME instruction;Gait training;Functional mobility training;Therapeutic activities;Therapeutic exercise;Balance training;Neuromuscular re-education;Patient/family education    PT Goals (Current goals can be found in the Care Plan section)  Acute Rehab PT Goals Patient Stated Goal: to go home PT Goal Formulation: With patient Time For Goal Achievement: 03/27/21 Potential to Achieve Goals: Good    Frequency Min 2X/week   Barriers to discharge Decreased caregiver support      Co-evaluation               AM-PAC PT "6 Clicks" Mobility  Outcome Measure Help needed turning from your back to your side while in a flat bed without using bedrails?: None Help needed moving from lying on your back to sitting on the side of a flat bed without using bedrails?: A Little Help needed moving to and from a bed to a chair (including a wheelchair)?: A Little Help needed standing up from a chair using your arms (e.g., wheelchair or bedside chair)?: A Little Help needed to walk in hospital room?: A Little Help needed climbing 3-5 steps with a railing? : A Little 6 Click Score: 19  End of Session Equipment Utilized During Treatment: Gait belt Activity Tolerance: Patient tolerated treatment well;No increased pain Patient left: in chair;with call bell/phone within reach;with chair alarm set Nurse Communication: Mobility status;Other (comment) (educated RN on O2 sats;) PT Visit Diagnosis: Unsteadiness on feet (R26.81);History of falling (Z91.81);Difficulty in walking, not elsewhere classified (R26.2)    Time: 6789-3810 (PT started evaluation 10:21-10:36, and then messaged MD for order clarification, completed evaluation 11:00-11:33)  PT Time Calculation (min) (ACUTE ONLY): 72 min   Charges:   PT Evaluation $PT Eval Low Complexity: 1 Low            Angel Hobdy, PT, DPT 03/13/2021, 12:54 PM

## 2021-03-13 NOTE — Progress Notes (Signed)
   Subjective: 1 Day Post-Op Procedure(s) (LRB): OPEN REDUCTION INTERNAL FIXATION (ORIF) DISTAL RADIAL FRACTURE (Right) Patient reports pain as mild and moderate.   Patient is well, and has had no acute complaints or problems Denies any CP, SOB, ABD pain. We will continue therapy today.  Plan is to go Home after hospital stay.  Objective: Vital signs in last 24 hours: Temp:  [97.4 F (36.3 C)-98.2 F (36.8 C)] 98.2 F (36.8 C) (10/01 0813) Pulse Rate:  [71-90] 90 (10/01 0813) Resp:  [13-20] 15 (10/01 0813) BP: (107-169)/(60-93) 138/60 (10/01 0813) SpO2:  [90 %-94 %] 93 % (10/01 0413) Weight:  [90.7 kg] 90.7 kg (09/30 1147)  Intake/Output from previous day: 09/30 0701 - 10/01 0700 In: 1799.8 [P.O.:580; I.V.:1069.8; IV Piggyback:150] Out: 5 [Blood:5] Intake/Output this shift: Total I/O In: 266.6 [I.V.:266.6] Out: -   Recent Labs    03/12/21 1148  HGB 14.3   Recent Labs    03/12/21 1148  HCT 42.0   Recent Labs    03/12/21 1148  NA 139  K 3.7  CL 101  BUN 8  CREATININE 0.70  GLUCOSE 98   No results for input(s): LABPT, INR in the last 72 hours.  EXAM General - Patient is Alert, Appropriate, and Oriented Extremity - Neurovascular intact Sensation intact distally Intact pulses distally Splint intact right upper extremity.  Minimal swelling throughout the digits.  Close to making a full composite fist.  Neurovascular intact in right upper extremity. Dressing - dressing C/D/I and no drainage Motor Function - intact, moving digits well on exam.   Past Medical History:  Diagnosis Date   Anemia    Anxiety    Asthma    Chronic kidney disease    COPD (chronic obstructive pulmonary disease) (HCC)    Depression    DJD (degenerative joint disease) of knee    GERD (gastroesophageal reflux disease)    Hx of migraines    Hyperlipidemia    Hypertension    Incontinence    Neuropathy    Osteopenia    Panic attack    Shortness of breath dyspnea    Wheezing      Assessment/Plan:   1 Day Post-Op Procedure(s) (LRB): OPEN REDUCTION INTERNAL FIXATION (ORIF) DISTAL RADIAL FRACTURE (Right) Active Problems:   S/P ORIF (open reduction internal fixation) fracture  Estimated body mass index is 35.43 kg/m as calculated from the following:   Height as of this encounter: 5\' 3"  (1.6 m).   Weight as of this encounter: 90.7 kg. Advance diet Up with therapy, nonweightbearing right upper extremity Patient doing well.  Pain controlled.  Vital signs stable. Plan on discharge to home today.  Follow-up with [facility] orthopedics first of next week    [facility], PA-C [facility] 03/13/2021, 8:18 AM

## 2021-03-13 NOTE — Discharge Summary (Signed)
Physician Discharge Summary  Patient ID: Patricia Gomez MRN: [id number] DOB/AGE: [birth year] 76 y.o.  Admit date: 03/12/2021 Discharge date: 03/13/2021  Admission Diagnoses:  S/P ORIF (open reduction internal fixation) fracture [Z98.890, Z87.81]   Discharge Diagnoses: Patient Active Problem List   Diagnosis Date Noted   S/P ORIF (open reduction internal fixation) fracture 03/12/2021   Uncontrolled hypertension 02/06/2016   Chest pain 02/06/2016   COPD (chronic obstructive pulmonary disease) (HCC) 02/06/2016   GERD (gastroesophageal reflux disease) 02/06/2016   Pneumonia 09/15/2015   Anxiety 08/20/2015   Ganglion cyst of wrist 05/21/2015   Tendinitis of wrist 05/21/2015   Asthma 05/06/2015   CMC arthritis, thumb, degenerative 05/01/2015   Anemia    Hyperlipidemia    Depression    Osteopenia    Essential hypertension    Chronic kidney disease    Neuropathy    DJD (degenerative joint disease) of knee    Failed back surgical syndrome    Vaginal atrophy 12/16/2014   DDD (degenerative disc disease), cervical 10/21/2014   DDD (degenerative disc disease), lumbar 10/21/2014   Facet syndrome 10/21/2014   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 female who was admitted 03/12/2021 with a diagnosis of displaced comminuted intra-articular right distal radius fracture and  went to the operating room on 03/12/2021 and underwent the above named procedures.    Surgeries: Procedure(s): OPEN REDUCTION INTERNAL FIXATION (ORIF) DISTAL RADIAL FRACTURE on 03/12/2021 Patient tolerated the surgery well. Taken to PACU where she was stabilized and then transferred to the orthopedic floor.  Patient stayed overnight for pain control.  She was able to get up with physical therapy the next morning and vital signs are stable.  Pain well controlled.  Patient was stable and ready to discharge home She was given perioperative antibiotics:  Anti-infectives (From admission, onward)    Start     Dose/Rate Route Frequency Ordered Stop   03/12/21 1800  clindamycin (CLEOCIN) IVPB 600 mg        600 mg 100 mL/hr over 30 Minutes Intravenous Every 6 hours 03/12/21 1410 03/13/21 0625   03/12/21 1145  clindamycin (CLEOCIN) IVPB 600 mg        600 mg 100 mL/hr over 30 Minutes Intravenous  Once 03/12/21 1130 03/12/21 1221   03/12/21 1139  clindamycin (CLEOCIN) 600 MG/50ML IVPB       Note to Pharmacy: Patricia Gomez  : cabinet override      03/12/21 1139 03/13/21 0040     .  She benefited maximally from the hospital stay and there were no complications.    Recent vital signs:  Vitals:   03/13/21 0413 03/13/21 0813  BP: (!) 152/85 138/60  Pulse: 75 90  Resp: 17 15  Temp: 97.6  F (36.4 C) 98.2 F (36.8 C)  SpO2: 93%     Recent laboratory studies:  Lab Results  Component Value Date   HGB 14.3 03/12/2021   HGB 13.5 03/09/2021   HGB 11.1 (L) 02/07/2016   Lab Results  Component Value Date   WBC 8.5 03/09/2021   PLT 336 03/09/2021   No results found for: INR Lab Results  Component Value Date   NA 139 03/12/2021   K 3.7 03/12/2021   CL 101 03/12/2021   CO2 29 03/09/2021   BUN 8 03/12/2021   CREATININE 0.70 03/12/2021   GLUCOSE 98 03/12/2021    Discharge Medications:   Allergies as of 03/13/2021       Reactions   Penicillins Anaphylaxis, Rash, Other (See Comments)    Has patient had a PCN reaction causing immediate rash, facial/tongue/throat swelling, SOB or lightheadedness with hypotension: Yes Has patient had a PCN reaction causing severe rash involving mucus membranes or skin necrosis: No Has patient had a PCN reaction that required hospitalization No Has patient had a PCN reaction occurring within the last 10 years: No If all of the above answers are "NO", then may proceed with Cephalosporin use.   Flagyl [metronidazole] Nausea And Vomiting   Prednisone Diarrhea, Nausea And Vomiting        Medication List     TAKE these medications    acetaminophen 500 MG tablet Commonly known as: TYLENOL Take 500 mg by mouth in the morning, at noon, and at bedtime.   albuterol 108 (90 Base) MCG/ACT inhaler Commonly known as: VENTOLIN HFA Inhale 2 puffs into the lungs every 6 (six) hours as needed for wheezing or shortness of breath.   albuterol (2.5 MG/3ML) 0.083% nebulizer solution Commonly known as: PROVENTIL Take 2.5 mg by nebulization every 4 (four) hours as needed for wheezing or shortness of breath.   amitriptyline 50 MG tablet Commonly known as: ELAVIL Take 50 mg by mouth in the morning, at noon, and at bedtime.   amLODipine 2.5 MG tablet Commonly known as: NORVASC Take 2.5 mg by mouth daily.   aspirin EC 81 MG tablet Take 81 mg by mouth daily.   buPROPion 150 MG 12 hr tablet Commonly known as: WELLBUTRIN SR Take 150 mg by mouth daily.   buPROPion ER 100 MG 12 hr tablet Commonly known as: WELLBUTRIN SR Take 100 mg by mouth at bedtime.   Effexor XR 75 MG 24 hr capsule Generic drug: venlafaxine XR Take 75 mg by mouth in the morning and at bedtime.   ferrous sulfate 325 (65 FE) MG tablet Take 325 mg by mouth daily with breakfast.   fexofenadine 60 MG tablet Commonly known as: ALLEGRA Take 60 mg by mouth 2 (two) times daily.   HYDROcodone-acetaminophen 5-325 MG tablet Commonly known as: NORCO/VICODIN Take 1 tablet by mouth  every 4 (four) hours as needed for moderate pain (pain score 4-6).   ibuprofen 200 MG tablet Commonly known as: ADVIL Take 200 mg by mouth 3 (three) times daily.   omeprazole 20 MG capsule Commonly known as: PRILOSEC Take 20 mg by mouth daily.   ondansetron 4 MG disintegrating tablet Commonly known as: ZOFRAN-ODT Take 4 mg by mouth every 4 (four) hours as needed for nausea or vomiting.   oxybutynin 5 MG tablet Commonly known as: DITROPAN Take 5 mg by mouth at bedtime.   traMADol 50 MG tablet Commonly known as: ULTRAM Take 50 mg by mouth in the morning, at noon, in the evening,  and at bedtime.   triamcinolone cream 0.1 % Commonly known as: KENALOG Apply 1 application topically 2 (two) times daily as needed (sores).   Vitamin D (Ergocalciferol) 1.25 MG (50000 UNIT) Caps capsule Commonly known as: DRISDOL Take 50,000 Units by mouth every Sunday.        Diagnostic Studies: DG Wrist 2 Views Right  Result Date: 03/12/2021 CLINICAL DATA:  Distal radius fracture status post ORIF EXAM: RIGHT WRIST - 2 VIEW COMPARISON:  None. FINDINGS: Postsurgical changes from ORIF of a distal radial fracture via volar sideplate and screw fixation construct. There is slight radial displacement of the distal fracture. No significant angulation. No additional fractures are seen. Moderate arthropathy of the first [facility] and triscaphe joints. Diffuse soft tissue swelling about the wrist. Overlying splint material. IMPRESSION: Status post ORIF of a distal radial fracture with slight radial displacement of the distal fracture. No significant angulation. Electronically Signed   By: Duanne Guess D.O.   On: 03/12/2021 14:17   DG MINI C-ARM IMAGE ONLY  Result Date: 03/12/2021 There is no interpretation for this exam.  This order is for images obtained during a surgical procedure.  Please See "Surgeries" Tab for more information regarding the procedure.    Disposition: Discharge disposition: 01-Home or Self  Care          Follow-up Information     Evon Slack, PA-C Follow up.   Specialties: Orthopedic Surgery, Emergency Medicine Contact information: [address] [place] [address] [phone]                  Signed: Patience Musca 03/13/2021, 8:23 AM

## 2021-11-23 ENCOUNTER — Other Ambulatory Visit: Payer: Self-pay | Admitting: Internal Medicine

## 2021-11-23 DIAGNOSIS — Z13820 Encounter for screening for osteoporosis: Secondary | ICD-10-CM

## 2021-12-20 ENCOUNTER — Other Ambulatory Visit: Payer: Self-pay | Admitting: Internal Medicine

## 2021-12-20 ENCOUNTER — Other Ambulatory Visit: Payer: Medicaid Other

## 2021-12-20 DIAGNOSIS — Z13820 Encounter for screening for osteoporosis: Secondary | ICD-10-CM

## 2022-01-04 ENCOUNTER — Other Ambulatory Visit: Payer: Self-pay | Admitting: Internal Medicine

## 2022-01-04 DIAGNOSIS — R1011 Right upper quadrant pain: Secondary | ICD-10-CM

## 2022-01-14 ENCOUNTER — Ambulatory Visit
Admission: RE | Admit: 2022-01-14 | Discharge: 2022-01-14 | Disposition: A | Discharge status: Home / Self Care | Payer: Medicare Other | Source: Ambulatory Visit | Attending: Internal Medicine | Admitting: Internal Medicine

## 2022-01-14 DIAGNOSIS — R1011 Right upper quadrant pain: Secondary | ICD-10-CM | POA: Diagnosis not present

## 2022-01-20 ENCOUNTER — Other Ambulatory Visit: Payer: Medicare (Managed Care)

## 2022-02-15 ENCOUNTER — Other Ambulatory Visit: Payer: Self-pay | Admitting: Internal Medicine

## 2022-02-15 DIAGNOSIS — R1011 Right upper quadrant pain: Secondary | ICD-10-CM

## 2022-02-15 DIAGNOSIS — R748 Abnormal levels of other serum enzymes: Secondary | ICD-10-CM

## 2022-02-15 DIAGNOSIS — R1032 Left lower quadrant pain: Secondary | ICD-10-CM

## 2022-02-22 ENCOUNTER — Other Ambulatory Visit: Payer: Medicaid Other

## 2022-02-23 ENCOUNTER — Ambulatory Visit
Admission: RE | Admit: 2022-02-23 | Discharge: 2022-02-23 | Disposition: A | Discharge status: Home / Self Care | Payer: Medicare Other | Source: Ambulatory Visit | Attending: Internal Medicine | Admitting: Internal Medicine

## 2022-02-23 DIAGNOSIS — R1011 Right upper quadrant pain: Secondary | ICD-10-CM

## 2022-02-23 DIAGNOSIS — R748 Abnormal levels of other serum enzymes: Secondary | ICD-10-CM

## 2022-02-23 DIAGNOSIS — R1032 Left lower quadrant pain: Secondary | ICD-10-CM

## 2022-03-25 ENCOUNTER — Ambulatory Visit
Admission: RE | Admit: 2022-03-25 | Discharge: 2022-03-25 | Disposition: A | Discharge status: Home / Self Care | Payer: Medicaid Other | Source: Ambulatory Visit | Attending: Internal Medicine | Admitting: Internal Medicine

## 2022-03-25 DIAGNOSIS — Z13820 Encounter for screening for osteoporosis: Secondary | ICD-10-CM

## 2022-05-18 ENCOUNTER — Other Ambulatory Visit: Payer: Self-pay | Admitting: Internal Medicine

## 2022-05-18 DIAGNOSIS — R1310 Dysphagia, unspecified: Secondary | ICD-10-CM

## 2022-05-25 ENCOUNTER — Ambulatory Visit
Admission: RE | Admit: 2022-05-25 | Discharge: 2022-05-25 | Disposition: A | Discharge status: Home / Self Care | Payer: Medicare Other | Source: Ambulatory Visit | Attending: Internal Medicine | Admitting: Internal Medicine

## 2022-05-25 DIAGNOSIS — R1310 Dysphagia, unspecified: Secondary | ICD-10-CM | POA: Insufficient documentation

## 2022-07-21 ENCOUNTER — Ambulatory Visit
Admission: RE | Admit: 2022-07-21 | Discharge: 2022-07-21 | Disposition: A | Discharge status: Home / Self Care | Payer: Medicaid Other | Source: Ambulatory Visit | Attending: Internal Medicine | Admitting: Internal Medicine

## 2022-07-21 ENCOUNTER — Other Ambulatory Visit: Payer: Self-pay | Admitting: Internal Medicine

## 2022-07-21 DIAGNOSIS — M25512 Pain in left shoulder: Secondary | ICD-10-CM

## 2022-07-21 DIAGNOSIS — M79642 Pain in left hand: Secondary | ICD-10-CM

## 2022-07-21 DIAGNOSIS — M25532 Pain in left wrist: Secondary | ICD-10-CM

## 2022-07-25 ENCOUNTER — Other Ambulatory Visit: Payer: Self-pay | Admitting: Internal Medicine

## 2022-07-25 DIAGNOSIS — M79642 Pain in left hand: Secondary | ICD-10-CM

## 2022-07-26 ENCOUNTER — Ambulatory Visit
Admission: RE | Admit: 2022-07-26 | Discharge: 2022-07-26 | Disposition: A | Discharge status: Home / Self Care | Payer: Medicare HMO | Source: Ambulatory Visit | Attending: Internal Medicine | Admitting: Internal Medicine

## 2022-07-26 DIAGNOSIS — M79642 Pain in left hand: Secondary | ICD-10-CM

## 2022-09-12 ENCOUNTER — Other Ambulatory Visit
Admission: RE | Admit: 2022-09-12 | Discharge: 2022-09-12 | Disposition: A | Discharge status: Home / Self Care | Payer: Medicare HMO | Source: Ambulatory Visit | Attending: Ophthalmology | Admitting: Ophthalmology

## 2022-09-12 DIAGNOSIS — H02401 Unspecified ptosis of right eyelid: Secondary | ICD-10-CM | POA: Insufficient documentation

## 2022-09-12 LAB — SEDIMENTATION RATE: Sed Rate: 43 mm/hr — ABNORMAL HIGH (ref 0–30)

## 2022-09-12 LAB — C-REACTIVE PROTEIN: CRP: 1.6 mg/dL — ABNORMAL HIGH (ref <1.0)

## 2022-09-12 LAB — PLATELET COUNT: Platelets: 314 10*3/uL (ref 150–400)

## 2022-09-13 LAB — ACETYLCHOLINE RECEPTOR, BINDING: Acety choline binding ab: <0.03 nmol/L (ref 0.00–0.24)

## 2022-12-14 ENCOUNTER — Other Ambulatory Visit: Payer: Self-pay | Admitting: Internal Medicine

## 2022-12-14 DIAGNOSIS — R519 Headache, unspecified: Secondary | ICD-10-CM

## 2022-12-14 DIAGNOSIS — R269 Unspecified abnormalities of gait and mobility: Secondary | ICD-10-CM

## 2022-12-19 ENCOUNTER — Ambulatory Visit
Admission: RE | Admit: 2022-12-19 | Discharge: 2022-12-19 | Disposition: A | Discharge status: Home / Self Care | Payer: Medicare HMO | Source: Ambulatory Visit | Attending: Internal Medicine | Admitting: Internal Medicine

## 2022-12-19 DIAGNOSIS — R519 Headache, unspecified: Secondary | ICD-10-CM | POA: Diagnosis present

## 2022-12-19 DIAGNOSIS — R269 Unspecified abnormalities of gait and mobility: Secondary | ICD-10-CM | POA: Insufficient documentation

## 2023-04-14 ENCOUNTER — Ambulatory Visit: Payer: Medicare HMO | Attending: Cardiology | Admitting: Cardiology

## 2023-04-14 ENCOUNTER — Encounter: Payer: Self-pay | Admitting: Cardiology

## 2023-04-14 ENCOUNTER — Ambulatory Visit: Payer: Medicare HMO

## 2023-04-14 VITALS — BP 152/82 | HR 78 | Ht 62.0 in | Wt 178.6 lb

## 2023-04-14 DIAGNOSIS — I499 Cardiac arrhythmia, unspecified: Secondary | ICD-10-CM

## 2023-04-14 DIAGNOSIS — R072 Precordial pain: Secondary | ICD-10-CM | POA: Diagnosis not present

## 2023-04-14 DIAGNOSIS — I10 Essential (primary) hypertension: Secondary | ICD-10-CM | POA: Diagnosis not present

## 2023-04-14 MED ORDER — AMLODIPINE BESYLATE 5 MG PO TABS: 5.0000 mg | ORAL_TABLET | Freq: Every day | ORAL | 3 refills | Status: DC

## 2023-04-14 MED ORDER — METOPROLOL TARTRATE 100 MG PO TABS: 100.0000 mg | ORAL_TABLET | Freq: Once | ORAL | 0 refills | Status: DC

## 2023-04-14 NOTE — Patient Instructions (Addendum)
Medication Instructions:   STOP chlorthalidone (HYGROTON) 25 MG tablet  START Amlodipine - Take one tablet ( 5mg ) by mouth daily.   *If you need a refill on your cardiac medications before your next appointment, please call your pharmacy*   Lab Work:  Your physician recommends you have labs today -- BMP  If you have labs (blood work) drawn today and your tests are completely normal, you will receive your results only by: MyChart Message (if you have MyChart) OR A paper copy in the mail If you have any lab test that is abnormal or we need to change your treatment, we will call you to review the results.   Testing/Procedures:  Your physician has requested that you have an echocardiogram. Echocardiography is a painless test that uses sound waves to create images of your heart. It provides your doctor with information about the size and shape of your heart and how well your heart's chambers and valves are working. This procedure takes approximately one hour. There are no restrictions for this procedure. Please do NOT wear cologne, perfume, aftershave, or lotions (deodorant is allowed). Please arrive 15 minutes prior to your appointment time.  Please note: We ask at that you not bring children with you during ultrasound (echo/ vascular) testing. Due to room size and safety concerns, children are not allowed in the ultrasound rooms during exams. Our front office staff cannot provide observation of children in our lobby area while testing is being conducted. An adult accompanying a patient to their appointment will only be allowed in the ultrasound room at the discretion of the ultrasound technician under special circumstances. We apologize for any inconvenience.  Your physician has recommended that you wear a Zio monitor.   This monitor is a medical device that records the heart's electrical activity. Doctors most often use these monitors to diagnose arrhythmias. Arrhythmias are problems with  the speed or rhythm of the heartbeat. The monitor is a small device applied to your chest. You can wear one while you do your normal daily activities. While wearing this monitor if you have any symptoms to push the button and record what you felt. Once you have worn this monitor for the period of time provider prescribed (Usually 14 days), you will return the monitor device in the postage paid box. Once it is returned they will download the data collected and provide [place] with a report which the provider will then review and we will call you with those results. Important tips:  Avoid showering during the first 24 hours of wearing the monitor. Avoid excessive sweating to help maximize wear time. Do not submerge the device, no hot tubs, and no swimming pools. Keep any lotions or oils away from the patch. After 24 hours you may shower with the patch on. Take brief showers with your back facing the shower head.  Do not remove patch once it has been placed because that will interrupt data and decrease adhesive wear time. Push the button when you have any symptoms and write down what you were feeling. Once you have completed wearing your monitor, remove and place into box which has postage paid and place in your outgoing mailbox.  If for some reason you have misplaced your box then call our office and we can provide another box and/or mail it off for you.   Your cardiac CT will be scheduled at one of the below locations:   [facility] [address] [place], [place]  [address] [phone]  OR   [facility] and V[facility] entrance [address] [place], [place] [address] [phone]   If scheduled at [facility] or [facility], please arrive 15 mins early for check-in and test prep.  There is spacious parking and easy access to the radiology department from the [facility] Heart and Vascular entrance. Please  enter here and check-in with the desk attendant.   Please follow these instructions carefully (unless otherwise directed):  An IV will be required for this test and Nitroglycerin will be given.  Hold all erectile dysfunction medications at least 3 days (72 hrs) prior to test. (Ie viagra, cialis, sildenafil, tadalafil, etc)   On the Night Before the Test: Be sure to Drink plenty of water. Do not consume any caffeinated/decaffeinated beverages or chocolate 12 hours prior to your test. Do not take any antihistamines 12 hours prior to your test.  On the Day of the Test: Drink plenty of water until 1 hour prior to the test. Do not eat any food 1 hour prior to test. You may take your regular medications prior to the test.  Take metoprolol (Lopressor) two hours prior to test. If you take Furosemide/Hydrochlorothiazide/Spironolactone, please HOLD on the morning of the test. FEMALES- please wear underwire-free bra if available, avoid dresses & tight clothing  After the Test: Drink plenty of water. After receiving IV contrast, you may experience a mild flushed feeling. This is normal. On occasion, you may experience a mild rash up to 24 hours after the test. This is not dangerous. If this occurs, you can take Benadryl 25 mg and increase your fluid intake. If you experience trouble breathing, this can be serious. If it is severe call 911 IMMEDIATELY. If it is mild, please call our office. If you take any of these medications: Glipizide/Metformin, Avandament, Glucavance, please do not take 48 hours after completing test unless otherwise instructed.  We will call to schedule your test 2-4 weeks out understanding that some insurance companies will need an authorization prior to the service being performed.   For more information and frequently asked questions, please visit our website : [email or web address]  For non-scheduling related questions, please contact the cardiac imaging nurse  navigator should you have any questions/concerns: Cardiac Imaging Nurse Navigators Direct Office Dial: [phone]   For scheduling needs, including cancellations and rescheduling, please call [place], [phone].    Follow-Up: At [facility], you and your health needs are our priority.  As part of our continuing mission to provide you with exceptional heart care, we have created designated Provider Care Teams.  These Care Teams include your primary Cardiologist (physician) and Advanced Practice Providers (APPs -  Physician Assistants and Nurse Practitioners) who all work together to provide you with the care you need, when you need it.  We recommend signing up for the patient portal called "MyChart".  Sign up information is provided on this After Visit Summary.  MyChart is used to connect with patients for Virtual Visits (Telemedicine).  Patients are able to view lab/test results, encounter notes, upcoming appointments, etc.  Non-urgent messages can be sent to your provider as well.   To learn more about what you can do with MyChart, go to [email or web address].    Your next appointment:    After Cardiac testing ( 6-8 weeks)  Provider:   You may see Debbe Odea, MD or one of the following Advanced Practice Providers on your designated Care Team:   Nicolasa Ducking,  NP Eula Listen, PA-C Cadence Fransico Michael, PA-C Charlsie Quest, NP Carlos Levering, NP

## 2023-04-14 NOTE — Progress Notes (Signed)
Cardiology Office Note:    Date:  04/14/2023   ID:  Patricia Gomez, DOB 02/01/1946, MRN [id number]  PCP:  [facility]   [facility] Providers Cardiologist:  Debbe Odea, MD     Referring MD: [facility]   Chief Complaint  Patient presents with   New Patient (Initial Visit)    Referred for cardiac evaluation of chest pain.  Patient reports feeling of heart skipping.      History of Present Illness:    Patricia Gomez is a 77 y.o. female with a hx of hypertension, former smoker x 50 years who presents due to chest pain.  States having chest pain ongoing over the past several months.  Denies any relation of pain with exertion.  Describes pain as chest tightness.  Also endorses dizziness ongoing for at least 5 years now.  She felt dizzy in February/9 months ago, fell hitting her head.  States dizziness has worsened since then, changing positions makes dizziness worse, has an appointment with neurology upcoming.  She also endorses elevated blood pressures, has been on BP meds for several years now, previously on amlodipine, BP was well-controlled.  Blood pressure medications were switched, placed on losartan which caused GI upset.  Currently on chlorthalidone which patient states does not like taste.  Blood pressures are very erratic, currently ranges anywhere from low 100s to 170s systolic.  She noted elevated BP last week in the 200s.  Also endorsed having irregular heartbeat ongoing over 5 years now.  Prior notes/testing Echo 2017 EF 55 to 60%  Past Medical History:  Diagnosis Date   Anemia    Anxiety    Asthma    Chronic kidney disease    COPD (chronic obstructive pulmonary disease) (HCC)    Depression    DJD (degenerative joint disease) of knee    GERD (gastroesophageal reflux disease)    Hx of migraines    Hyperlipidemia    Hypertension    Incontinence    Neuropathy    Osteopenia    Panic attack    Shortness of breath  dyspnea    Wheezing     Past Surgical History:  Procedure Laterality Date   ABDOMINAL HYSTERECTOMY     BREAST ENHANCEMENT SURGERY     CARPOMETACARPAL (CMC) FUSION OF THUMB Left 05/21/2015   Procedure: CARPOMETACARPAL (CMC) FUSION OF THUMB;  Surgeon: Christena Flake, MD;  Location: [facility] ORS;  Service: Orthopedics;  Laterality: Left;   EYE SURGERY Bilateral 2013   GANGLION CYST EXCISION Left 05/21/2015   Procedure: REMOVAL GANGLION OF WRIST;  Surgeon: Christena Flake, MD;  Location: [facility] ORS;  Service: Orthopedics;  Laterality: Left;   INTRAOCULAR LENS INSERTION Bilateral    JOINT REPLACEMENT     bilateral knees   OOPHORECTOMY     with tumor removed before hysterectomy   OPEN REDUCTION INTERNAL FIXATION (ORIF) DISTAL RADIAL FRACTURE Right 03/12/2021   Procedure: OPEN REDUCTION INTERNAL FIXATION (ORIF) DISTAL RADIAL FRACTURE;  Surgeon: Kennedy Bucker, MD;  Location: [facility] ORS;  Service: Orthopedics;  Laterality: Right;   REPLACEMENT TOTAL KNEE BILATERAL Bilateral 2011   TUBAL LIGATION      Current Medications: Current Meds  Medication Sig   acetaminophen (TYLENOL) 500 MG tablet Take 500 mg by mouth in the morning, at noon, and at bedtime.   albuterol (PROVENTIL HFA;VENTOLIN HFA) 108 (90 Base) MCG/ACT inhaler Inhale 2 puffs into the lungs every 6 (six) hours as needed for wheezing or shortness  of breath.   albuterol (PROVENTIL) (2.5 MG/3ML) 0.083% nebulizer solution Take 2.5 mg by nebulization every 4 (four) hours as needed for wheezing or shortness of breath.   amLODipine (NORVASC) 5 MG tablet Take 1 tablet (5 mg total) by mouth daily.   aspirin EC 81 MG tablet Take 81 mg by mouth daily.   BREO ELLIPTA 200-25 MCG/ACT AEPB Inhale 1 puff into the lungs daily.   diphenhydrAMINE (BENADRYL) 25 mg capsule Take 25 mg by mouth in the morning and at bedtime.   EFFEXOR XR 75 MG 24 hr capsule Take 75 mg by mouth in the morning and at bedtime.   fexofenadine (ALLEGRA) 60 MG tablet Take 60 mg by mouth 2  (two) times daily.   ibuprofen (ADVIL) 200 MG tablet Take 200 mg by mouth 3 (three) times daily.   magnesium oxide (MAG-OX) 400 MG tablet Take 1 tablet by mouth daily.   metoprolol tartrate (LOPRESSOR) 100 MG tablet Take 1 tablet (100 mg total) by mouth once for 1 dose. TWO HOURS PRIOR TO CARDIAC CT   ondansetron (ZOFRAN-ODT) 4 MG disintegrating tablet Take 4 mg by mouth every 4 (four) hours as needed for nausea or vomiting.   oxybutynin (DITROPAN) 5 MG tablet Take 5 mg by mouth at bedtime.   traMADol (ULTRAM) 50 MG tablet Take 50 mg by mouth in the morning, at noon, in the evening, and at bedtime.   traZODone (DESYREL) 50 MG tablet Take 50 mg by mouth at bedtime.   triamcinolone cream (KENALOG) 0.1 % Apply 1 application topically 2 (two) times daily as needed (sores).   VITAMIN D PO Take by mouth.   [DISCONTINUED] chlorthalidone (HYGROTON) 25 MG tablet Take 12.5 mg by mouth every morning.     Allergies:   Penicillins, Flagyl [metronidazole], and Prednisone   Social History   Socioeconomic History   Marital status: Divorced    Spouse name: Not on file   Number of children: Not on file   Years of education: Not on file   Highest education level: Not on file  Occupational History   Not on file  Tobacco Use   Smoking status: Former    Current packs/day: 0.00    Types: Cigarettes    Quit date: 10/20/2008    Years since quitting: 14.4   Smokeless tobacco: Never  Vaping Use   Vaping status: Never Used  Substance and Sexual Activity   Alcohol use: No    Alcohol/week: 0.0 standard drinks of alcohol   Drug use: No   Sexual activity: Never  Other Topics Concern   Not on file  Social History Narrative   Not on file   Social Determinants of Health   Financial Resource Strain: Not on file  Food Insecurity: Not on file  Transportation Needs: Not on file  Physical Activity: Not on file  Stress: Not on file  Social Connections: Not on file     Family History: The patient's  family history includes Depression in her brother; Heart attack in her father; Heart disease in her mother and sister; Mental illness in her brother and mother; Muscular dystrophy in her sister.  ROS:   Please see the history of present illness.     All other systems reviewed and are negative.  EKGs/Labs/Other Studies Reviewed:    The following studies were reviewed today:  EKG Interpretation Date/Time:  Friday April 14 2023 13:58:26 EDT Ventricular Rate:  78 PR Interval:  180 QRS Duration:  90 QT Interval:  402  QTC Calculation: 458 R Axis:   -7  Text Interpretation: Normal sinus rhythm Normal ECG Confirmed by Debbe Odea ([id number]) on 04/14/2023 2:02:42 PM    Recent Labs: 09/12/2022: Platelets 314  Recent Lipid Panel No results found for: "CHOL", "TRIG", "HDL", "CHOLHDL", "VLDL", "LDLCALC", "LDLDIRECT"   Risk Assessment/Calculations:      Physical Exam:    VS:  BP (!) 152/82 (BP Location: Left Arm, Patient Position: Sitting, Cuff Size: Large)   Pulse 78   Ht 5\' 2"  (1.575 m)   Wt 178 lb 9.6 oz (81 kg)   SpO2 95%   BMI 32.67 kg/m     Wt Readings from Last 3 Encounters:  04/14/23 178 lb 9.6 oz (81 kg)  03/12/21 200 lb (90.7 kg)  03/09/21 200 lb (90.7 kg)     GEN:  Well nourished, well developed in no acute distress HEENT: Normal NECK: No JVD; No carotid bruits CARDIAC: RRR, no murmurs, rubs, gallops RESPIRATORY:  Clear to auscultation without rales, wheezing or rhonchi  ABDOMEN: Soft, non-tender, non-distended MUSCULOSKELETAL:  No edema; No deformity  SKIN: Warm and dry NEUROLOGIC:  Alert and oriented x 3 PSYCHIATRIC:  Normal affect   ASSESSMENT:    1. Precordial pain   2. Primary hypertension   3. Irregular heart beat    PLAN:    In order of problems listed above:  Chest pain, risk factors hypertension, former smoker.  Get echo, get coronary CTA. Hypertension, BP elevated.  History of hypokalemia, stop chlorthalidone.  Start Norvasc 5 mg daily.   Titrate Norvasc as needed for adequate BP control. Irregular heartbeat, place cardiac monitor to evaluate any significant arrhythmias.  Follow-up after cardiac testing.     Medication Adjustments/Labs and Tests Ordered: Current medicines are reviewed at length with the patient today.  Concerns regarding medicines are outlined above.  Orders Placed This Encounter  Procedures   CT CORONARY MORPH W/CTA COR W/SCORE W/CA W/CM &/OR WO/CM   Basic Metabolic Panel (BMET)   LONG TERM MONITOR (3-14 DAYS)   EKG 12-Lead   ECHOCARDIOGRAM COMPLETE   Meds ordered this encounter  Medications   amLODipine (NORVASC) 5 MG tablet    Sig: Take 1 tablet (5 mg total) by mouth daily.    Dispense:  180 tablet    Refill:  3   metoprolol tartrate (LOPRESSOR) 100 MG tablet    Sig: Take 1 tablet (100 mg total) by mouth once for 1 dose. TWO HOURS PRIOR TO CARDIAC CT    Dispense:  1 tablet    Refill:  0    Patient Instructions  Medication Instructions:   STOP chlorthalidone (HYGROTON) 25 MG tablet  START Amlodipine - Take one tablet ( 5mg ) by mouth daily.   *If you need a refill on your cardiac medications before your next appointment, please call your pharmacy*   Lab Work:  Your physician recommends you have labs today -- BMP  If you have labs (blood work) drawn today and your tests are completely normal, you will receive your results only by: MyChart Message (if you have MyChart) OR A paper copy in the mail If you have any lab test that is abnormal or we need to change your treatment, we will call you to review the results.   Testing/Procedures:  Your physician has requested that you have an echocardiogram. Echocardiography is a painless test that uses sound waves to create images of your heart. It provides your doctor with information about the size and shape of your heart  and how well your heart's chambers and valves are working. This procedure takes approximately one hour. There are no  restrictions for this procedure. Please do NOT wear cologne, perfume, aftershave, or lotions (deodorant is allowed). Please arrive 15 minutes prior to your appointment time.  Please note: We ask at that you not bring children with you during ultrasound (echo/ vascular) testing. Due to room size and safety concerns, children are not allowed in the ultrasound rooms during exams. Our front office staff cannot provide observation of children in our lobby area while testing is being conducted. An adult accompanying a patient to their appointment will only be allowed in the ultrasound room at the discretion of the ultrasound technician under special circumstances. We apologize for any inconvenience.  Your physician has recommended that you wear a Zio monitor.   This monitor is a medical device that records the heart's electrical activity. Doctors most often use these monitors to diagnose arrhythmias. Arrhythmias are problems with the speed or rhythm of the heartbeat. The monitor is a small device applied to your chest. You can wear one while you do your normal daily activities. While wearing this monitor if you have any symptoms to push the button and record what you felt. Once you have worn this monitor for the period of time provider prescribed (Usually 14 days), you will return the monitor device in the postage paid box. Once it is returned they will download the data collected and provide [place] with a report which the provider will then review and we will call you with those results. Important tips:  Avoid showering during the first 24 hours of wearing the monitor. Avoid excessive sweating to help maximize wear time. Do not submerge the device, no hot tubs, and no swimming pools. Keep any lotions or oils away from the patch. After 24 hours you may shower with the patch on. Take brief showers with your back facing the shower head.  Do not remove patch once it has been placed because that will interrupt data  and decrease adhesive wear time. Push the button when you have any symptoms and write down what you were feeling. Once you have completed wearing your monitor, remove and place into box which has postage paid and place in your outgoing mailbox.  If for some reason you have misplaced your box then call our office and we can provide another box and/or mail it off for you.   Your cardiac CT will be scheduled at one of the below locations:   [facility] [address] [place], [place] [address] [phone]  OR   [facility] Heart and Vascular entrance [address] [place], [place] [address] [phone]   If scheduled at [facility] or [facility], please arrive 15 mins early for check-in and test prep.  There is spacious parking and easy access to the radiology department from the [facility] entrance. Please enter here and check-in with the desk attendant.   Please follow these instructions carefully (unless otherwise directed):  An IV will be required for this test and Nitroglycerin will be given.  Hold all erectile dysfunction medications at least 3 days (72 hrs) prior to test. (Ie viagra, cialis, sildenafil, tadalafil, etc)   On the Night Before the Test: Be sure to Drink plenty of water. Do not consume any caffeinated/decaffeinated beverages or chocolate 12 hours prior to your test. Do not take any antihistamines 12 hours prior  to your test.  On the Day of the Test: Drink plenty of water until 1 hour prior to the test. Do not eat any food 1 hour prior to test. You may take your regular medications prior to the test.  Take metoprolol (Lopressor) two hours prior to test. If you take Furosemide/Hydrochlorothiazide/Spironolactone, please HOLD on the morning of the test. FEMALES- please wear underwire-free bra if available, avoid dresses & tight clothing  After the  Test: Drink plenty of water. After receiving IV contrast, you may experience a mild flushed feeling. This is normal. On occasion, you may experience a mild rash up to 24 hours after the test. This is not dangerous. If this occurs, you can take Benadryl 25 mg and increase your fluid intake. If you experience trouble breathing, this can be serious. If it is severe call 911 IMMEDIATELY. If it is mild, please call our office. If you take any of these medications: Glipizide/Metformin, Avandament, Glucavance, please do not take 48 hours after completing test unless otherwise instructed.  We will call to schedule your test 2-4 weeks out understanding that some insurance companies will need an authorization prior to the service being performed.   For more information and frequently asked questions, please visit our website : [email or web address]  For non-scheduling related questions, please contact the cardiac imaging nurse navigator should you have any questions/concerns: Cardiac Imaging Nurse Navigators Direct Office Dial: [phone]   For scheduling needs, including cancellations and rescheduling, please call [place], [phone].    Follow-Up: At [facility] you and your health needs are our priority.  As part of our continuing mission to provide you with exceptional heart care, we have created designated Provider Care Teams.  These Care Teams include your primary Cardiologist (physician) and Advanced Practice Providers (APPs -  Physician Assistants and Nurse Practitioners) who all work together to provide you with the care you need, when you need it.  We recommend signing up for the patient portal called "MyChart".  Sign up information is provided on this After Visit Summary.  MyChart is used to connect with patients for Virtual Visits (Telemedicine).  Patients are able to view lab/test results, encounter notes, upcoming appointments, etc.  Non-urgent messages can be  sent to your provider as well.   To learn more about what you can do with MyChart, go to [email or web address].    Your next appointment:    After Cardiac testing ( 6-8 weeks)  Provider:   You may see Debbe Odea, MD or one of the following Advanced Practice Providers on your designated Care Team:   Nicolasa Ducking, NP Eula Listen, PA-C Cadence Fransico Michael, PA-C Charlsie Quest, NP Carlos Levering, NP    Signed, Debbe Odea, MD  04/14/2023 2:54 PM    [facility]

## 2023-04-15 LAB — BASIC METABOLIC PANEL
BUN/Creatinine Ratio: 16 (ref 12–28)
BUN: 17 mg/dL (ref 8–27)
CO2: 28 mmol/L (ref 20–29)
Calcium: 9.1 mg/dL (ref 8.7–10.3)
Chloride: 94 mmol/L — ABNORMAL LOW (ref 96–106)
Creatinine, Ser: 1.05 mg/dL — ABNORMAL HIGH (ref 0.57–1.00)
Glucose: 91 mg/dL (ref 70–99)
Potassium: 3.1 mmol/L — ABNORMAL LOW (ref 3.5–5.2)
Sodium: 137 mmol/L (ref 134–144)
eGFR: 55 mL/min/{1.73_m2} — ABNORMAL LOW (ref >59)

## 2023-04-17 ENCOUNTER — Other Ambulatory Visit: Payer: Self-pay

## 2023-04-17 DIAGNOSIS — N189 Chronic kidney disease, unspecified: Secondary | ICD-10-CM

## 2023-04-17 DIAGNOSIS — I499 Cardiac arrhythmia, unspecified: Secondary | ICD-10-CM

## 2023-05-02 ENCOUNTER — Encounter (HOSPITAL_COMMUNITY): Payer: Self-pay

## 2023-05-03 ENCOUNTER — Other Ambulatory Visit: Payer: Medicare HMO

## 2023-05-03 ENCOUNTER — Telehealth (HOSPITAL_COMMUNITY): Payer: Self-pay | Admitting: *Deleted

## 2023-05-03 NOTE — Telephone Encounter (Signed)
Reaching out to patient to offer assistance regarding upcoming cardiac imaging study; pt verbalizes understanding of appt date/time, parking situation and where to check in, pre-test NPO status and medications ordered, and verified current allergies; name and call back number provided for further questions should they arise Hayley Sharpe RN Navigator Cardiac Imaging [facility] [phone] office [phone] cell  

## 2023-05-04 ENCOUNTER — Ambulatory Visit
Admission: RE | Admit: 2023-05-04 | Discharge: 2023-05-04 | Disposition: A | Discharge status: Home / Self Care | Payer: Medicare HMO | Source: Ambulatory Visit | Attending: Cardiology | Admitting: Cardiology

## 2023-05-04 DIAGNOSIS — R072 Precordial pain: Secondary | ICD-10-CM | POA: Insufficient documentation

## 2023-05-04 MED ORDER — NITROGLYCERIN 0.4 MG SL SUBL
0.8000 mg | SUBLINGUAL_TABLET | Freq: Once | SUBLINGUAL | Status: AC
  Administered 2023-05-04: 0.8 mg via SUBLINGUAL

## 2023-05-04 MED ORDER — SODIUM CHLORIDE 0.9 % IV BOLUS
125.0000 mL | Freq: Once | INTRAVENOUS | Status: AC
  Administered 2023-05-04: 125 mL via INTRAVENOUS

## 2023-05-04 MED ORDER — IOHEXOL 350 MG/ML SOLN
100.0000 mL | Freq: Once | INTRAVENOUS | Status: AC | PRN
  Administered 2023-05-04: 100 mL via INTRAVENOUS

## 2023-05-04 NOTE — Progress Notes (Signed)
Patient tolerated procedure well. Ambulate w/o difficulty. Denies light headedness or being dizzy. Sitting up drinking water provided. Encouraged to drink extra water today and reasoning explained. Verbalized understanding. All questions answered. ABC intact. No further needs. Discharge from procedure area w/o issues.

## 2023-05-08 ENCOUNTER — Other Ambulatory Visit: Payer: Self-pay

## 2023-05-08 DIAGNOSIS — I2510 Atherosclerotic heart disease of native coronary artery without angina pectoris: Secondary | ICD-10-CM

## 2023-05-10 ENCOUNTER — Ambulatory Visit: Payer: Medicare HMO | Attending: Cardiology

## 2023-05-10 DIAGNOSIS — R072 Precordial pain: Secondary | ICD-10-CM | POA: Diagnosis not present

## 2023-05-10 LAB — ECHOCARDIOGRAM COMPLETE
AR max vel: 3.11 cm2
AV Area VTI: 3.11 cm2
AV Area mean vel: 3.13 cm2
AV Mean grad: 3 mm[Hg]
AV Peak grad: 5.4 mm[Hg]
Ao pk vel: 1.16 m/s
Area-P 1/2: 4.39 cm2
Calc EF: 60.6 %
P 1/2 time: 356 ms
S' Lateral: 3.3 cm
Single Plane A2C EF: 62.8 %
Single Plane A4C EF: 55 %

## 2023-05-19 ENCOUNTER — Other Ambulatory Visit: Payer: Self-pay

## 2023-05-19 MED ORDER — METOPROLOL SUCCINATE ER 25 MG PO TB24: 25.0000 mg | ORAL_TABLET | Freq: Every day | ORAL | 3 refills | Status: DC

## 2023-06-02 ENCOUNTER — Ambulatory Visit: Payer: Medicare HMO | Admitting: Nurse Practitioner

## 2023-07-05 ENCOUNTER — Encounter: Payer: Self-pay | Admitting: Nurse Practitioner

## 2023-07-05 ENCOUNTER — Ambulatory Visit: Payer: 59 | Attending: Nurse Practitioner | Admitting: Nurse Practitioner

## 2023-07-05 VITALS — BP 168/60 | HR 58 | Ht 63.0 in | Wt 180.2 lb

## 2023-07-05 DIAGNOSIS — N182 Chronic kidney disease, stage 2 (mild): Secondary | ICD-10-CM

## 2023-07-05 DIAGNOSIS — E876 Hypokalemia: Secondary | ICD-10-CM

## 2023-07-05 DIAGNOSIS — R002 Palpitations: Secondary | ICD-10-CM

## 2023-07-05 DIAGNOSIS — I10 Essential (primary) hypertension: Secondary | ICD-10-CM

## 2023-07-05 DIAGNOSIS — R072 Precordial pain: Secondary | ICD-10-CM

## 2023-07-05 DIAGNOSIS — R918 Other nonspecific abnormal finding of lung field: Secondary | ICD-10-CM

## 2023-07-05 DIAGNOSIS — I493 Ventricular premature depolarization: Secondary | ICD-10-CM

## 2023-07-05 DIAGNOSIS — I340 Nonrheumatic mitral (valve) insufficiency: Secondary | ICD-10-CM

## 2023-07-05 DIAGNOSIS — I471 Supraventricular tachycardia, unspecified: Secondary | ICD-10-CM

## 2023-07-05 DIAGNOSIS — I2510 Atherosclerotic heart disease of native coronary artery without angina pectoris: Secondary | ICD-10-CM | POA: Diagnosis not present

## 2023-07-05 MED ORDER — AMLODIPINE BESYLATE 10 MG PO TABS: 10.0000 mg | ORAL_TABLET | Freq: Every day | ORAL | 1 refills | Status: DC

## 2023-07-05 MED ORDER — ATORVASTATIN CALCIUM 10 MG PO TABS: 10.0000 mg | ORAL_TABLET | Freq: Every day | ORAL | 3 refills | Status: DC

## 2023-07-05 NOTE — Progress Notes (Signed)
Office Visit    Patient Name: Patricia Gomez Date of Encounter: 07/05/2023  Primary Care Provider:  [facility] Primary Cardiologist:  Debbe Odea, MD  Chief Complaint    78 y.o. female with a history of chest pain, nonobstructive CAD, hypertension, hyperlipidemia, PSVT, moderate mitral regurgitation, diastolic dysfunction, CKD 2, and remote tobacco abuse, who presents for f/u after recent event monitoring and coronary CTA.  Past Medical History  Subjective   Past Medical History:  Diagnosis Date   Anemia    Anxiety    Asthma    CAD (nonobstructive)    a. 04/2023 Cor CTA: Ca2+ = 153 (63rd%'ile). LAD 25, otw nl cors.   CKD (chronic kidney disease), stage II    COPD (chronic obstructive pulmonary disease) (HCC)    Depression    Diastolic dysfunction    a. 01/2016 Echo: EF 55-60%; b. 04/2023 Echo: EF 60-65%, no rwma, GrI DD, nl RV fxn, RVSP 40.22mmHg, mildly dil LA, mod MR, mild AI.   DJD (degenerative joint disease) of knee    GERD (gastroesophageal reflux disease)    Hx of migraines    Hyperlipidemia    Hypertension    Incontinence    Moderate mitral regurgitation    a. 04/2023 Echo: Mod MR.   Neuropathy    Osteopenia    Panic attack    PSVT (paroxysmal supraventricular tachycardia) (HCC)    a. 04/2023 Zio: Predominantly sinus rhythm (49-207, average 72).  10 SVT runs (longest/fastest 15 beats at 182 with max rate of 207).  4.4% PVC burden.   Pulmonary nodules    a. 04/2023 CTA: small pulm nodules, largest 5mm.  F/u non-contrast chest CT in 1 yr (long smoking hx).   PVC's (premature ventricular contractions)    a. 04/2023 Zio: 4.4% PVC burden.   Past Surgical History:  Procedure Laterality Date   ABDOMINAL HYSTERECTOMY     BREAST ENHANCEMENT SURGERY     CARPOMETACARPAL (CMC) FUSION OF THUMB Left 05/21/2015   Procedure: CARPOMETACARPAL (CMC) FUSION OF THUMB;  Surgeon: Christena Flake, MD;  Location: [facility];  Service: Orthopedics;   Laterality: Left;   EYE SURGERY Bilateral 2013   GANGLION CYST EXCISION Left 05/21/2015   Procedure: REMOVAL GANGLION OF WRIST;  Surgeon: Christena Flake, MD;  Location: [facility];  Service: Orthopedics;  Laterality: Left;   INTRAOCULAR LENS INSERTION Bilateral    JOINT REPLACEMENT     bilateral knees   OOPHORECTOMY     with tumor removed before hysterectomy   OPEN REDUCTION INTERNAL FIXATION (ORIF) DISTAL RADIAL FRACTURE Right 03/12/2021   Procedure: OPEN REDUCTION INTERNAL FIXATION (ORIF) DISTAL RADIAL FRACTURE;  Surgeon: Kennedy Bucker, MD;  Location: [facility] OR[facility];  Service: Orthopedics;  Laterality: Right;   REPLACEMENT TOTAL KNEE BILATERAL Bilateral 2011   TUBAL LIGATION      Allergies  Allergies  Allergen Reactions   Penicillins Anaphylaxis, Rash and Other (See Comments)    Has patient had a PCN reaction causing immediate rash, facial/tongue/throat swelling, SOB or lightheadedness with hypotension: Yes Has patient had a PCN reaction causing severe rash involving mucus membranes or skin necrosis: No Has patient had a PCN reaction that required hospitalization No Has patient had a PCN reaction occurring within the last 10 years: No If all of the above answers are "NO", then may proceed with Cephalosporin use.   Flagyl [Metronidazole] Nausea And Vomiting   Losartan     GI upset   Prednisone Diarrhea and Nausea And Vomiting  History of Present Illness      78 y.o. y/o female with a history of chest pain, nonobstructive CAD, hypertension, hyperlipidemia, PSVT, moderate mitral regurgitation, diastolic dysfunction, CKD 2, and remote tobacco abuse.  She established care with Dr. Azucena Cecil in November 2024 due to a several month history of intermittent chest pain, labile hypertension, orthostatic lightheadedness, and a 5-year history of palpitations and irregular heartbeat.  She was hypertensive and placed on amlodipine 5 mg daily.  She subsequently underwent echocardiogram which showed an  EF of 60 to 65% with an RVSP of 40.8 mmHg, mildly dilated LA, moderate MR, and mild AI.  Coronary CT angiogram showed a calcium score of 153 (63rd percentile), with mild proximal LAD stenosis of 25%, and otherwise normal coronary arteries.  She was incidentally noted to have small pulmonary nodules measuring up to 5 mm on CT with recommendation for follow-up noncontrast CT in 12 months.  Event monitoring showed predominantly sinus rhythm with 10 runs of SVT up to 15 beats at a maximum rate of 207, and a 4.4% PVC burden.  She was placed on metoprolol therapy.    Since her last visit, she has done well.  She notes that her palpitations have more or less subsided and she has not had any since Christmas.  She denies chest pain, dyspnea, PND, orthopnea, dizziness, syncope, edema, or early satiety.  She does check her blood pressure at home and is typically in the 160 range.  She is 140/60 today.  Objective  Home Medications    Current Outpatient Medications  Medication Sig Dispense Refill   acetaminophen (TYLENOL) 500 MG tablet Take 500 mg by mouth in the morning, at noon, and at bedtime.     albuterol (PROVENTIL HFA;VENTOLIN HFA) 108 (90 Base) MCG/ACT inhaler Inhale 2 puffs into the lungs every 6 (six) hours as needed for wheezing or shortness of breath. 1 Inhaler 0   albuterol (PROVENTIL) (2.5 MG/3ML) 0.083% nebulizer solution Take 2.5 mg by nebulization every 4 (four) hours as needed for wheezing or shortness of breath.     aspirin EC 81 MG tablet Take 81 mg by mouth daily.     atorvastatin (LIPITOR) 10 MG tablet Take 1 tablet (10 mg total) by mouth daily. 90 tablet 3   BREO ELLIPTA 200-25 MCG/ACT AEPB Inhale 1 puff into the lungs daily.     buPROPion (WELLBUTRIN SR) 150 MG 12 hr tablet Take 150 mg by mouth daily.     diphenhydrAMINE (BENADRYL) 25 mg capsule Take 25 mg by mouth in the morning and at bedtime.     EFFEXOR XR 75 MG 24 hr capsule Take 75 mg by mouth in the morning and at bedtime.      fexofenadine (ALLEGRA) 60 MG tablet Take 60 mg by mouth 2 (two) times daily.     ibuprofen (ADVIL) 200 MG tablet Take 200 mg by mouth 3 (three) times daily.     magnesium oxide (MAG-OX) 400 MG tablet Take 1 tablet by mouth daily.     metoprolol succinate (TOPROL XL) 25 MG 24 hr tablet Take 1 tablet (25 mg total) by mouth daily. 90 tablet 3   ondansetron (ZOFRAN-ODT) 4 MG disintegrating tablet Take 4 mg by mouth every 4 (four) hours as needed for nausea or vomiting.     oxybutynin (DITROPAN) 5 MG tablet Take 5 mg by mouth at bedtime.     traMADol (ULTRAM) 50 MG tablet Take 50 mg by mouth in the morning, at noon, in the  evening, and at bedtime.     traZODone (DESYREL) 50 MG tablet Take 50 mg by mouth at bedtime.     triamcinolone cream (KENALOG) 0.1 % Apply 1 application topically 2 (two) times daily as needed (sores).     amitriptyline (ELAVIL) 50 MG tablet Take 50 mg by mouth in the morning, at noon, and at bedtime. (Patient not taking: Reported on 04/14/2023)     amLODipine (NORVASC) 10 MG tablet Take 1 tablet (10 mg total) by mouth daily. 90 tablet 1   buPROPion ER (WELLBUTRIN SR) 100 MG 12 hr tablet Take 100 mg by mouth at bedtime. (Patient not taking: Reported on 04/14/2023)     ferrous sulfate 325 (65 FE) MG tablet Take 325 mg by mouth daily with breakfast. (Patient not taking: Reported on 04/14/2023)     HYDROcodone-acetaminophen (NORCO/VICODIN) 5-325 MG tablet Take 1 tablet by mouth every 4 (four) hours as needed for moderate pain (pain score 4-6). (Patient not taking: Reported on 07/05/2023) 30 tablet 0   omeprazole (PRILOSEC) 20 MG capsule Take 20 mg by mouth daily. (Patient not taking: Reported on 04/14/2023)     VITAMIN D PO Take by mouth. (Patient not taking: Reported on 07/05/2023)     Vitamin D, Ergocalciferol, (DRISDOL) 1.25 MG (50000 UNIT) CAPS capsule Take 50,000 Units by mouth every Sunday. (Patient not taking: Reported on 04/14/2023)     No current facility-administered medications for  this visit.     Physical Exam    VS:  BP (!) 168/60   Pulse (!) 58   Ht 5\' 3"  (1.6 m)   Wt 180 lb 4 oz (81.8 kg)   SpO2 95%   BMI 31.93 kg/m  , BMI Body mass index is 31.93 kg/m.       GEN: Well nourished, well developed, in no acute distress. HEENT: normal. Neck: Supple, no JVD, carotid bruits, or masses. Cardiac: RRR, no murmurs, rubs, or gallops. No clubbing, cyanosis, edema.  Radials 2+/PT 2+ and equal bilaterally.  Respiratory:  Respirations regular and unlabored, clear to auscultation bilaterally. GI: Soft, nontender, nondistended, BS + x 4. MS: no deformity or atrophy. Skin: warm and dry, no rash. Neuro:  Strength and sensation are intact. Psych: Normal affect.  Accessory Clinical Findings    ECG personally reviewed by me today - EKG Interpretation Date/Time:  Wednesday July 05 2023 15:30:55 EST Ventricular Rate:  58 PR Interval:  178 QRS Duration:  86 QT Interval:  386 QTC Calculation: 378 R Axis:   -3  Text Interpretation: Sinus bradycardia Confirmed by Nicolasa Ducking [phone]) on 07/05/2023 3:36:51 PM  - no acute changes.  Lab Results  Component Value Date   WBC 8.5 03/09/2021   HGB 14.3 03/12/2021   HCT 42.0 03/12/2021   MCV 87.1 03/09/2021   PLT 314 09/12/2022   Lab Results  Component Value Date   CREATININE 1.05 (H) 04/14/2023   BUN 17 04/14/2023   NA 137 04/14/2023   K 3.1 (L) 04/14/2023   CL 94 (L) 04/14/2023   CO2 28 04/14/2023   Lab Results  Component Value Date   ALT 13 (L) 10/29/2015   AST 18 10/29/2015   ALKPHOS 160 (H) 10/29/2015   BILITOT 0.5 10/29/2015    Lab Results  Component Value Date   TSH 2.905 10/13/2015    Labs from [facility] dated June 2024: Total cholesterol 150, triglycerides 120, HDL 51, LDL 78    Assessment & Plan    1.  Precordial chest pain/nonobstructive CAD:  Patient previously underwent coronary CT angiogram in November 2024, in the setting of chest pain, which showed mild, nonobstructive LAD disease  and otherwise normal coronary arteries.  She has not been having any chest pain and remains on beta-blocker and calcium channel blocker therapy.  In light of mild LAD disease with an LDL of 70 in June 2024, we agreed to start atorvastatin 10 mg daily.  Plan for follow-up lipids and LFTs at next office visit.  2.  Palpitations/PSVT/PVCs: Recent event monitoring notable for 4.4% PVC burden and 10 brief runs of SVT.  She has since been on metoprolol therapy and has noted complete resolution of palpitations dating back to Christmas.  Sinus bradycardia on ECG today.  Continue current dose of beta-blocker.  3.  Primary hypertension: Placed on amlodipine therapy at her last visit.  Since then, she has been taking 5 mg daily and also metoprolol XL 25 mg daily.  Despite this, pressures elevated in the 140-160 range today and also at home.  Increase amlodipine to 10 mg daily.  4.  Moderate MR: Noted on echo.  I do not appreciate a significant murmur.  Plan to follow-up echo in 2 years or sooner if symptoms require.  5.  Pulmonary nodules: Incidentally noted on coronary CT angiogram, measuring up to 5 mm.  Long history of tobacco abuse-from age 44 until her late 8s.  Will need follow-up noncontrast CT in 1 year.  Discussed with her today.  6.  Hypokalemia: Potassium was 3.1 in November.  Follow-up today.  7.  Stage II chronic kidney disease: Creatinine 1.05 in November.  Labs today in the setting of hypokalemia.  8.  Disposition: Follow-up basic metabolic panel today.  Follow-up in clinic in 6 to 8 weeks with plan for lipids and LFTs at that time.  Nicolasa Ducking, NP 07/05/2023, 4:11 PM

## 2023-07-05 NOTE — Patient Instructions (Addendum)
Medication Instructions:  Your physician recommends the following medication changes.  Start taking Atorvastatin 10 mg once daily  Increase Amlodipine to 10 mg once daily  *If you need a refill on your cardiac medications before your next appointment, please call your pharmacy*   Lab Work: Your provider would like for you to have the following labs today: BMET  If you have labs (blood work) drawn today and your tests are completely normal, you will receive your results only by: MyChart Message (if you have MyChart) OR A paper copy in the mail If you have any lab test that is abnormal or we need to change your treatment, we will call you to review the results.   Testing/Procedures: None ordered   Follow-Up: At [facility], you and your health needs are our priority.  As part of our continuing mission to provide you with exceptional heart care, we have created designated Provider Care Teams.  These Care Teams include your primary Cardiologist (physician) and Advanced Practice Providers (APPs -  Physician Assistants and Nurse Practitioners) who all work together to provide you with the care you need, when you need it.  We recommend signing up for the patient portal called "MyChart".  Sign up information is provided on this After Visit Summary.  MyChart is used to connect with patients for Virtual Visits (Telemedicine).  Patients are able to view lab/test results, encounter notes, upcoming appointments, etc.  Non-urgent messages can be sent to your provider as well.   To learn more about what you can do with MyChart, go to [email or web address].    Your next appointment:   6 week(s)  Provider:   You may see Debbe Odea, MD or one of the following Advanced Practice Providers on your designated Care Team:   Nicolasa Ducking, NP

## 2023-07-06 LAB — BASIC METABOLIC PANEL
BUN/Creatinine Ratio: 17 (ref 12–28)
BUN: 16 mg/dL (ref 8–27)
CO2: 24 mmol/L (ref 20–29)
Calcium: 8.9 mg/dL (ref 8.7–10.3)
Chloride: 101 mmol/L (ref 96–106)
Creatinine, Ser: 0.94 mg/dL (ref 0.57–1.00)
Glucose: 95 mg/dL (ref 70–99)
Potassium: 3.5 mmol/L (ref 3.5–5.2)
Sodium: 141 mmol/L (ref 134–144)
eGFR: 62 mL/min/{1.73_m2} (ref >59)

## 2023-07-07 ENCOUNTER — Encounter: Payer: Self-pay | Admitting: *Deleted

## 2023-09-05 ENCOUNTER — Encounter: Payer: Self-pay | Admitting: Nurse Practitioner

## 2023-09-05 ENCOUNTER — Ambulatory Visit: Payer: 59 | Attending: Nurse Practitioner | Admitting: Nurse Practitioner

## 2023-09-05 VITALS — BP 142/78 | HR 75 | Ht 63.0 in | Wt 178.0 lb

## 2023-09-05 DIAGNOSIS — I2510 Atherosclerotic heart disease of native coronary artery without angina pectoris: Secondary | ICD-10-CM | POA: Diagnosis not present

## 2023-09-05 DIAGNOSIS — I340 Nonrheumatic mitral (valve) insufficiency: Secondary | ICD-10-CM

## 2023-09-05 DIAGNOSIS — R002 Palpitations: Secondary | ICD-10-CM | POA: Diagnosis not present

## 2023-09-05 DIAGNOSIS — N182 Chronic kidney disease, stage 2 (mild): Secondary | ICD-10-CM

## 2023-09-05 DIAGNOSIS — I471 Supraventricular tachycardia, unspecified: Secondary | ICD-10-CM

## 2023-09-05 DIAGNOSIS — R918 Other nonspecific abnormal finding of lung field: Secondary | ICD-10-CM

## 2023-09-05 DIAGNOSIS — I493 Ventricular premature depolarization: Secondary | ICD-10-CM

## 2023-09-05 DIAGNOSIS — I10 Essential (primary) hypertension: Secondary | ICD-10-CM

## 2023-09-05 MED ORDER — METOPROLOL SUCCINATE ER 25 MG PO TB24: 25.0000 mg | ORAL_TABLET | Freq: Two times a day (BID) | ORAL | 3 refills | Status: DC

## 2023-09-05 NOTE — Patient Instructions (Signed)
 Medication Instructions:  Increase Metoprolol XL to 25 mg twice daily   *If you need a refill on your cardiac medications before your next appointment, please call your pharmacy*   Lab Work: Your provider would like for you to return to have the following labs drawn: LIPID/LFT.   Please go to [facility] [address] ([facility] [address], [place] [address] You do not need an appointment.  They are open from 8 am- 4:30 pm.  Lunch from 1:00 pm- 2:00 pm You DO need to be fasting.   You may also go to one of the following [facility]:  [address] [place], [place] [address] Phone: [phone] Lab hours: Mon-Fri 8 am- 5 pm    Lunch 12 pm- 1 pm  [address] [place],  [place]  [address]  [place] Phone: [phone] Lab hours: 7 am- 4 pm Lunch 12 pm-1 pm   [address] [place],  [place]  [address]  [place] Phone: [phone] Lab hours: Mon-Fri 8 am- 5 pm    Lunch 12 pm- 1 pm  If you have labs (blood work) drawn today and your tests are completely normal, you will receive your results only by: MyChart Message (if you have MyChart) OR A paper copy in the mail If you have any lab test that is abnormal or we need to change your treatment, we will call you to review the results.   Follow-Up: At [facility], you and your health needs are our priority.  As part of our continuing mission to provide you with exceptional heart care, we have created designated Provider Care Teams.  These Care Teams include your primary Cardiologist (physician) and Advanced Practice Providers (APPs -  Physician Assistants and Nurse Practitioners) who all work together to provide you with the care you need, when you need it.  We recommend signing up for the patient portal called "MyChart".  Sign up information is provided on this After Visit Summary.  MyChart is used to connect with patients for Virtual Visits (Telemedicine).  Patients are able to view lab/test results, encounter  notes, upcoming appointments, etc.  Non-urgent messages can be sent to your provider as well.   To learn more about what you can do with MyChart, go to [email or web address].    Your next appointment:   3 month(s)  Provider:   You may see Debbe Odea, MD or one of the following Advanced Practice Providers on your designated Care Team:   Nicolasa Ducking, NP Eula Listen, PA-C Cadence Fransico Michael, PA-C Charlsie Quest, NP Carlos Levering, NP

## 2023-09-05 NOTE — Progress Notes (Signed)
 Office Visit    Patient Name: Patricia Gomez Date of Encounter: 09/05/2023  Primary Care Provider:  Louis Matte, MD Primary Cardiologist:  Patricia Odea, MD  Chief Complaint    78 y.o. female with a history of chest pain, nonobstructive CAD, hypertension, hyperlipidemia, PSVT, moderate mitral regurgitation, diastolic dysfunction, CKD 2, and remote tobacco abuse, who presents for f/u related to HTN and palpitations.  Past Medical History  Subjective   Past Medical History:  Diagnosis Date   Anemia    Anxiety    Asthma    CAD (nonobstructive)    a. 04/2023 Cor CTA: Ca2+ = 153 (63rd%'ile). LAD 25, otw nl cors.   CKD (chronic kidney disease), stage II    COPD (chronic obstructive pulmonary disease) (HCC)    Depression    Diastolic dysfunction    a. 01/2016 Echo: EF 55-60%; b. 04/2023 Echo: EF 60-65%, no rwma, GrI DD, nl RV fxn, RVSP 40.77mmHg, mildly dil LA, mod MR, mild AI.   DJD (degenerative joint disease) of knee    GERD (gastroesophageal reflux disease)    Hx of migraines    Hyperlipidemia    Hypertension    Incontinence    Moderate mitral regurgitation    a. 04/2023 Echo: Mod MR.   Neuropathy    Osteopenia    Panic attack    PSVT (paroxysmal supraventricular tachycardia) (HCC)    a. 04/2023 Zio: Predominantly sinus rhythm (49-207, average 72).  10 SVT runs (longest/fastest 15 beats at 182 with max rate of 207).  4.4% PVC burden.   Pulmonary nodules    a. 04/2023 CTA: small pulm nodules, largest 5mm.  F/u non-contrast chest CT in 1 yr (long smoking hx).   PVC's (premature ventricular contractions)    a. 04/2023 Zio: 4.4% PVC burden.   Past Surgical History:  Procedure Laterality Date   ABDOMINAL HYSTERECTOMY     BREAST ENHANCEMENT SURGERY     CARPOMETACARPAL (CMC) FUSION OF THUMB Left 05/21/2015   Procedure: CARPOMETACARPAL (CMC) FUSION OF THUMB;  Surgeon: Patricia Flake, MD;  Location: [facility];  Service: Orthopedics;  Laterality: Left;   EYE  SURGERY Bilateral 2013   GANGLION CYST EXCISION Left 05/21/2015   Procedure: REMOVAL GANGLION OF WRIST;  Surgeon: Patricia Flake, MD;  Location: [facility];  Service: Orthopedics;  Laterality: Left;   INTRAOCULAR LENS INSERTION Bilateral    JOINT REPLACEMENT     bilateral knees   OOPHORECTOMY     with tumor removed before hysterectomy   OPEN REDUCTION INTERNAL FIXATION (ORIF) DISTAL RADIAL FRACTURE Right 03/12/2021   Procedure: OPEN REDUCTION INTERNAL FIXATION (ORIF) DISTAL RADIAL FRACTURE;  Surgeon: Patricia Bucker, MD;  Location: [facility];  Service: Orthopedics;  Laterality: Right;   REPLACEMENT TOTAL KNEE BILATERAL Bilateral 2011   TUBAL LIGATION      Allergies  Allergies  Allergen Reactions   Penicillins Anaphylaxis, Rash and Other (See Comments)    Has patient had a PCN reaction causing immediate rash, facial/tongue/throat swelling, SOB or lightheadedness with hypotension: Yes Has patient had a PCN reaction causing severe rash involving mucus membranes or skin necrosis: No Has patient had a PCN reaction that required hospitalization No Has patient had a PCN reaction occurring within the last 10 years: No If all of the above answers are "NO", then may proceed with Cephalosporin use.   Flagyl [Metronidazole] Nausea And Vomiting   Losartan     GI upset   Prednisone Diarrhea and Nausea And Vomiting  History of Present Illness      78 y.o. y/o female with a history of chest pain, nonobstructive CAD, hypertension, hyperlipidemia, PSVT, moderate mitral regurgitation, diastolic dysfunction, CKD 2, and remote tobacco abuse.  She established care with Dr. Azucena Gomez in November 2024 due to a several month history of intermittent chest pain, labile hypertension, orthostatic lightheadedness, and a 5-year history of palpitations and irregular heartbeat.  She was hypertensive and placed on amlodipine 5 mg daily.  She subsequently underwent echocardiogram which showed an EF of 60 to 65% with an  RVSP of 40.8 mmHg, mildly dilated LA, moderate MR, and mild AI.  Coronary CT angiogram showed a calcium score of 153 (63rd percentile), with mild proximal LAD stenosis of 25%, and otherwise normal coronary arteries.  She was incidentally noted to have small pulmonary nodules measuring up to 5 mm on CT with recommendation for follow-up noncontrast CT in 12 months.  Event monitoring showed predominantly sinus rhythm with 10 runs of SVT up to 15 beats at a maximum rate of 207, and a 4.4% PVC burden.  She was placed on metoprolol therapy.     Patricia Gomez was last seen in cardiology clinic in January 2025, at which time she felt well, with more or less complete resolution of palpitations, though she was hypertensive prompting me to increase her amlodipine to 10 mg daily.  Since her last visit, she has been having more frequent elevations of heart rates in the morning, with rates trending sometimes in the 90s to low 100s associated with palpitations.  This goes on for about 2 hours after waking and then resolves after taking her morning dose of metoprolol.  She has not been having palpitations throughout the day.  Blood pressures at home continue to trend in the 140 range, similar to what we see today at 142/68.  She denies chest pain, dyspnea, PND, orthopnea, dizziness, syncope, edema, or early satiety. Objective  Home Medications    Current Outpatient Medications  Medication Sig Dispense Refill   acetaminophen (TYLENOL) 500 MG tablet Take 500 mg by mouth in the morning, at noon, and at bedtime.     albuterol (PROVENTIL HFA;VENTOLIN HFA) 108 (90 Base) MCG/ACT inhaler Inhale 2 puffs into the lungs every 6 (six) hours as needed for wheezing or shortness of breath. 1 Inhaler 0   albuterol (PROVENTIL) (2.5 MG/3ML) 0.083% nebulizer solution Take 2.5 mg by nebulization every 4 (four) hours as needed for wheezing or shortness of breath.     amLODipine (NORVASC) 10 MG tablet Take 1 tablet (10 mg total) by mouth daily.  90 tablet 1   aspirin EC 81 MG tablet Take 81 mg by mouth daily.     atorvastatin (LIPITOR) 10 MG tablet Take 1 tablet (10 mg total) by mouth daily. 90 tablet 3   Azelastine HCl 137 MCG/SPRAY SOLN Place 1 spray into both nostrils daily.     BREO ELLIPTA 200-25 MCG/ACT AEPB Inhale 1 puff into the lungs daily.     EFFEXOR XR 75 MG 24 hr capsule Take 75 mg by mouth in the morning and at bedtime.     fexofenadine (ALLEGRA) 60 MG tablet Take 60 mg by mouth 2 (two) times daily.     ibuprofen (ADVIL) 200 MG tablet Take 200 mg by mouth 3 (three) times daily.     lidocaine (LIDODERM) 5 % Place 2 patches onto the skin daily.     omeprazole (PRILOSEC) 20 MG capsule Take 20 mg by mouth daily.  oxybutynin (DITROPAN) 5 MG tablet Take 5 mg by mouth at bedtime.     traMADol (ULTRAM) 50 MG tablet Take 50 mg by mouth in the morning, at noon, in the evening, and at bedtime.     traZODone (DESYREL) 50 MG tablet Take 50 mg by mouth at bedtime.     triamcinolone cream (KENALOG) 0.1 % Apply 1 application topically 2 (two) times daily as needed (sores).     metoprolol succinate (TOPROL XL) 25 MG 24 hr tablet Take 1 tablet (25 mg total) by mouth 2 (two) times daily. 90 tablet 3   ondansetron (ZOFRAN) 4 MG tablet Take 4 mg by mouth every 8 (eight) hours as needed for vomiting or nausea.     No current facility-administered medications for this visit.     Physical Exam    VS:  BP (!) 142/78   Pulse 75   Ht 5\' 3"  (1.6 m)   Wt 178 lb (80.7 kg)   SpO2 92%   BMI 31.53 kg/m  , BMI Body mass index is 31.53 kg/m.       GEN: Well nourished, well developed, in no acute distress. HEENT: normal. Neck: Supple, no JVD, carotid bruits, or masses. Cardiac: RRR, no murmurs, rubs, or gallops. No clubbing, cyanosis, edema.  Radials 2+/PT 2+ and equal bilaterally.  Respiratory:  Respirations regular and unlabored, clear to auscultation bilaterally. GI: Soft, nontender, nondistended, BS + x 4. MS: no deformity or  atrophy. Skin: warm and dry, no rash. Neuro:  Strength and sensation are intact. Psych: Normal affect.  Accessory Clinical Findings    Lab Results  Component Value Date   WBC 8.5 03/09/2021   HGB 14.3 03/12/2021   HCT 42.0 03/12/2021   MCV 87.1 03/09/2021   PLT 314 09/12/2022   Lab Results  Component Value Date   CREATININE 0.94 07/05/2023   BUN 16 07/05/2023   NA 141 07/05/2023   K 3.5 07/05/2023   CL 101 07/05/2023   CO2 24 07/05/2023   Lab Results  Component Value Date   ALT 13 (L) 10/29/2015   AST 18 10/29/2015   ALKPHOS 160 (H) 10/29/2015   BILITOT 0.5 10/29/2015   Lab Results  Component Value Date   TSH 2.905 10/13/2015       Assessment & Plan    1.  Palpitations/PSVT/PVCs: Event monitoring in November 2024 showed a 4.4% PVC burden and 10 brief runs of SVT.  Though she believes she has had complete resolution of PVCs based on her symptoms, she has noted mild elevations in heart rate in the mornings associated with palpitations.  This usually last about 2 hours and resolves after taking her morning dose of metoprolol.  No symptoms throughout the day.  I am increasing her metoprolol XL to 25 mg twice daily.  2.  Precordial chest pain/nonobstructive CAD: Previously underwent coronary CT angiogram in November 2024 in the setting of chest pain, which showed mild, nonobstructive LAD disease and otherwise normal coronary arteries.  She has not been having any chest pain or dyspnea.  She remains on beta-blocker, aspirin, statin, and calcium channel blocker therapy.  3.  Primary hypertension: Blood pressure elevated at 142/68 today, which is similar to the readings that she obtains at home.  Increasing Toprol-XL to 25 mg twice daily.  Continue amlodipine.  4.  Moderate mitral regurgitation: Noted on echocardiogram last year.  I do not appreciate a significant murmur.  Plan follow-up echo in 2 years or sooner if symptoms require.  5.  Pulmonary nodules: Incidentally noted  on coronary CT angiogram measuring up to 5 mm.  Long history of tobacco abuse from age 51 until her late 12s.  She will need a noncontrast CT in November 2025 to assess stability.  6.  Hypokalemia: Potassium was 3.1 in November 2024 and improved to 3.5 in January 2025.  7.  Stage II chronic kidney disease: Creatinine stable at 0.94 in January 2025.  8.  Hyperlipidemia: On statin therapy in the setting of coronary artery disease noted on coronary CT angiogram.  She is due for lipids and LFTs however, she is not fasting.  We have placed orders for a fasting profile and she will come back to have this performed at her convenience.  9.  Disposition: Follow-up lipids and LFTs.  [facility] first to follow-up in 3 months and will notify us if follow-up is required sooner.  Nicolasa Ducking, NP 09/05/2023, 5:44 PM

## 2023-12-06 ENCOUNTER — Ambulatory Visit: Attending: Nurse Practitioner | Admitting: Nurse Practitioner

## 2023-12-06 ENCOUNTER — Encounter: Payer: Self-pay | Admitting: Nurse Practitioner

## 2023-12-06 VITALS — BP 110/56 | HR 62 | Ht 62.5 in | Wt 171.1 lb

## 2023-12-06 DIAGNOSIS — N182 Chronic kidney disease, stage 2 (mild): Secondary | ICD-10-CM

## 2023-12-06 DIAGNOSIS — I493 Ventricular premature depolarization: Secondary | ICD-10-CM

## 2023-12-06 DIAGNOSIS — I2510 Atherosclerotic heart disease of native coronary artery without angina pectoris: Secondary | ICD-10-CM | POA: Diagnosis not present

## 2023-12-06 DIAGNOSIS — I471 Supraventricular tachycardia, unspecified: Secondary | ICD-10-CM | POA: Diagnosis not present

## 2023-12-06 DIAGNOSIS — I10 Essential (primary) hypertension: Secondary | ICD-10-CM | POA: Diagnosis not present

## 2023-12-06 DIAGNOSIS — Z79899 Other long term (current) drug therapy: Secondary | ICD-10-CM

## 2023-12-06 DIAGNOSIS — R002 Palpitations: Secondary | ICD-10-CM

## 2023-12-06 DIAGNOSIS — I340 Nonrheumatic mitral (valve) insufficiency: Secondary | ICD-10-CM

## 2023-12-06 NOTE — Progress Notes (Signed)
 Office Visit    Patient Name: Patricia Gomez Date of Encounter: 12/06/2023  Primary Care Provider:  Sampson Ethridge DELENA, MD Primary Cardiologist:  Patricia Cave, MD  Chief Complaint    78 y.o. female with a history of chest pain, nonobstructive CAD, hypertension, hyperlipidemia, PSVT, moderate mitral regurgitation, diastolic dysfunction, CKD 2, and remote tobacco abuse, who presents for f/u related to HTN and palpitations.   Past Medical History   Subjective   Past Medical History:  Diagnosis Date   Anemia    Anxiety    Asthma    CAD (nonobstructive)    a. 04/2023 Cor CTA: Ca2+ = 153 (63rd%'ile). LAD 25, otw nl cors.   CKD (chronic kidney disease), stage II    COPD (chronic obstructive pulmonary disease) (HCC)    Depression    Diastolic dysfunction    a. 01/2016 Echo: EF 55-60%; b. 04/2023 Echo: EF 60-65%, no rwma, GrI DD, nl RV fxn, RVSP 40.50mmHg, mildly dil LA, mod MR, mild AI.   DJD (degenerative joint disease) of knee    GERD (gastroesophageal reflux disease)    Hx of migraines    Hyperlipidemia    Hypertension    Incontinence    Moderate mitral regurgitation    a. 04/2023 Echo: Mod MR.   Neuropathy    Osteopenia    Panic attack    PSVT (paroxysmal supraventricular tachycardia) (HCC)    a. 04/2023 Zio: Predominantly sinus rhythm (49-207, average 72).  10 SVT runs (longest/fastest 15 beats at 182 with max rate of 207).  4.4% PVC burden.   Pulmonary nodules    a. 04/2023 CTA: small pulm nodules, largest 5mm.  F/u non-contrast chest CT in 1 yr (long smoking hx).   PVC's (premature ventricular contractions)    a. 04/2023 Zio: 4.4% PVC burden.   Past Surgical History:  Procedure Laterality Date   ABDOMINAL HYSTERECTOMY     BREAST ENHANCEMENT SURGERY     CARPOMETACARPAL (CMC) FUSION OF THUMB Left 05/21/2015   Procedure: CARPOMETACARPAL (CMC) FUSION OF THUMB;  Surgeon: Patricia JINNY Maltos, MD;  Location: [facility];  Service: Orthopedics;  Laterality: Left;   EYE  SURGERY Bilateral 2013   GANGLION CYST EXCISION Left 05/21/2015   Procedure: REMOVAL GANGLION OF WRIST;  Surgeon: Patricia JINNY Maltos, MD;  Location: [facility];  Service: Orthopedics;  Laterality: Left;   INTRAOCULAR LENS INSERTION Bilateral    JOINT REPLACEMENT     bilateral knees   OOPHORECTOMY     with tumor removed before hysterectomy   OPEN REDUCTION INTERNAL FIXATION (ORIF) DISTAL RADIAL FRACTURE Right 03/12/2021   Procedure: OPEN REDUCTION INTERNAL FIXATION (ORIF) DISTAL RADIAL FRACTURE;  Surgeon: Patricia Sharper, MD;  Location: [facility];  Service: Orthopedics;  Laterality: Right;   REPLACEMENT TOTAL KNEE BILATERAL Bilateral 2011   TUBAL LIGATION      Allergies  Allergies  Allergen Reactions   Penicillins Anaphylaxis, Rash and Other (See Comments)    Has patient had a PCN reaction causing immediate rash, facial/tongue/throat swelling, SOB or lightheadedness with hypotension: Yes Has patient had a PCN reaction causing severe rash involving mucus membranes or skin necrosis: No Has patient had a PCN reaction that required hospitalization No Has patient had a PCN reaction occurring within the last 10 years: No If all of the above answers are NO, then may proceed with Cephalosporin use.   Flagyl  [Metronidazole ] Nausea And Vomiting   Losartan     GI upset   Prednisone Diarrhea and Nausea And Vomiting  History of Present Illness      78 y.o. y/o female with a history of chest pain, nonobstructive CAD, hypertension, hyperlipidemia, PSVT, moderate mitral regurgitation, diastolic dysfunction, CKD 2, and remote tobacco abuse.  She established care with Patricia Gomez in November 2024, due to a several month history of intermittent chest pain, labile hypertension, orthostatic lightheadedness, and a 5-year history of palpitations and irregular heartbeat.  She was hypertensive and placed on amlodipine  5 mg daily.  She subsequently underwent echocardiogram which showed an EF of 60 to 65% with an  RVSP of 40.8 mmHg, mildly dilated LA, moderate MR, and mild AI.  Coronary CT angiogram showed a calcium  score of 153 (63rd percentile), with mild proximal LAD stenosis of 25%, and otherwise normal coronary arteries.  She was incidentally noted to have small pulmonary nodules measuring up to 5 mm on CT with recommendation for follow-up noncontrast CT in 12 months.  Event monitoring showed predominantly sinus rhythm with 10 runs of SVT up to 15 beats at a maximum rate of 207, and a 4.4% PVC burden.  She was placed on metoprolol  therapy.      Patricia Gomez was last seen in cardiology clinic in March 2025, at which time she reported more frequent elevations in heart rates in the mornings, sometimes trending in the 90s to low 100s, associated with palpitations.  Symptoms seem to resolve after morning dose of metoprolol .  Metoprolol  XL was increased to 25 mg twice daily.  She subsequently noted improvement in palpitations.  She was placed on trazodone by her neurologist in the setting of difficulty sleeping and at the 40 mg dose, noted return of a.m. palpitations.  This was reduced back to 20 mg nightly and though her sleep has not improved yet, her palpitations in the mornings have resolved.  She has chronic, stable dyspnea on exertion.  She denies chest pain, PND, orthopnea, dizziness, syncope, edema, or early satiety. Objective   Home Medications    Current Outpatient Medications  Medication Sig Dispense Refill   acetaminophen  (TYLENOL ) 500 MG tablet Take 500 mg by mouth in the morning, at noon, and at bedtime.     amLODipine  (NORVASC ) 10 MG tablet Take 1 tablet (10 mg total) by mouth daily. 90 tablet 1   aspirin  EC 81 MG tablet Take 81 mg by mouth daily.     atorvastatin  (LIPITOR) 10 MG tablet Take 1 tablet (10 mg total) by mouth daily. 90 tablet 3   Azelastine HCl 137 MCG/SPRAY SOLN Place 1 spray into both nostrils daily.     BREO ELLIPTA 200-25 MCG/ACT AEPB Inhale 1 puff into the lungs daily.     EFFEXOR   XR 75 MG 24 hr capsule Take 75 mg by mouth in the morning and at bedtime.     fexofenadine (ALLEGRA) 60 MG tablet Take 60 mg by mouth 2 (two) times daily.     lidocaine  (LIDODERM ) 5 % Place 2 patches onto the skin daily.     metoprolol  succinate (TOPROL  XL) 25 MG 24 hr tablet Take 1 tablet (25 mg total) by mouth 2 (two) times daily. 90 tablet 3   omeprazole (PRILOSEC) 20 MG capsule Take 20 mg by mouth daily.     ondansetron  (ZOFRAN ) 4 MG tablet Take 4 mg by mouth every 8 (eight) hours as needed for vomiting or nausea.     oxybutynin  (DITROPAN ) 5 MG tablet Take 5 mg by mouth at bedtime.     traMADol  (ULTRAM ) 50 MG tablet Take 50 mg  by mouth in the morning, at noon, in the evening, and at bedtime.     traZODone (DESYREL) 50 MG tablet Take 50 mg by mouth at bedtime.     triamcinolone  cream (KENALOG ) 0.1 % Apply 1 application topically 2 (two) times daily as needed (sores).     albuterol  (PROVENTIL  HFA;VENTOLIN  HFA) 108 (90 Base) MCG/ACT inhaler Inhale 2 puffs into the lungs every 6 (six) hours as needed for wheezing or shortness of breath. 1 Inhaler 0   albuterol  (PROVENTIL ) (2.5 MG/3ML) 0.083% nebulizer solution Take 2.5 mg by nebulization every 4 (four) hours as needed for wheezing or shortness of breath.     ibuprofen  (ADVIL ) 200 MG tablet Take 200 mg by mouth 3 (three) times daily.     No current facility-administered medications for this visit.     Physical Exam    VS:  BP (!) 110/56 (BP Location: Left Arm, Patient Position: Sitting, Cuff Size: Normal)   Pulse 62   Ht 5' 2.5 (1.588 m)   Wt 171 lb 2 oz (77.6 kg)   SpO2 93%   BMI 30.80 kg/m  , BMI Body mass index is 30.8 kg/m.          GEN: Well nourished, well developed, in no acute distress. HEENT: normal. Neck: Supple, no JVD, carotid bruits, or masses. Cardiac: RRR, no murmurs, rubs, or gallops. No clubbing, cyanosis, edema.  Radials 2+/PT 2+ and equal bilaterally.  Respiratory:  Respirations regular and unlabored, clear to  auscultation bilaterally. GI: Soft, nontender, nondistended, BS + x 4. MS: no deformity or atrophy. Skin: warm and dry, no rash. Neuro:  Strength and sensation are intact. Psych: Normal affect.  Accessory Clinical Findings    ECG personally reviewed by me today - EKG Interpretation Date/Time:  Wednesday December 06 2023 15:45:39 EDT Ventricular Rate:  62 PR Interval:  202 QRS Duration:  90 QT Interval:  390 QTC Calculation: 395 R Axis:   -14  Text Interpretation: Normal sinus rhythm Nonspecific T wave abnormality Confirmed by Vivienne Bruckner [phone]) on 12/06/2023 4:01:45 PM  - no acute changes.  Lab Results  Component Value Date   WBC 8.5 03/09/2021   HGB 14.3 03/12/2021   HCT 42.0 03/12/2021   MCV 87.1 03/09/2021   PLT 314 09/12/2022   Lab Results  Component Value Date   CREATININE 0.94 07/05/2023   BUN 16 07/05/2023   NA 141 07/05/2023   K 3.5 07/05/2023   CL 101 07/05/2023   CO2 24 07/05/2023   Lab Results  Component Value Date   ALT 13 (L) 10/29/2015   AST 18 10/29/2015   ALKPHOS 160 (H) 10/29/2015   BILITOT 0.5 10/29/2015    Lab Results  Component Value Date   TSH 2.905 10/13/2015       Assessment & Plan    1.  Palpitations/PSVT/PVCs: Event monitoring in November 2024 showed a 4.4% PVC burden and 10 runs of SVT.  At her last visit, she complained of elevated heart rates in the mornings.  This has resolved with titration of Toprol -XL to 25 mg twice daily.  2.  Precordial chest pain/nonobstructive CAD: Coronary CT angiogram in November 2024 showed mild, nonobstructive LAD disease and otherwise normal coronary arteries.  She has not been having any chest pain.  She has chronic, stable dyspnea on exertion in the setting of higher level activity or extreme heat.  She remains on aspirin , statin, beta-blocker, and calcium  channel blocker.  3.  Primary hypertension: Blood pressure improved following  titration of beta-blocker.  Continue current dose of beta-blocker  and calcium  channel blocker.  4.  Moderate mitral regurgitation: Noted on echocardiogram last year.  I do not appreciate a murmur on examination.  Plan for follow-up echo next year.  5.  Pulmonary nodules: Incidentally noted on coronary CT angiogram measuring up to 5 mm.  Long history tobacco abuse from age 78 until her late 48s.  She should have a noncontrast CT November 2025 to assess stability.  6.  Stage II chronic kidney disease: She is due for follow-up lab work and we have placed an order today.  She plans to come back later this week to have it completed.  7.  Hyperlipidemia: On statin in the setting of CAD noted on coronary CT angiogram.  She is overdue for lipids and LFTs.  We previously placed orders however she never had them performed.  Replacing orders and she says she will come back later this week.  8.  Disposition: Follow-up lipids and complete metabolic panel.  Follow-up in clinic in 6 months or sooner if necessary.  Lonni Meager, NP 12/06/2023, 4:01 PM

## 2023-12-06 NOTE — Patient Instructions (Signed)
 Medication Instructions:  Your physician recommends that you continue on your current medications as directed. Please refer to the Current Medication list given to you today.   *If you need a refill on your cardiac medications before your next appointment, please call your pharmacy*  Lab Work: Your provider would like for you to return in 1 or 2 days to have the following labs drawn: CMeT and Lipid panel.   Please go to [facility] [address] ([facility] [address], [place] [address] You do not need an appointment.  They are open from 8 am- 4:30 pm.  Lunch from 1:00 pm- 2:00 pm You DO need to be fasting.  If you have labs (blood work) drawn today and your tests are completely normal, you will receive your results only by: MyChart Message (if you have MyChart) OR A paper copy in the mail If you have any lab test that is abnormal or we need to change your treatment, we will call you to review the results.  Follow-Up: At [facility], you and your health needs are our priority.  As part of our continuing mission to provide you with exceptional heart care, our providers are all part of one team.  This team includes your primary Cardiologist (physician) and Advanced Practice Providers or APPs (Physician Assistants and Nurse Practitioners) who all work together to provide you with the care you need, when you need it.  Your next appointment:   6 month(s)  Provider:   You may see Redell Cave, MD or Lonni Meager, NP

## 2023-12-09 LAB — COMPREHENSIVE METABOLIC PANEL WITH GFR
ALT: 9 IU/L (ref 0–32)
AST: 18 IU/L (ref 0–40)
Albumin: 3.6 g/dL — ABNORMAL LOW (ref 3.8–4.8)
Alkaline Phosphatase: 245 IU/L — ABNORMAL HIGH (ref 44–121)
BUN/Creatinine Ratio: 12 (ref 12–28)
BUN: 11 mg/dL (ref 8–27)
Bilirubin Total: 0.3 mg/dL (ref 0.0–1.2)
CO2: 23 mmol/L (ref 20–29)
Calcium: 8.6 mg/dL — ABNORMAL LOW (ref 8.7–10.3)
Chloride: 103 mmol/L (ref 96–106)
Creatinine, Ser: 0.93 mg/dL (ref 0.57–1.00)
Globulin, Total: 2.8 g/dL (ref 1.5–4.5)
Glucose: 98 mg/dL (ref 70–99)
Potassium: 4 mmol/L (ref 3.5–5.2)
Sodium: 142 mmol/L (ref 134–144)
Total Protein: 6.4 g/dL (ref 6.0–8.5)
eGFR: 63 mL/min/{1.73_m2} (ref >59)

## 2023-12-09 LAB — LIPID PANEL
Chol/HDL Ratio: 3.6 ratio (ref 0.0–4.4)
Cholesterol, Total: 132 mg/dL (ref 100–199)
HDL: 37 mg/dL — ABNORMAL LOW (ref >39)
LDL Chol Calc (NIH): 79 mg/dL (ref 0–99)
Triglycerides: 83 mg/dL (ref 0–149)
VLDL Cholesterol Cal: 16 mg/dL (ref 5–40)

## 2023-12-11 ENCOUNTER — Ambulatory Visit: Payer: Self-pay | Admitting: Nurse Practitioner

## 2023-12-11 DIAGNOSIS — I2510 Atherosclerotic heart disease of native coronary artery without angina pectoris: Secondary | ICD-10-CM

## 2023-12-11 DIAGNOSIS — E785 Hyperlipidemia, unspecified: Secondary | ICD-10-CM

## 2023-12-11 MED ORDER — ATORVASTATIN CALCIUM 40 MG PO TABS: 40.0000 mg | ORAL_TABLET | Freq: Every day | ORAL | 3 refills | Status: AC

## 2024-02-28 ENCOUNTER — Other Ambulatory Visit: Payer: Self-pay | Admitting: Internal Medicine

## 2024-02-28 DIAGNOSIS — K760 Fatty (change of) liver, not elsewhere classified: Secondary | ICD-10-CM

## 2024-02-28 DIAGNOSIS — M1712 Unilateral primary osteoarthritis, left knee: Secondary | ICD-10-CM

## 2024-02-29 ENCOUNTER — Ambulatory Visit
Admission: RE | Admit: 2024-02-29 | Discharge: 2024-02-29 | Disposition: A | Discharge status: Home / Self Care | Source: Ambulatory Visit | Attending: Internal Medicine | Admitting: Internal Medicine

## 2024-02-29 DIAGNOSIS — M1712 Unilateral primary osteoarthritis, left knee: Secondary | ICD-10-CM

## 2024-03-05 ENCOUNTER — Ambulatory Visit

## 2024-03-11 ENCOUNTER — Other Ambulatory Visit: Payer: Self-pay | Admitting: Nurse Practitioner

## 2024-03-11 ENCOUNTER — Other Ambulatory Visit: Payer: Self-pay

## 2024-03-11 MED ORDER — AMLODIPINE BESYLATE 10 MG PO TABS: 10.0000 mg | ORAL_TABLET | Freq: Every day | ORAL | 1 refills | Status: DC

## 2024-03-29 ENCOUNTER — Telehealth: Payer: Self-pay | Admitting: Nurse Practitioner

## 2024-03-29 MED ORDER — METOPROLOL SUCCINATE ER 25 MG PO TB24: 25.0000 mg | ORAL_TABLET | Freq: Two times a day (BID) | ORAL | 3 refills | Status: DC

## 2024-03-29 NOTE — Telephone Encounter (Signed)
 *  STAT* If patient is at the pharmacy, call can be transferred to refill team.   1. Which medications need to be refilled? (please list name of each medication and dose if known)   metoprolol  succinate (TOPROL  XL) 25 MG 24 hr tablet     2. Would you like to learn more about the convenience, safety, & potential cost savings by using the [facility]? No      3. Are you open to using the [facility] (Type [facility]. ). No    4. Which pharmacy/location (including street and city if local pharmacy) is medication to be sent to?  [facility] - [place], [place] - [address]     5. Do they need a 30 day or 90 day supply? 90 days    Pharmacy requested 90 day supply/ 180 tablets for the patient

## 2024-03-29 NOTE — Telephone Encounter (Signed)
 Refill sent.

## 2024-04-12 ENCOUNTER — Ambulatory Visit
Admission: RE | Admit: 2024-04-12 | Discharge: 2024-04-12 | Disposition: A | Discharge status: Home / Self Care | Source: Ambulatory Visit | Attending: Internal Medicine | Admitting: Internal Medicine

## 2024-04-12 ENCOUNTER — Other Ambulatory Visit: Payer: Self-pay | Admitting: Internal Medicine

## 2024-04-12 DIAGNOSIS — M179 Osteoarthritis of knee, unspecified: Secondary | ICD-10-CM | POA: Diagnosis present

## 2024-04-12 DIAGNOSIS — K760 Fatty (change of) liver, not elsewhere classified: Secondary | ICD-10-CM | POA: Insufficient documentation

## 2024-05-21 ENCOUNTER — Encounter: Payer: Self-pay | Admitting: Nurse Practitioner

## 2024-05-21 NOTE — Telephone Encounter (Signed)
 This encounter was created in error - please disregard.

## 2024-06-26 ENCOUNTER — Encounter: Payer: Self-pay | Admitting: Nurse Practitioner

## 2024-06-26 ENCOUNTER — Ambulatory Visit: Admitting: Nurse Practitioner

## 2024-06-26 ENCOUNTER — Ambulatory Visit: Attending: Nurse Practitioner | Admitting: Nurse Practitioner

## 2024-06-26 VITALS — BP 130/60 | HR 65 | Ht 62.0 in | Wt 170.2 lb

## 2024-06-26 DIAGNOSIS — I5189 Other ill-defined heart diseases: Secondary | ICD-10-CM

## 2024-06-26 DIAGNOSIS — E785 Hyperlipidemia, unspecified: Secondary | ICD-10-CM | POA: Diagnosis not present

## 2024-06-26 DIAGNOSIS — I10 Essential (primary) hypertension: Secondary | ICD-10-CM

## 2024-06-26 DIAGNOSIS — I471 Supraventricular tachycardia, unspecified: Secondary | ICD-10-CM

## 2024-06-26 DIAGNOSIS — I340 Nonrheumatic mitral (valve) insufficiency: Secondary | ICD-10-CM | POA: Diagnosis not present

## 2024-06-26 DIAGNOSIS — R600 Localized edema: Secondary | ICD-10-CM

## 2024-06-26 DIAGNOSIS — R002 Palpitations: Secondary | ICD-10-CM | POA: Diagnosis not present

## 2024-06-26 DIAGNOSIS — I493 Ventricular premature depolarization: Secondary | ICD-10-CM | POA: Diagnosis not present

## 2024-06-26 DIAGNOSIS — I2510 Atherosclerotic heart disease of native coronary artery without angina pectoris: Secondary | ICD-10-CM | POA: Diagnosis not present

## 2024-06-26 DIAGNOSIS — N182 Chronic kidney disease, stage 2 (mild): Secondary | ICD-10-CM | POA: Diagnosis not present

## 2024-06-26 MED ORDER — METOPROLOL SUCCINATE ER 50 MG PO TB24: 50.0000 mg | ORAL_TABLET | Freq: Two times a day (BID) | ORAL | 1 refills | Status: AC

## 2024-06-26 MED ORDER — AMLODIPINE BESYLATE 5 MG PO TABS: 10.0000 mg | ORAL_TABLET | Freq: Every day | ORAL | 1 refills | Status: DC

## 2024-06-26 NOTE — Progress Notes (Unsigned)
 "    Office Visit    Patient Name: Patricia Gomez Date of Encounter: 06/26/2024  Primary Care Provider:  Sampson Ethridge DELENA, MD Primary Cardiologist:  Redell Cave, MD    Chief Complaint    79 y.o. female with a history of chest pain, nonobstructive CAD, hypertension, hyperlipidemia, PSVT, moderate mitral regurgitation, diastolic dysfunction, CKD 2, and remote tobacco abuse, who presents for f/u related to HTN and palpitations.   Past Medical History   Subjective   Past Medical History:  Diagnosis Date   Anemia    Anxiety    Asthma    CAD (nonobstructive)    a. 04/2023 Cor CTA: Ca2+ = 153 (63rd%'ile). LAD 25, otw nl cors.   CKD (chronic kidney disease), stage II    COPD (chronic obstructive pulmonary disease) (HCC)    Depression    Diastolic dysfunction    a. 01/2016 Echo: EF 55-60%; b. 04/2023 Echo: EF 60-65%, no rwma, GrI DD, nl RV fxn, RVSP 40.39mmHg, mildly dil LA, mod MR, mild AI.   DJD (degenerative joint disease) of knee    GERD (gastroesophageal reflux disease)    Hx of migraines    Hyperlipidemia    Hypertension    Incontinence    Moderate mitral regurgitation    a. 04/2023 Echo: Mod MR.   Neuropathy    Osteopenia    Panic attack    PSVT (paroxysmal supraventricular tachycardia)    a. 04/2023 Zio: Predominantly sinus rhythm (49-207, average 72).  10 SVT runs (longest/fastest 15 beats at 182 with max rate of 207).  4.4% PVC burden.   Pulmonary nodules    a. 04/2023 CTA: small pulm nodules, largest 5mm.  F/u non-contrast chest CT in 1 yr (long smoking hx).   PVC's (premature ventricular contractions)    a. 04/2023 Zio: 4.4% PVC burden.   Past Surgical History:  Procedure Laterality Date   ABDOMINAL HYSTERECTOMY     BREAST ENHANCEMENT SURGERY     CARPOMETACARPAL (CMC) FUSION OF THUMB Left 05/21/2015   Procedure: CARPOMETACARPAL (CMC) FUSION OF THUMB;  Surgeon: Norleen JINNY Maltos, MD;  Location: [facility];  Service: Orthopedics;  Laterality: Left;   EYE  SURGERY Bilateral 2013   GANGLION CYST EXCISION Left 05/21/2015   Procedure: REMOVAL GANGLION OF WRIST;  Surgeon: Norleen JINNY Maltos, MD;  Location: [facility];  Service: Orthopedics;  Laterality: Left;   INTRAOCULAR LENS INSERTION Bilateral    JOINT REPLACEMENT     bilateral knees   OOPHORECTOMY     with tumor removed before hysterectomy   OPEN REDUCTION INTERNAL FIXATION (ORIF) DISTAL RADIAL FRACTURE Right 03/12/2021   Procedure: OPEN REDUCTION INTERNAL FIXATION (ORIF) DISTAL RADIAL FRACTURE;  Surgeon: Kathlynn Sharper, MD;  Location: [facility];  Service: Orthopedics;  Laterality: Right;   REPLACEMENT TOTAL KNEE BILATERAL Bilateral 2011   TUBAL LIGATION      Allergies  Allergies[1]     History of Present Illness      79 y.o. y/o female with a history of chest pain, nonobstructive CAD, hypertension, hyperlipidemia, PSVT, moderate mitral regurgitation, diastolic dysfunction, CKD 2, and remote tobacco abuse.  She established care with Dr. Cave in November 2024, due to a several month history of intermittent chest pain, labile hypertension, orthostatic lightheadedness, and a 5-year history of palpitations and irregular heartbeat.  She was hypertensive and placed on amlodipine  5 mg daily.  She subsequently underwent echocardiogram which showed an EF of 60 to 65% with an RVSP of 40.8 mmHg, mildly dilated LA, moderate  MR, and mild AI.  Coronary CT angiogram showed a calcium  score of 153 (63rd percentile), with mild proximal LAD stenosis of 25%, and otherwise normal coronary arteries.  She was incidentally noted to have small pulmonary nodules measuring up to 5 mm on CT with recommendation for follow-up noncontrast CT in 12 months.  Event monitoring showed predominantly sinus rhythm with 10 runs of SVT up to 15 beats at a maximum rate of 207, and a 4.4% PVC burden.  She was placed on metoprolol  therapy and this was later titrated to metoprolol  XL 25 mg twice daily with improvement in palpitations.     Ms.  Imes was last seen in cardiology clinic in June 2025, at which time she was doing well.  ***f/u echo Objective   Home Medications    Current Outpatient Medications  Medication Sig Dispense Refill   acetaminophen  (TYLENOL ) 500 MG tablet Take 500 mg by mouth in the morning, at noon, and at bedtime.     albuterol  (PROVENTIL  HFA;VENTOLIN  HFA) 108 (90 Base) MCG/ACT inhaler Inhale 2 puffs into the lungs every 6 (six) hours as needed for wheezing or shortness of breath. 1 Inhaler 0   albuterol  (PROVENTIL ) (2.5 MG/3ML) 0.083% nebulizer solution Take 2.5 mg by nebulization every 4 (four) hours as needed for wheezing or shortness of breath.     amLODipine  (NORVASC ) 10 MG tablet TAKE 1 TABLET BY MOUTH DAILY 90 tablet 1   amLODipine  (NORVASC ) 10 MG tablet Take 1 tablet (10 mg total) by mouth daily. 90 tablet 1   aspirin  EC 81 MG tablet Take 81 mg by mouth daily.     atorvastatin  (LIPITOR) 40 MG tablet Take 1 tablet (40 mg total) by mouth daily. 90 tablet 3   Azelastine HCl 137 MCG/SPRAY SOLN Place 1 spray into both nostrils daily.     BREO ELLIPTA 200-25 MCG/ACT AEPB Inhale 1 puff into the lungs daily.     EFFEXOR  XR 75 MG 24 hr capsule Take 75 mg by mouth in the morning and at bedtime.     fexofenadine (ALLEGRA) 60 MG tablet Take 60 mg by mouth 2 (two) times daily.     ibuprofen  (ADVIL ) 200 MG tablet Take 200 mg by mouth 3 (three) times daily.     lidocaine  (LIDODERM ) 5 % Place 2 patches onto the skin daily.     metoprolol  succinate (TOPROL  XL) 25 MG 24 hr tablet Take 1 tablet (25 mg total) by mouth 2 (two) times daily. 180 tablet 3   omeprazole (PRILOSEC) 20 MG capsule Take 20 mg by mouth daily.     ondansetron  (ZOFRAN ) 4 MG tablet Take 4 mg by mouth every 8 (eight) hours as needed for vomiting or nausea.     oxybutynin  (DITROPAN ) 5 MG tablet Take 5 mg by mouth at bedtime.     traMADol  (ULTRAM ) 50 MG tablet Take 50 mg by mouth in the morning, at noon, in the evening, and at bedtime.     traZODone  (DESYREL) 50 MG tablet Take 50 mg by mouth at bedtime.     triamcinolone  cream (KENALOG ) 0.1 % Apply 1 application topically 2 (two) times daily as needed (sores).     No current facility-administered medications for this visit.     Physical Exam    VS:  There were no vitals taken for this visit. , BMI There is no height or weight on file to calculate BMI.          GEN: Well nourished, well developed,  in no acute distress. HEENT: normal. Neck: Supple, no JVD, carotid bruits, or masses. Cardiac: RRR, no murmurs, rubs, or gallops. No clubbing, cyanosis, edema.  Radials 2+/PT 2+ and equal bilaterally.  Respiratory:  Respirations regular and unlabored, clear to auscultation bilaterally. GI: Soft, nontender, nondistended, BS + x 4. MS: no deformity or atrophy. Skin: warm and dry, no rash. Neuro:  Strength and sensation are intact. Psych: Normal affect.  Accessory Clinical Findings    ECG personally reviewed by me today -    *** - no acute changes.  Lab Results  Component Value Date   WBC 8.5 03/09/2021   HGB 14.3 03/12/2021   HCT 42.0 03/12/2021   MCV 87.1 03/09/2021   PLT 314 09/12/2022   Lab Results  Component Value Date   CREATININE 0.93 12/08/2023   BUN 11 12/08/2023   NA 142 12/08/2023   K 4.0 12/08/2023   CL 103 12/08/2023   CO2 23 12/08/2023   Lab Results  Component Value Date   ALT 9 12/08/2023   AST 18 12/08/2023   ALKPHOS 245 (H) 12/08/2023   BILITOT 0.3 12/08/2023   Lab Results  Component Value Date   CHOL 132 12/08/2023   HDL 37 (L) 12/08/2023   LDLCALC 79 12/08/2023   TRIG 83 12/08/2023   CHOLHDL 3.6 12/08/2023    No results found for: HGBA1C Lab Results  Component Value Date   TSH 2.905 10/13/2015       Assessment & Plan    1.  ***  Lonni Meager, NP 06/26/2024, 1:02 PM     [1]  Allergies Allergen Reactions   Penicillins Anaphylaxis, Rash and Other (See Comments)    Has patient had a PCN reaction causing immediate rash,  facial/tongue/throat swelling, SOB or lightheadedness with hypotension: Yes Has patient had a PCN reaction causing severe rash involving mucus membranes or skin necrosis: No Has patient had a PCN reaction that required hospitalization No Has patient had a PCN reaction occurring within the last 10 years: No If all of the above answers are NO, then may proceed with Cephalosporin use.   Flagyl  [Metronidazole ] Nausea And Vomiting   Losartan     GI upset   Prednisone Diarrhea and Nausea And Vomiting   "

## 2024-06-26 NOTE — Progress Notes (Signed)
 "    Office Visit    Patient Name: Patricia Gomez Date of Encounter: 06/26/2024  Primary Care Provider:  Sampson Ethridge DELENA, MD Primary Cardiologist:  Redell Cave, MD    Chief Complaint    79 y.o. female with a history of chest pain, nonobstructive CAD, hypertension, hyperlipidemia, PSVT, moderate mitral regurgitation, diastolic dysfunction, CKD 2, and remote tobacco abuse, who presents for f/u related to lower extremity swelling.  Past Medical History   Subjective   Past Medical History:  Diagnosis Date   Anemia    Anxiety    Asthma    CAD (nonobstructive)    a. 04/2023 Cor CTA: Ca2+ = 153 (63rd%'ile). LAD 25, otw nl cors.   CKD (chronic kidney disease), stage II    COPD (chronic obstructive pulmonary disease) (HCC)    Depression    Diastolic dysfunction    a. 01/2016 Echo: EF 55-60%; b. 04/2023 Echo: EF 60-65%, no rwma, GrI DD, nl RV fxn, RVSP 40.64mmHg, mildly dil LA, mod MR, mild AI.   DJD (degenerative joint disease) of knee    GERD (gastroesophageal reflux disease)    Hx of migraines    Hyperlipidemia    Hypertension    Incontinence    Moderate mitral regurgitation    a. 04/2023 Echo: Mod MR.   Neuropathy    Osteopenia    Panic attack    PSVT (paroxysmal supraventricular tachycardia)    a. 04/2023 Zio: Predominantly sinus rhythm (49-207, average 72).  10 SVT runs (longest/fastest 15 beats at 182 with max rate of 207).  4.4% PVC burden.   Pulmonary nodules    a. 04/2023 CTA: small pulm nodules, largest 5mm.  F/u non-contrast chest CT in 1 yr (long smoking hx).   PVC's (premature ventricular contractions)    a. 04/2023 Zio: 4.4% PVC burden.   Past Surgical History:  Procedure Laterality Date   ABDOMINAL HYSTERECTOMY     BREAST ENHANCEMENT SURGERY     CARPOMETACARPAL (CMC) FUSION OF THUMB Left 05/21/2015   Procedure: CARPOMETACARPAL (CMC) FUSION OF THUMB;  Surgeon: Norleen JINNY Maltos, MD;  Location: [facility];  Service: Orthopedics;  Laterality: Left;   EYE  SURGERY Bilateral 2013   GANGLION CYST EXCISION Left 05/21/2015   Procedure: REMOVAL GANGLION OF WRIST;  Surgeon: Norleen JINNY Maltos, MD;  Location: [facility];  Service: Orthopedics;  Laterality: Left;   INTRAOCULAR LENS INSERTION Bilateral    JOINT REPLACEMENT     bilateral knees   OOPHORECTOMY     with tumor removed before hysterectomy   OPEN REDUCTION INTERNAL FIXATION (ORIF) DISTAL RADIAL FRACTURE Right 03/12/2021   Procedure: OPEN REDUCTION INTERNAL FIXATION (ORIF) DISTAL RADIAL FRACTURE;  Surgeon: Kathlynn Sharper, MD;  Location: [facility];  Service: Orthopedics;  Laterality: Right;   REPLACEMENT TOTAL KNEE BILATERAL Bilateral 2011   TUBAL LIGATION      Allergies  Allergies[1]     History of Present Illness      79 y.o. y/o female with a history of chest pain, nonobstructive CAD, hypertension, hyperlipidemia, PSVT, moderate mitral regurgitation, diastolic dysfunction, CKD 2, and remote tobacco abuse.  She established care with Dr. Cave in November 2024, due to a several month history of intermittent chest pain, labile hypertension, orthostatic lightheadedness, and a 5-year history of palpitations and irregular heartbeat.  She was hypertensive and placed on amlodipine  5 mg daily.  She subsequently underwent echocardiogram which showed an EF of 60 to 65% with an RVSP of 40.8 mmHg, mildly dilated LA, moderate MR,  and mild AI.  Coronary CT angiogram showed a calcium  score of 153 (63rd percentile), with mild proximal LAD stenosis of 25%, and otherwise normal coronary arteries.  She was incidentally noted to have small pulmonary nodules measuring up to 5 mm on CT with recommendation for follow-up noncontrast CT in 12 months.  Event monitoring showed predominantly sinus rhythm with 10 runs of SVT up to 15 beats at a maximum rate of 207, and a 4.4% PVC burden.  She was placed on metoprolol  therapy and this was later titrated to metoprolol  XL 25 mg twice daily with improvement in palpitations.     Ms.  Puzzo was last seen in cardiology clinic in June 2025, at which time she was doing well.  She says that since October 2025, she has been noticing bilateral lower extremity swelling.  Her primary care provider placed her on Lasix  20 mg daily.  Lab work in November notable for a normal BNP of 92.7.  She says that on Lasix  therapy, she has noted some improvement in swelling though it never fully goes away.  She has some degree of chronic dyspnea on exertion though this is overall stable.  She is very careful with her salt intake and notes that she is generally active throughout the day and will try to keep her legs elevated when she is sitting.  She has tried compression socks before but due to peripheral neuropathy, she is not able to tolerate.  She does not experience chest pain.  She continues to experience palpitations first thing in the morning, usually shortly after getting out of bed, lasting for a few minutes, resolving spontaneously with rest.  She denies PND, orthopnea, dizziness, syncope, or early satiety.  Objective   Home Medications    Current Outpatient Medications  Medication Sig Dispense Refill   albuterol  (PROVENTIL  HFA;VENTOLIN  HFA) 108 (90 Base) MCG/ACT inhaler Inhale 2 puffs into the lungs every 6 (six) hours as needed for wheezing or shortness of breath. 1 Inhaler 0   albuterol  (PROVENTIL ) (2.5 MG/3ML) 0.083% nebulizer solution Take 2.5 mg by nebulization every 4 (four) hours as needed for wheezing or shortness of breath.     amLODipine  (NORVASC ) 10 MG tablet TAKE 1 TABLET BY MOUTH DAILY 90 tablet 1   atorvastatin  (LIPITOR) 40 MG tablet Take 1 tablet (40 mg total) by mouth daily. 90 tablet 3   Azelastine HCl 137 MCG/SPRAY SOLN Place 1 spray into both nostrils daily.     BREO ELLIPTA 200-25 MCG/ACT AEPB Inhale 1 puff into the lungs daily.     EFFEXOR  XR 75 MG 24 hr capsule Take 75 mg by mouth in the morning and at bedtime.     fexofenadine (ALLEGRA) 60 MG tablet Take 60 mg by mouth 2  (two) times daily.     furosemide  (LASIX ) 20 MG tablet Take 20 mg by mouth daily.     hydrOXYzine  (ATARAX ) 25 MG tablet Take 25 mg by mouth 2 (two) times daily as needed.     lidocaine  (LIDODERM ) 5 % Place 2 patches onto the skin daily.     metoprolol  succinate (TOPROL  XL) 25 MG 24 hr tablet Take 1 tablet (25 mg total) by mouth 2 (two) times daily. 180 tablet 3   omeprazole (PRILOSEC) 20 MG capsule Take 20 mg by mouth daily.     ondansetron  (ZOFRAN ) 4 MG tablet Take 4 mg by mouth every 8 (eight) hours as needed for vomiting or nausea.     oxybutynin  (DITROPAN ) 5 MG tablet  Take 5 mg by mouth at bedtime.     traMADol  (ULTRAM ) 50 MG tablet Take 50 mg by mouth in the morning, at noon, in the evening, and at bedtime.     traZODone (DESYREL) 50 MG tablet Take 50 mg by mouth at bedtime.     triamcinolone  cream (KENALOG ) 0.1 % Apply 1 application topically 2 (two) times daily as needed (sores).     acetaminophen  (TYLENOL ) 500 MG tablet Take 500 mg by mouth in the morning, at noon, and at bedtime. (Patient not taking: Reported on 06/26/2024)     amLODipine  (NORVASC ) 10 MG tablet Take 1 tablet (10 mg total) by mouth daily. (Patient not taking: Reported on 06/26/2024) 90 tablet 1   ibuprofen  (ADVIL ) 200 MG tablet Take 200 mg by mouth 3 (three) times daily.     No current facility-administered medications for this visit.     Physical Exam    VS:  BP 130/60 (BP Location: Left Arm, Patient Position: Sitting, Cuff Size: Normal)   Pulse 65   Ht 5' 2 (1.575 m)   Wt 170 lb 4 oz (77.2 kg)   SpO2 94%   BMI 31.14 kg/m  , BMI Body mass index is 31.14 kg/m.          GEN: Well nourished, well developed, in no acute distress. HEENT: normal. Neck: Supple, no JVD, carotid bruits, or masses. Cardiac: RRR, no murmurs, rubs, or gallops. No clubbing, cyanosis, 1+ bilateral ankle edema.  Radials 2+/PT 2+ and equal bilaterally.  Respiratory:  Respirations regular and unlabored, clear to auscultation  bilaterally. GI: Soft, nontender, nondistended, BS + x 4. MS: no deformity or atrophy. Skin: warm and dry, no rash. Neuro:  Strength and sensation are intact. Psych: Normal affect.  Accessory Clinical Findings    ECG personally reviewed by me today - EKG Interpretation Date/Time:  Wednesday June 26 2024 14:17:05 EST Ventricular Rate:  65 PR Interval:  202 QRS Duration:  88 QT Interval:  408 QTC Calculation: 424 R Axis:   -6  Text Interpretation: Normal sinus rhythm Normal ECG Confirmed by Vivienne Bruckner [phone]) on 06/26/2024 2:43:52 PM  - no acute changes.  Labs from [facility] dated April 30, 2024:  Hemoglobin 9.9, hematocrit 32.3, WBC 7.6, platelets 351 Sodium 142, potassium 3.8, chloride 102, CO2 25, BUN 13, creatinine 0.7, glucose 87 BNP 92.7 Total protein 6.6, calcium  8.7, albumin 3.7 Total bilirubin 0.3, alkaline phosphatase 200, AST 16, ALT 9 Serum iron 33, TIBC 369, iron saturation 9%    Assessment & Plan    1.  Lower extremity edema/diastolic dysfunction: Since October 2025, patient has been experiencing mild lower extremity edema for which she has been taking Lasix  20 mg daily.  She is chronic, stable dyspnea on exertion and her weight is actually down compared to prior visits.  She has 1+ ankle edema on examination today.  She says she is careful with her salt intake and does try to keep her legs elevated.  She does not tolerate compression socks secondary to peripheral neuropathy.  Most recent echo November 2024 showed an EF of 60 to 65% with grade 1 diastolic dysfunction and an RVSP of 40.8 mmHg with moderate mitral regurgitation.  I note that she is on high dose amlodipine .  I am going to reduce this to 5 mg daily while adjusting beta-blocker (see below).  Follow-up echocardiogram.  2.  Moderate regurgitation: Noted on echo 2024.  I do not appreciate a murmur on examination.  Plan for follow-up  echo as outlined above.  3.  Precordial pain/nonobstructive CAD:  Coronary CT angiogram in November 2024 showed mild, nonobstructive LAD disease and otherwise normal coronary arteries.  She has not been experiencing chest pain and has stable, chronic dyspnea.  She remains on beta-blocker, statin, and calcium  channel blocker therapy.  Aspirin  is currently on hold in the setting of anemia for which she is pending GI evaluation.  4.  Primary hypertension: Blood pressure stable today at 130/60.  In the setting of ongoing morning palpitations and also lower extremity swelling, which may be secondary to amlodipine  therapy, I am increasing her beta-blocker to 50 mg twice daily while reducing her amlodipine  to 5 mg daily.  5.  Stage II chronic kidney disease: Normal creatinine of 0.7 on lab work in November 2025.  6.  Hyperlipidemia: She remains on atorvastatin  therapy in the setting of nonobstructive CAD noted on CT angiogram.  LFTs were normal in November.  LDL was 67 in August of last year.  7.  Normocytic anemia: H&H of 9.9 and 32.3 by lab work in November.  Stool card negative.  Serum iron normal at 33 with TIBC of 369.  She is pending GI evaluation.  8.  Pulmonary nodules: Incidentally noted on coronary CT angiogram in 2024 measuring up to 5 mm.  She has a long history of tobacco abuse from age 79 until her late 14s.  Recommendation was for noncontrast CT of the chest 1 year later, though this does not appear to have been performed.  If not performed through primary care prior to next follow-up, we can arrange.  9.  Palpitations/PVCs/PSVT: Prior monitoring showed 10 brief runs of SVT and also a 4.4% PVC burden.  She continues to experience what she describes as palpitations and elevated heart rates first thing in the morning that resolves within a few minutes of awakening.  Increasing Toprol -XL to 50 mg twice daily.  10.  Disposition: Follow-up echocardiogram.  Follow-up in clinic in 3 months or sooner if necessary.  Lonni Meager, NP 06/26/2024, 2:44 PM      [1]  Allergies Allergen Reactions   Penicillins Anaphylaxis, Other (See Comments), Rash and Dermatitis    Has patient had a PCN reaction causing immediate rash, facial/tongue/throat swelling, SOB or lightheadedness with hypotension: Yes  Has patient had a PCN reaction causing severe rash involving mucus membranes or skin necrosis: No  Has patient had a PCN reaction that required hospitalization No  Has patient had a PCN reaction occurring within the last 10 years: No  If all of the above answers are NO, then may proceed with Cephalosporin use.   Ace Inhibitors Cough   Gabapentin  Other (See Comments)    gabapentin    Lipoic Acid Nausea Only   Losartan Other (See Comments)    GI upset  losartan   Metronidazole  Nausea And Vomiting and Nausea Only   Prednisone Diarrhea and Nausea And Vomiting    prednisone   "

## 2024-06-26 NOTE — Patient Instructions (Signed)
 Medication Instructions:  Your physician recommends the following medication changes.  DECREASE: Amlodipine  to 5 MG daily   INCREASE: Toprol  XL to 50 MG twice daily.  *If you need a refill on your cardiac medications before your next appointment, please call your pharmacy*  Lab Work: No labs ordered today  If you have labs (blood work) drawn today and your tests are completely normal, you will receive your results only by: MyChart Message (if you have MyChart) OR A paper copy in the mail If you have any lab test that is abnormal or we need to change your treatment, we will call you to review the results.  Testing/Procedures: Your physician has requested that you have an echocardiogram. Echocardiography is a painless test that uses sound waves to create images of your heart. It provides your doctor with information about the size and shape of your heart and how well your hearts chambers and valves are working.   You may receive an ultrasound enhancing agent through an IV if needed to better visualize your heart during the echo. This procedure takes approximately one hour.  There are no restrictions for this procedure.  This will take place at [address] [facility] [address], [place] [address]  Please note: We ask at that you not bring children with you during ultrasound (echo/ vascular) testing. Due to room size and safety concerns, children are not allowed in the ultrasound rooms during exams. Our front office staff cannot provide observation of children in our lobby area while testing is being conducted. An adult accompanying a patient to their appointment will only be allowed in the ultrasound room at the discretion of the ultrasound technician under special circumstances. We apologize for any inconvenience.   Follow-Up: At [facility], you and your health needs are our priority.  As part of our continuing mission to provide you with exceptional heart care,  our providers are all part of one team.  This team includes your primary Cardiologist (physician) and Advanced Practice Providers or APPs (Physician Assistants and Nurse Practitioners) who all work together to provide you with the care you need, when you need it.  Your next appointment:   3 month(s)  Provider:   Redell Cave, MD or Lonni Meager, NP    We recommend signing up for the patient portal called MyChart.  Sign up information is provided on this After Visit Summary.  MyChart is used to connect with patients for Virtual Visits (Telemedicine).  Patients are able to view lab/test results, encounter notes, upcoming appointments, etc.  Non-urgent messages can be sent to your provider as well.   To learn more about what you can do with MyChart, go to [email or web address].

## 2024-06-27 ENCOUNTER — Telehealth: Payer: Self-pay | Admitting: Cardiology

## 2024-06-27 MED ORDER — AMLODIPINE BESYLATE 5 MG PO TABS: 5.0000 mg | ORAL_TABLET | Freq: Every day | ORAL | 3 refills | Status: AC

## 2024-06-27 NOTE — Telephone Encounter (Signed)
 Pt c/o medication issue:  1. Name of Medication:   amLODipine  (NORVASC ) 5 MG tablet    2. How are you currently taking this medication (dosage and times per day)? Take 2 tablets (10 mg total) by mouth daily   3. Are you having a reaction (difficulty breathing--STAT)? no  4. What is your medication issue? Pharmacy is calling because the prescription say 5mg  but take twice daily. Patient was already take 10mg  daily. Calling to see if there was an error. Please advise

## 2024-06-27 NOTE — Telephone Encounter (Signed)
 Spoke with Alan from [facility], who called for clarification regarding a prescription for amlodipine  5 mg BID received yesterday. Pharmacy was informed that, upon chart review on 06/26/24, Medford Meager, NP, recommended the patient reduce amlodipine  to 5 mg daily. Pharmacy was informed that an updated prescription will be sent.

## 2024-07-16 ENCOUNTER — Ambulatory Visit

## 2024-07-25 ENCOUNTER — Ambulatory Visit

## 2024-08-12 ENCOUNTER — Ambulatory Visit

## 2024-09-25 ENCOUNTER — Ambulatory Visit: Admitting: Nurse Practitioner
